# Patient Record
Sex: Male | Born: 1950
Health system: Southern US, Community
[De-identification: ages and names within clinical notes are randomized; demographics above are authoritative.]

## PROBLEM LIST (undated history)

## (undated) DIAGNOSIS — Z720 Tobacco use: Secondary | ICD-10-CM

## (undated) DIAGNOSIS — F419 Anxiety disorder, unspecified: Secondary | ICD-10-CM

## (undated) DIAGNOSIS — R569 Unspecified convulsions: Secondary | ICD-10-CM

## (undated) DIAGNOSIS — I10 Essential (primary) hypertension: Secondary | ICD-10-CM

## (undated) DIAGNOSIS — J449 Chronic obstructive pulmonary disease, unspecified: Secondary | ICD-10-CM

## (undated) DIAGNOSIS — I639 Cerebral infarction, unspecified: Secondary | ICD-10-CM

## (undated) DIAGNOSIS — F329 Major depressive disorder, single episode, unspecified: Secondary | ICD-10-CM

## (undated) DIAGNOSIS — F32A Depression, unspecified: Secondary | ICD-10-CM

## (undated) HISTORY — PX: LUNG REMOVAL, PARTIAL: SHX233

## (undated) HISTORY — PX: BRAIN SURGERY: SHX531

---

## 2013-06-14 DIAGNOSIS — F0390 Unspecified dementia without behavioral disturbance: Secondary | ICD-10-CM | POA: Diagnosis not present

## 2013-07-16 DIAGNOSIS — D509 Iron deficiency anemia, unspecified: Secondary | ICD-10-CM | POA: Diagnosis not present

## 2013-07-16 DIAGNOSIS — K219 Gastro-esophageal reflux disease without esophagitis: Secondary | ICD-10-CM | POA: Diagnosis not present

## 2013-07-16 DIAGNOSIS — R609 Edema, unspecified: Secondary | ICD-10-CM | POA: Diagnosis not present

## 2013-07-16 DIAGNOSIS — F329 Major depressive disorder, single episode, unspecified: Secondary | ICD-10-CM | POA: Diagnosis not present

## 2013-07-16 DIAGNOSIS — F411 Generalized anxiety disorder: Secondary | ICD-10-CM | POA: Diagnosis not present

## 2013-07-16 DIAGNOSIS — G4700 Insomnia, unspecified: Secondary | ICD-10-CM | POA: Diagnosis not present

## 2013-08-17 DIAGNOSIS — K219 Gastro-esophageal reflux disease without esophagitis: Secondary | ICD-10-CM | POA: Diagnosis not present

## 2013-08-17 DIAGNOSIS — G4700 Insomnia, unspecified: Secondary | ICD-10-CM | POA: Diagnosis not present

## 2013-08-17 DIAGNOSIS — D509 Iron deficiency anemia, unspecified: Secondary | ICD-10-CM | POA: Diagnosis not present

## 2013-08-17 DIAGNOSIS — F411 Generalized anxiety disorder: Secondary | ICD-10-CM | POA: Diagnosis not present

## 2013-08-17 DIAGNOSIS — T65291A Toxic effect of other tobacco and nicotine, accidental (unintentional), initial encounter: Secondary | ICD-10-CM | POA: Diagnosis not present

## 2013-08-17 DIAGNOSIS — J309 Allergic rhinitis, unspecified: Secondary | ICD-10-CM | POA: Diagnosis not present

## 2013-08-17 DIAGNOSIS — F329 Major depressive disorder, single episode, unspecified: Secondary | ICD-10-CM | POA: Diagnosis not present

## 2013-08-17 DIAGNOSIS — J449 Chronic obstructive pulmonary disease, unspecified: Secondary | ICD-10-CM | POA: Diagnosis not present

## 2013-10-12 DIAGNOSIS — R5381 Other malaise: Secondary | ICD-10-CM | POA: Diagnosis not present

## 2013-10-12 DIAGNOSIS — R5383 Other fatigue: Secondary | ICD-10-CM | POA: Diagnosis not present

## 2013-12-31 DIAGNOSIS — M2550 Pain in unspecified joint: Secondary | ICD-10-CM | POA: Diagnosis not present

## 2013-12-31 DIAGNOSIS — J449 Chronic obstructive pulmonary disease, unspecified: Secondary | ICD-10-CM | POA: Diagnosis not present

## 2014-02-11 DIAGNOSIS — J449 Chronic obstructive pulmonary disease, unspecified: Secondary | ICD-10-CM | POA: Diagnosis not present

## 2014-03-01 DIAGNOSIS — J449 Chronic obstructive pulmonary disease, unspecified: Secondary | ICD-10-CM | POA: Diagnosis not present

## 2014-03-01 DIAGNOSIS — Z8709 Personal history of other diseases of the respiratory system: Secondary | ICD-10-CM | POA: Diagnosis not present

## 2014-03-01 DIAGNOSIS — F419 Anxiety disorder, unspecified: Secondary | ICD-10-CM | POA: Diagnosis not present

## 2014-03-01 DIAGNOSIS — R0602 Shortness of breath: Secondary | ICD-10-CM | POA: Diagnosis not present

## 2014-03-01 DIAGNOSIS — R0981 Nasal congestion: Secondary | ICD-10-CM | POA: Diagnosis not present

## 2014-03-01 DIAGNOSIS — I10 Essential (primary) hypertension: Secondary | ICD-10-CM | POA: Diagnosis not present

## 2014-03-01 DIAGNOSIS — F17210 Nicotine dependence, cigarettes, uncomplicated: Secondary | ICD-10-CM | POA: Diagnosis not present

## 2014-03-30 DIAGNOSIS — R0600 Dyspnea, unspecified: Secondary | ICD-10-CM | POA: Diagnosis not present

## 2014-03-30 DIAGNOSIS — Z9981 Dependence on supplemental oxygen: Secondary | ICD-10-CM | POA: Diagnosis not present

## 2014-03-30 DIAGNOSIS — I10 Essential (primary) hypertension: Secondary | ICD-10-CM | POA: Diagnosis not present

## 2014-03-30 DIAGNOSIS — Z7951 Long term (current) use of inhaled steroids: Secondary | ICD-10-CM | POA: Diagnosis not present

## 2014-03-30 DIAGNOSIS — J439 Emphysema, unspecified: Secondary | ICD-10-CM | POA: Diagnosis not present

## 2014-03-30 DIAGNOSIS — F419 Anxiety disorder, unspecified: Secondary | ICD-10-CM | POA: Diagnosis not present

## 2014-03-30 DIAGNOSIS — K219 Gastro-esophageal reflux disease without esophagitis: Secondary | ICD-10-CM | POA: Diagnosis not present

## 2014-03-30 DIAGNOSIS — R0981 Nasal congestion: Secondary | ICD-10-CM | POA: Diagnosis not present

## 2014-03-30 DIAGNOSIS — R0602 Shortness of breath: Secondary | ICD-10-CM | POA: Diagnosis not present

## 2014-03-30 DIAGNOSIS — J441 Chronic obstructive pulmonary disease with (acute) exacerbation: Secondary | ICD-10-CM | POA: Diagnosis not present

## 2014-03-30 DIAGNOSIS — F17210 Nicotine dependence, cigarettes, uncomplicated: Secondary | ICD-10-CM | POA: Diagnosis not present

## 2014-03-31 DIAGNOSIS — I10 Essential (primary) hypertension: Secondary | ICD-10-CM | POA: Diagnosis not present

## 2014-03-31 DIAGNOSIS — J441 Chronic obstructive pulmonary disease with (acute) exacerbation: Secondary | ICD-10-CM | POA: Diagnosis not present

## 2014-03-31 DIAGNOSIS — K219 Gastro-esophageal reflux disease without esophagitis: Secondary | ICD-10-CM | POA: Diagnosis not present

## 2014-03-31 DIAGNOSIS — J439 Emphysema, unspecified: Secondary | ICD-10-CM | POA: Diagnosis not present

## 2014-03-31 DIAGNOSIS — J189 Pneumonia, unspecified organism: Secondary | ICD-10-CM | POA: Diagnosis not present

## 2014-04-01 DIAGNOSIS — K219 Gastro-esophageal reflux disease without esophagitis: Secondary | ICD-10-CM | POA: Diagnosis not present

## 2014-04-01 DIAGNOSIS — I10 Essential (primary) hypertension: Secondary | ICD-10-CM | POA: Diagnosis not present

## 2014-04-01 DIAGNOSIS — J441 Chronic obstructive pulmonary disease with (acute) exacerbation: Secondary | ICD-10-CM | POA: Diagnosis not present

## 2014-04-12 DIAGNOSIS — M2550 Pain in unspecified joint: Secondary | ICD-10-CM | POA: Diagnosis not present

## 2014-04-12 DIAGNOSIS — I10 Essential (primary) hypertension: Secondary | ICD-10-CM | POA: Diagnosis not present

## 2014-04-12 DIAGNOSIS — Z720 Tobacco use: Secondary | ICD-10-CM | POA: Diagnosis not present

## 2014-04-12 DIAGNOSIS — J449 Chronic obstructive pulmonary disease, unspecified: Secondary | ICD-10-CM | POA: Diagnosis not present

## 2014-04-16 DIAGNOSIS — Z550 Illiteracy and low-level literacy: Secondary | ICD-10-CM | POA: Diagnosis not present

## 2014-04-16 DIAGNOSIS — F419 Anxiety disorder, unspecified: Secondary | ICD-10-CM | POA: Diagnosis not present

## 2014-04-16 DIAGNOSIS — I10 Essential (primary) hypertension: Secondary | ICD-10-CM | POA: Diagnosis not present

## 2014-04-16 DIAGNOSIS — F17210 Nicotine dependence, cigarettes, uncomplicated: Secondary | ICD-10-CM | POA: Diagnosis not present

## 2014-04-16 DIAGNOSIS — Z9981 Dependence on supplemental oxygen: Secondary | ICD-10-CM | POA: Diagnosis not present

## 2014-04-16 DIAGNOSIS — Z8673 Personal history of transient ischemic attack (TIA), and cerebral infarction without residual deficits: Secondary | ICD-10-CM | POA: Diagnosis not present

## 2014-04-16 DIAGNOSIS — M545 Low back pain: Secondary | ICD-10-CM | POA: Diagnosis not present

## 2014-04-16 DIAGNOSIS — R079 Chest pain, unspecified: Secondary | ICD-10-CM | POA: Diagnosis not present

## 2014-04-16 DIAGNOSIS — J449 Chronic obstructive pulmonary disease, unspecified: Secondary | ICD-10-CM | POA: Diagnosis not present

## 2014-04-16 DIAGNOSIS — Z8249 Family history of ischemic heart disease and other diseases of the circulatory system: Secondary | ICD-10-CM | POA: Diagnosis not present

## 2014-04-30 DIAGNOSIS — F17210 Nicotine dependence, cigarettes, uncomplicated: Secondary | ICD-10-CM | POA: Diagnosis not present

## 2014-04-30 DIAGNOSIS — J449 Chronic obstructive pulmonary disease, unspecified: Secondary | ICD-10-CM | POA: Diagnosis not present

## 2014-04-30 DIAGNOSIS — Z9981 Dependence on supplemental oxygen: Secondary | ICD-10-CM | POA: Diagnosis not present

## 2014-04-30 DIAGNOSIS — I10 Essential (primary) hypertension: Secondary | ICD-10-CM | POA: Diagnosis not present

## 2014-04-30 DIAGNOSIS — M546 Pain in thoracic spine: Secondary | ICD-10-CM | POA: Diagnosis not present

## 2014-04-30 DIAGNOSIS — I4510 Unspecified right bundle-branch block: Secondary | ICD-10-CM | POA: Diagnosis not present

## 2014-04-30 DIAGNOSIS — J441 Chronic obstructive pulmonary disease with (acute) exacerbation: Secondary | ICD-10-CM | POA: Diagnosis not present

## 2014-04-30 DIAGNOSIS — R05 Cough: Secondary | ICD-10-CM | POA: Diagnosis not present

## 2014-05-29 DIAGNOSIS — J449 Chronic obstructive pulmonary disease, unspecified: Secondary | ICD-10-CM | POA: Diagnosis not present

## 2014-05-29 DIAGNOSIS — Z7951 Long term (current) use of inhaled steroids: Secondary | ICD-10-CM | POA: Diagnosis not present

## 2014-05-29 DIAGNOSIS — R0600 Dyspnea, unspecified: Secondary | ICD-10-CM | POA: Diagnosis not present

## 2014-05-29 DIAGNOSIS — I10 Essential (primary) hypertension: Secondary | ICD-10-CM | POA: Diagnosis not present

## 2014-05-29 DIAGNOSIS — Z9981 Dependence on supplemental oxygen: Secondary | ICD-10-CM | POA: Diagnosis not present

## 2014-05-29 DIAGNOSIS — F410 Panic disorder [episodic paroxysmal anxiety] without agoraphobia: Secondary | ICD-10-CM | POA: Diagnosis not present

## 2014-05-29 DIAGNOSIS — J441 Chronic obstructive pulmonary disease with (acute) exacerbation: Secondary | ICD-10-CM | POA: Diagnosis not present

## 2014-05-29 DIAGNOSIS — R531 Weakness: Secondary | ICD-10-CM | POA: Diagnosis not present

## 2014-05-29 DIAGNOSIS — F17210 Nicotine dependence, cigarettes, uncomplicated: Secondary | ICD-10-CM | POA: Diagnosis not present

## 2014-05-29 DIAGNOSIS — R0689 Other abnormalities of breathing: Secondary | ICD-10-CM | POA: Diagnosis not present

## 2014-05-29 DIAGNOSIS — R062 Wheezing: Secondary | ICD-10-CM | POA: Diagnosis not present

## 2014-06-12 DIAGNOSIS — E871 Hypo-osmolality and hyponatremia: Secondary | ICD-10-CM | POA: Diagnosis not present

## 2014-06-12 DIAGNOSIS — J9809 Other diseases of bronchus, not elsewhere classified: Secondary | ICD-10-CM | POA: Diagnosis not present

## 2014-06-12 DIAGNOSIS — J449 Chronic obstructive pulmonary disease, unspecified: Secondary | ICD-10-CM | POA: Diagnosis not present

## 2014-06-12 DIAGNOSIS — I2510 Atherosclerotic heart disease of native coronary artery without angina pectoris: Secondary | ICD-10-CM | POA: Diagnosis not present

## 2014-06-12 DIAGNOSIS — R627 Adult failure to thrive: Secondary | ICD-10-CM | POA: Diagnosis not present

## 2014-06-12 DIAGNOSIS — D509 Iron deficiency anemia, unspecified: Secondary | ICD-10-CM | POA: Diagnosis not present

## 2014-06-12 DIAGNOSIS — J984 Other disorders of lung: Secondary | ICD-10-CM | POA: Diagnosis not present

## 2014-06-12 DIAGNOSIS — R079 Chest pain, unspecified: Secondary | ICD-10-CM | POA: Diagnosis not present

## 2014-06-12 DIAGNOSIS — R262 Difficulty in walking, not elsewhere classified: Secondary | ICD-10-CM | POA: Diagnosis not present

## 2014-06-12 DIAGNOSIS — I10 Essential (primary) hypertension: Secondary | ICD-10-CM | POA: Diagnosis present

## 2014-06-12 DIAGNOSIS — J9690 Respiratory failure, unspecified, unspecified whether with hypoxia or hypercapnia: Secondary | ICD-10-CM | POA: Diagnosis not present

## 2014-06-12 DIAGNOSIS — J9620 Acute and chronic respiratory failure, unspecified whether with hypoxia or hypercapnia: Secondary | ICD-10-CM | POA: Diagnosis not present

## 2014-06-12 DIAGNOSIS — J180 Bronchopneumonia, unspecified organism: Secondary | ICD-10-CM | POA: Diagnosis not present

## 2014-06-12 DIAGNOSIS — R0602 Shortness of breath: Secondary | ICD-10-CM | POA: Diagnosis not present

## 2014-06-12 DIAGNOSIS — M6281 Muscle weakness (generalized): Secondary | ICD-10-CM | POA: Diagnosis not present

## 2014-06-12 DIAGNOSIS — Z79899 Other long term (current) drug therapy: Secondary | ICD-10-CM | POA: Diagnosis not present

## 2014-06-12 DIAGNOSIS — Z7982 Long term (current) use of aspirin: Secondary | ICD-10-CM | POA: Diagnosis not present

## 2014-06-12 DIAGNOSIS — R451 Restlessness and agitation: Secondary | ICD-10-CM | POA: Diagnosis present

## 2014-06-12 DIAGNOSIS — J9602 Acute respiratory failure with hypercapnia: Secondary | ICD-10-CM | POA: Diagnosis not present

## 2014-06-12 DIAGNOSIS — M1990 Unspecified osteoarthritis, unspecified site: Secondary | ICD-10-CM | POA: Diagnosis present

## 2014-06-12 DIAGNOSIS — J441 Chronic obstructive pulmonary disease with (acute) exacerbation: Secondary | ICD-10-CM | POA: Diagnosis not present

## 2014-06-12 DIAGNOSIS — R531 Weakness: Secondary | ICD-10-CM | POA: Diagnosis not present

## 2014-06-12 DIAGNOSIS — F17210 Nicotine dependence, cigarettes, uncomplicated: Secondary | ICD-10-CM | POA: Diagnosis not present

## 2014-06-12 DIAGNOSIS — K219 Gastro-esophageal reflux disease without esophagitis: Secondary | ICD-10-CM | POA: Diagnosis present

## 2014-06-12 DIAGNOSIS — R0600 Dyspnea, unspecified: Secondary | ICD-10-CM | POA: Diagnosis not present

## 2014-06-12 DIAGNOSIS — Z8673 Personal history of transient ischemic attack (TIA), and cerebral infarction without residual deficits: Secondary | ICD-10-CM | POA: Diagnosis not present

## 2014-06-17 DIAGNOSIS — I10 Essential (primary) hypertension: Secondary | ICD-10-CM | POA: Diagnosis not present

## 2014-06-17 DIAGNOSIS — R262 Difficulty in walking, not elsewhere classified: Secondary | ICD-10-CM | POA: Diagnosis not present

## 2014-06-17 DIAGNOSIS — K5900 Constipation, unspecified: Secondary | ICD-10-CM | POA: Diagnosis not present

## 2014-06-17 DIAGNOSIS — K219 Gastro-esophageal reflux disease without esophagitis: Secondary | ICD-10-CM | POA: Diagnosis not present

## 2014-06-17 DIAGNOSIS — J449 Chronic obstructive pulmonary disease, unspecified: Secondary | ICD-10-CM | POA: Diagnosis not present

## 2014-06-17 DIAGNOSIS — Z792 Long term (current) use of antibiotics: Secondary | ICD-10-CM | POA: Diagnosis not present

## 2014-06-17 DIAGNOSIS — R531 Weakness: Secondary | ICD-10-CM | POA: Diagnosis not present

## 2014-06-17 DIAGNOSIS — R0789 Other chest pain: Secondary | ICD-10-CM | POA: Diagnosis not present

## 2014-06-17 DIAGNOSIS — F17210 Nicotine dependence, cigarettes, uncomplicated: Secondary | ICD-10-CM | POA: Diagnosis not present

## 2014-06-17 DIAGNOSIS — G4739 Other sleep apnea: Secondary | ICD-10-CM | POA: Diagnosis not present

## 2014-06-17 DIAGNOSIS — R0600 Dyspnea, unspecified: Secondary | ICD-10-CM | POA: Diagnosis not present

## 2014-06-17 DIAGNOSIS — R451 Restlessness and agitation: Secondary | ICD-10-CM | POA: Diagnosis not present

## 2014-06-17 DIAGNOSIS — D649 Anemia, unspecified: Secondary | ICD-10-CM | POA: Diagnosis not present

## 2014-06-17 DIAGNOSIS — Z7952 Long term (current) use of systemic steroids: Secondary | ICD-10-CM | POA: Diagnosis not present

## 2014-06-17 DIAGNOSIS — R079 Chest pain, unspecified: Secondary | ICD-10-CM | POA: Diagnosis not present

## 2014-06-17 DIAGNOSIS — Z7982 Long term (current) use of aspirin: Secondary | ICD-10-CM | POA: Diagnosis not present

## 2014-06-17 DIAGNOSIS — M6281 Muscle weakness (generalized): Secondary | ICD-10-CM | POA: Diagnosis not present

## 2014-06-17 DIAGNOSIS — D72829 Elevated white blood cell count, unspecified: Secondary | ICD-10-CM | POA: Diagnosis not present

## 2014-06-17 DIAGNOSIS — J9602 Acute respiratory failure with hypercapnia: Secondary | ICD-10-CM | POA: Diagnosis not present

## 2014-06-17 DIAGNOSIS — J9620 Acute and chronic respiratory failure, unspecified whether with hypoxia or hypercapnia: Secondary | ICD-10-CM | POA: Diagnosis not present

## 2014-06-17 DIAGNOSIS — R627 Adult failure to thrive: Secondary | ICD-10-CM | POA: Diagnosis not present

## 2014-06-17 DIAGNOSIS — J9690 Respiratory failure, unspecified, unspecified whether with hypoxia or hypercapnia: Secondary | ICD-10-CM | POA: Diagnosis not present

## 2014-06-17 DIAGNOSIS — J441 Chronic obstructive pulmonary disease with (acute) exacerbation: Secondary | ICD-10-CM | POA: Diagnosis not present

## 2014-06-17 DIAGNOSIS — Z8673 Personal history of transient ischemic attack (TIA), and cerebral infarction without residual deficits: Secondary | ICD-10-CM | POA: Diagnosis not present

## 2014-06-17 DIAGNOSIS — J180 Bronchopneumonia, unspecified organism: Secondary | ICD-10-CM | POA: Diagnosis not present

## 2014-06-17 DIAGNOSIS — D509 Iron deficiency anemia, unspecified: Secondary | ICD-10-CM | POA: Diagnosis not present

## 2014-06-17 DIAGNOSIS — E871 Hypo-osmolality and hyponatremia: Secondary | ICD-10-CM | POA: Diagnosis not present

## 2014-06-17 DIAGNOSIS — Z79899 Other long term (current) drug therapy: Secondary | ICD-10-CM | POA: Diagnosis not present

## 2014-06-17 DIAGNOSIS — J45909 Unspecified asthma, uncomplicated: Secondary | ICD-10-CM | POA: Diagnosis not present

## 2014-06-21 DIAGNOSIS — I10 Essential (primary) hypertension: Secondary | ICD-10-CM | POA: Diagnosis not present

## 2014-06-21 DIAGNOSIS — R079 Chest pain, unspecified: Secondary | ICD-10-CM | POA: Diagnosis not present

## 2014-06-21 DIAGNOSIS — R0789 Other chest pain: Secondary | ICD-10-CM | POA: Diagnosis not present

## 2014-06-21 DIAGNOSIS — Z792 Long term (current) use of antibiotics: Secondary | ICD-10-CM | POA: Diagnosis not present

## 2014-06-21 DIAGNOSIS — J45909 Unspecified asthma, uncomplicated: Secondary | ICD-10-CM | POA: Diagnosis not present

## 2014-06-21 DIAGNOSIS — Z7982 Long term (current) use of aspirin: Secondary | ICD-10-CM | POA: Diagnosis not present

## 2014-06-21 DIAGNOSIS — F17210 Nicotine dependence, cigarettes, uncomplicated: Secondary | ICD-10-CM | POA: Diagnosis not present

## 2014-06-30 DIAGNOSIS — D72829 Elevated white blood cell count, unspecified: Secondary | ICD-10-CM | POA: Diagnosis not present

## 2014-06-30 DIAGNOSIS — D509 Iron deficiency anemia, unspecified: Secondary | ICD-10-CM | POA: Diagnosis not present

## 2014-07-01 DIAGNOSIS — K5900 Constipation, unspecified: Secondary | ICD-10-CM | POA: Diagnosis not present

## 2014-07-07 DIAGNOSIS — K219 Gastro-esophageal reflux disease without esophagitis: Secondary | ICD-10-CM | POA: Diagnosis not present

## 2014-07-07 DIAGNOSIS — J449 Chronic obstructive pulmonary disease, unspecified: Secondary | ICD-10-CM | POA: Diagnosis not present

## 2014-07-07 DIAGNOSIS — G4739 Other sleep apnea: Secondary | ICD-10-CM | POA: Diagnosis not present

## 2014-07-07 DIAGNOSIS — D509 Iron deficiency anemia, unspecified: Secondary | ICD-10-CM | POA: Diagnosis not present

## 2014-07-10 DIAGNOSIS — R627 Adult failure to thrive: Secondary | ICD-10-CM | POA: Diagnosis not present

## 2014-07-10 DIAGNOSIS — Z7952 Long term (current) use of systemic steroids: Secondary | ICD-10-CM | POA: Diagnosis not present

## 2014-07-10 DIAGNOSIS — J180 Bronchopneumonia, unspecified organism: Secondary | ICD-10-CM | POA: Diagnosis not present

## 2014-07-10 DIAGNOSIS — I2510 Atherosclerotic heart disease of native coronary artery without angina pectoris: Secondary | ICD-10-CM | POA: Diagnosis not present

## 2014-07-10 DIAGNOSIS — Z87891 Personal history of nicotine dependence: Secondary | ICD-10-CM | POA: Diagnosis not present

## 2014-07-10 DIAGNOSIS — Z9981 Dependence on supplemental oxygen: Secondary | ICD-10-CM | POA: Diagnosis not present

## 2014-07-10 DIAGNOSIS — J441 Chronic obstructive pulmonary disease with (acute) exacerbation: Secondary | ICD-10-CM | POA: Diagnosis not present

## 2014-07-12 DIAGNOSIS — Z9981 Dependence on supplemental oxygen: Secondary | ICD-10-CM | POA: Diagnosis not present

## 2014-07-12 DIAGNOSIS — I2510 Atherosclerotic heart disease of native coronary artery without angina pectoris: Secondary | ICD-10-CM | POA: Diagnosis not present

## 2014-07-12 DIAGNOSIS — R627 Adult failure to thrive: Secondary | ICD-10-CM | POA: Diagnosis not present

## 2014-07-12 DIAGNOSIS — J441 Chronic obstructive pulmonary disease with (acute) exacerbation: Secondary | ICD-10-CM | POA: Diagnosis not present

## 2014-07-12 DIAGNOSIS — Z7952 Long term (current) use of systemic steroids: Secondary | ICD-10-CM | POA: Diagnosis not present

## 2014-07-12 DIAGNOSIS — J180 Bronchopneumonia, unspecified organism: Secondary | ICD-10-CM | POA: Diagnosis not present

## 2014-07-13 DIAGNOSIS — I2510 Atherosclerotic heart disease of native coronary artery without angina pectoris: Secondary | ICD-10-CM | POA: Diagnosis not present

## 2014-07-13 DIAGNOSIS — Z9981 Dependence on supplemental oxygen: Secondary | ICD-10-CM | POA: Diagnosis not present

## 2014-07-13 DIAGNOSIS — J441 Chronic obstructive pulmonary disease with (acute) exacerbation: Secondary | ICD-10-CM | POA: Diagnosis not present

## 2014-07-13 DIAGNOSIS — J180 Bronchopneumonia, unspecified organism: Secondary | ICD-10-CM | POA: Diagnosis not present

## 2014-07-13 DIAGNOSIS — Z7952 Long term (current) use of systemic steroids: Secondary | ICD-10-CM | POA: Diagnosis not present

## 2014-07-13 DIAGNOSIS — R627 Adult failure to thrive: Secondary | ICD-10-CM | POA: Diagnosis not present

## 2014-07-14 DIAGNOSIS — J180 Bronchopneumonia, unspecified organism: Secondary | ICD-10-CM | POA: Diagnosis not present

## 2014-07-14 DIAGNOSIS — Z7952 Long term (current) use of systemic steroids: Secondary | ICD-10-CM | POA: Diagnosis not present

## 2014-07-14 DIAGNOSIS — J441 Chronic obstructive pulmonary disease with (acute) exacerbation: Secondary | ICD-10-CM | POA: Diagnosis not present

## 2014-07-14 DIAGNOSIS — R627 Adult failure to thrive: Secondary | ICD-10-CM | POA: Diagnosis not present

## 2014-07-14 DIAGNOSIS — I2510 Atherosclerotic heart disease of native coronary artery without angina pectoris: Secondary | ICD-10-CM | POA: Diagnosis not present

## 2014-07-14 DIAGNOSIS — Z9981 Dependence on supplemental oxygen: Secondary | ICD-10-CM | POA: Diagnosis not present

## 2014-07-16 DIAGNOSIS — J180 Bronchopneumonia, unspecified organism: Secondary | ICD-10-CM | POA: Diagnosis not present

## 2014-07-16 DIAGNOSIS — J441 Chronic obstructive pulmonary disease with (acute) exacerbation: Secondary | ICD-10-CM | POA: Diagnosis not present

## 2014-07-16 DIAGNOSIS — Z7952 Long term (current) use of systemic steroids: Secondary | ICD-10-CM | POA: Diagnosis not present

## 2014-07-16 DIAGNOSIS — R627 Adult failure to thrive: Secondary | ICD-10-CM | POA: Diagnosis not present

## 2014-07-16 DIAGNOSIS — Z9981 Dependence on supplemental oxygen: Secondary | ICD-10-CM | POA: Diagnosis not present

## 2014-07-16 DIAGNOSIS — I2510 Atherosclerotic heart disease of native coronary artery without angina pectoris: Secondary | ICD-10-CM | POA: Diagnosis not present

## 2014-07-19 DIAGNOSIS — Z7952 Long term (current) use of systemic steroids: Secondary | ICD-10-CM | POA: Diagnosis not present

## 2014-07-19 DIAGNOSIS — Z9981 Dependence on supplemental oxygen: Secondary | ICD-10-CM | POA: Diagnosis not present

## 2014-07-19 DIAGNOSIS — R627 Adult failure to thrive: Secondary | ICD-10-CM | POA: Diagnosis not present

## 2014-07-19 DIAGNOSIS — I2510 Atherosclerotic heart disease of native coronary artery without angina pectoris: Secondary | ICD-10-CM | POA: Diagnosis not present

## 2014-07-19 DIAGNOSIS — J180 Bronchopneumonia, unspecified organism: Secondary | ICD-10-CM | POA: Diagnosis not present

## 2014-07-19 DIAGNOSIS — J441 Chronic obstructive pulmonary disease with (acute) exacerbation: Secondary | ICD-10-CM | POA: Diagnosis not present

## 2014-07-20 DIAGNOSIS — J180 Bronchopneumonia, unspecified organism: Secondary | ICD-10-CM | POA: Diagnosis not present

## 2014-07-20 DIAGNOSIS — I2510 Atherosclerotic heart disease of native coronary artery without angina pectoris: Secondary | ICD-10-CM | POA: Diagnosis not present

## 2014-07-20 DIAGNOSIS — J441 Chronic obstructive pulmonary disease with (acute) exacerbation: Secondary | ICD-10-CM | POA: Diagnosis not present

## 2014-07-20 DIAGNOSIS — Z7952 Long term (current) use of systemic steroids: Secondary | ICD-10-CM | POA: Diagnosis not present

## 2014-07-20 DIAGNOSIS — Z9981 Dependence on supplemental oxygen: Secondary | ICD-10-CM | POA: Diagnosis not present

## 2014-07-20 DIAGNOSIS — R627 Adult failure to thrive: Secondary | ICD-10-CM | POA: Diagnosis not present

## 2014-07-21 DIAGNOSIS — I2510 Atherosclerotic heart disease of native coronary artery without angina pectoris: Secondary | ICD-10-CM | POA: Diagnosis not present

## 2014-07-21 DIAGNOSIS — Z7952 Long term (current) use of systemic steroids: Secondary | ICD-10-CM | POA: Diagnosis not present

## 2014-07-21 DIAGNOSIS — J180 Bronchopneumonia, unspecified organism: Secondary | ICD-10-CM | POA: Diagnosis not present

## 2014-07-21 DIAGNOSIS — J441 Chronic obstructive pulmonary disease with (acute) exacerbation: Secondary | ICD-10-CM | POA: Diagnosis not present

## 2014-07-21 DIAGNOSIS — R627 Adult failure to thrive: Secondary | ICD-10-CM | POA: Diagnosis not present

## 2014-07-21 DIAGNOSIS — Z9981 Dependence on supplemental oxygen: Secondary | ICD-10-CM | POA: Diagnosis not present

## 2014-07-22 DIAGNOSIS — I2510 Atherosclerotic heart disease of native coronary artery without angina pectoris: Secondary | ICD-10-CM | POA: Diagnosis not present

## 2014-07-22 DIAGNOSIS — J180 Bronchopneumonia, unspecified organism: Secondary | ICD-10-CM | POA: Diagnosis not present

## 2014-07-22 DIAGNOSIS — R627 Adult failure to thrive: Secondary | ICD-10-CM | POA: Diagnosis not present

## 2014-07-22 DIAGNOSIS — Z7952 Long term (current) use of systemic steroids: Secondary | ICD-10-CM | POA: Diagnosis not present

## 2014-07-22 DIAGNOSIS — J441 Chronic obstructive pulmonary disease with (acute) exacerbation: Secondary | ICD-10-CM | POA: Diagnosis not present

## 2014-07-22 DIAGNOSIS — Z9981 Dependence on supplemental oxygen: Secondary | ICD-10-CM | POA: Diagnosis not present

## 2014-07-26 DIAGNOSIS — J449 Chronic obstructive pulmonary disease, unspecified: Secondary | ICD-10-CM | POA: Diagnosis not present

## 2014-07-26 DIAGNOSIS — Z720 Tobacco use: Secondary | ICD-10-CM | POA: Diagnosis not present

## 2014-07-26 DIAGNOSIS — I10 Essential (primary) hypertension: Secondary | ICD-10-CM | POA: Diagnosis not present

## 2014-07-26 DIAGNOSIS — G894 Chronic pain syndrome: Secondary | ICD-10-CM | POA: Diagnosis not present

## 2014-07-27 DIAGNOSIS — Z9981 Dependence on supplemental oxygen: Secondary | ICD-10-CM | POA: Diagnosis not present

## 2014-07-27 DIAGNOSIS — J180 Bronchopneumonia, unspecified organism: Secondary | ICD-10-CM | POA: Diagnosis not present

## 2014-07-27 DIAGNOSIS — R627 Adult failure to thrive: Secondary | ICD-10-CM | POA: Diagnosis not present

## 2014-07-27 DIAGNOSIS — I2510 Atherosclerotic heart disease of native coronary artery without angina pectoris: Secondary | ICD-10-CM | POA: Diagnosis not present

## 2014-07-27 DIAGNOSIS — Z7952 Long term (current) use of systemic steroids: Secondary | ICD-10-CM | POA: Diagnosis not present

## 2014-07-27 DIAGNOSIS — J441 Chronic obstructive pulmonary disease with (acute) exacerbation: Secondary | ICD-10-CM | POA: Diagnosis not present

## 2014-07-28 DIAGNOSIS — R627 Adult failure to thrive: Secondary | ICD-10-CM | POA: Diagnosis not present

## 2014-07-28 DIAGNOSIS — J180 Bronchopneumonia, unspecified organism: Secondary | ICD-10-CM | POA: Diagnosis not present

## 2014-07-28 DIAGNOSIS — I2510 Atherosclerotic heart disease of native coronary artery without angina pectoris: Secondary | ICD-10-CM | POA: Diagnosis not present

## 2014-07-28 DIAGNOSIS — Z9981 Dependence on supplemental oxygen: Secondary | ICD-10-CM | POA: Diagnosis not present

## 2014-07-28 DIAGNOSIS — Z7952 Long term (current) use of systemic steroids: Secondary | ICD-10-CM | POA: Diagnosis not present

## 2014-07-28 DIAGNOSIS — J441 Chronic obstructive pulmonary disease with (acute) exacerbation: Secondary | ICD-10-CM | POA: Diagnosis not present

## 2014-07-29 DIAGNOSIS — J441 Chronic obstructive pulmonary disease with (acute) exacerbation: Secondary | ICD-10-CM | POA: Diagnosis not present

## 2014-07-29 DIAGNOSIS — J180 Bronchopneumonia, unspecified organism: Secondary | ICD-10-CM | POA: Diagnosis not present

## 2014-07-29 DIAGNOSIS — R627 Adult failure to thrive: Secondary | ICD-10-CM | POA: Diagnosis not present

## 2014-07-29 DIAGNOSIS — I2510 Atherosclerotic heart disease of native coronary artery without angina pectoris: Secondary | ICD-10-CM | POA: Diagnosis not present

## 2014-07-29 DIAGNOSIS — Z7952 Long term (current) use of systemic steroids: Secondary | ICD-10-CM | POA: Diagnosis not present

## 2014-07-29 DIAGNOSIS — Z9981 Dependence on supplemental oxygen: Secondary | ICD-10-CM | POA: Diagnosis not present

## 2014-08-03 DIAGNOSIS — Z9981 Dependence on supplemental oxygen: Secondary | ICD-10-CM | POA: Diagnosis not present

## 2014-08-03 DIAGNOSIS — Z7952 Long term (current) use of systemic steroids: Secondary | ICD-10-CM | POA: Diagnosis not present

## 2014-08-03 DIAGNOSIS — J441 Chronic obstructive pulmonary disease with (acute) exacerbation: Secondary | ICD-10-CM | POA: Diagnosis not present

## 2014-08-03 DIAGNOSIS — I2510 Atherosclerotic heart disease of native coronary artery without angina pectoris: Secondary | ICD-10-CM | POA: Diagnosis not present

## 2014-08-03 DIAGNOSIS — J180 Bronchopneumonia, unspecified organism: Secondary | ICD-10-CM | POA: Diagnosis not present

## 2014-08-03 DIAGNOSIS — R627 Adult failure to thrive: Secondary | ICD-10-CM | POA: Diagnosis not present

## 2014-08-05 DIAGNOSIS — J441 Chronic obstructive pulmonary disease with (acute) exacerbation: Secondary | ICD-10-CM | POA: Diagnosis not present

## 2014-08-05 DIAGNOSIS — R627 Adult failure to thrive: Secondary | ICD-10-CM | POA: Diagnosis not present

## 2014-08-05 DIAGNOSIS — J180 Bronchopneumonia, unspecified organism: Secondary | ICD-10-CM | POA: Diagnosis not present

## 2014-08-05 DIAGNOSIS — Z9981 Dependence on supplemental oxygen: Secondary | ICD-10-CM | POA: Diagnosis not present

## 2014-08-05 DIAGNOSIS — I2510 Atherosclerotic heart disease of native coronary artery without angina pectoris: Secondary | ICD-10-CM | POA: Diagnosis not present

## 2014-08-05 DIAGNOSIS — Z7952 Long term (current) use of systemic steroids: Secondary | ICD-10-CM | POA: Diagnosis not present

## 2014-08-07 DIAGNOSIS — D72829 Elevated white blood cell count, unspecified: Secondary | ICD-10-CM | POA: Diagnosis not present

## 2014-08-07 DIAGNOSIS — Z79899 Other long term (current) drug therapy: Secondary | ICD-10-CM | POA: Diagnosis not present

## 2014-08-07 DIAGNOSIS — J439 Emphysema, unspecified: Secondary | ICD-10-CM | POA: Diagnosis not present

## 2014-08-07 DIAGNOSIS — R0602 Shortness of breath: Secondary | ICD-10-CM | POA: Diagnosis not present

## 2014-08-07 DIAGNOSIS — R05 Cough: Secondary | ICD-10-CM | POA: Diagnosis not present

## 2014-08-07 DIAGNOSIS — J45909 Unspecified asthma, uncomplicated: Secondary | ICD-10-CM | POA: Diagnosis not present

## 2014-08-07 DIAGNOSIS — Z452 Encounter for adjustment and management of vascular access device: Secondary | ICD-10-CM | POA: Diagnosis not present

## 2014-08-07 DIAGNOSIS — R918 Other nonspecific abnormal finding of lung field: Secondary | ICD-10-CM | POA: Diagnosis not present

## 2014-08-07 DIAGNOSIS — J9602 Acute respiratory failure with hypercapnia: Secondary | ICD-10-CM | POA: Diagnosis not present

## 2014-08-07 DIAGNOSIS — J449 Chronic obstructive pulmonary disease, unspecified: Secondary | ICD-10-CM | POA: Diagnosis not present

## 2014-08-07 DIAGNOSIS — K219 Gastro-esophageal reflux disease without esophagitis: Secondary | ICD-10-CM | POA: Diagnosis not present

## 2014-08-07 DIAGNOSIS — F17210 Nicotine dependence, cigarettes, uncomplicated: Secondary | ICD-10-CM | POA: Diagnosis present

## 2014-08-07 DIAGNOSIS — I2510 Atherosclerotic heart disease of native coronary artery without angina pectoris: Secondary | ICD-10-CM | POA: Diagnosis not present

## 2014-08-07 DIAGNOSIS — J441 Chronic obstructive pulmonary disease with (acute) exacerbation: Secondary | ICD-10-CM | POA: Diagnosis present

## 2014-08-07 DIAGNOSIS — J9601 Acute respiratory failure with hypoxia: Secondary | ICD-10-CM | POA: Diagnosis not present

## 2014-08-07 DIAGNOSIS — Z716 Tobacco abuse counseling: Secondary | ICD-10-CM | POA: Diagnosis present

## 2014-08-07 DIAGNOSIS — Z8673 Personal history of transient ischemic attack (TIA), and cerebral infarction without residual deficits: Secondary | ICD-10-CM | POA: Diagnosis not present

## 2014-08-12 DIAGNOSIS — J441 Chronic obstructive pulmonary disease with (acute) exacerbation: Secondary | ICD-10-CM | POA: Diagnosis not present

## 2014-08-12 DIAGNOSIS — J180 Bronchopneumonia, unspecified organism: Secondary | ICD-10-CM | POA: Diagnosis not present

## 2014-08-12 DIAGNOSIS — Z9981 Dependence on supplemental oxygen: Secondary | ICD-10-CM | POA: Diagnosis not present

## 2014-08-12 DIAGNOSIS — R627 Adult failure to thrive: Secondary | ICD-10-CM | POA: Diagnosis not present

## 2014-08-12 DIAGNOSIS — I2510 Atherosclerotic heart disease of native coronary artery without angina pectoris: Secondary | ICD-10-CM | POA: Diagnosis not present

## 2014-08-12 DIAGNOSIS — Z7952 Long term (current) use of systemic steroids: Secondary | ICD-10-CM | POA: Diagnosis not present

## 2014-08-17 DIAGNOSIS — J441 Chronic obstructive pulmonary disease with (acute) exacerbation: Secondary | ICD-10-CM | POA: Diagnosis not present

## 2014-08-17 DIAGNOSIS — J180 Bronchopneumonia, unspecified organism: Secondary | ICD-10-CM | POA: Diagnosis not present

## 2014-08-17 DIAGNOSIS — Z9981 Dependence on supplemental oxygen: Secondary | ICD-10-CM | POA: Diagnosis not present

## 2014-08-17 DIAGNOSIS — R627 Adult failure to thrive: Secondary | ICD-10-CM | POA: Diagnosis not present

## 2014-08-17 DIAGNOSIS — I2510 Atherosclerotic heart disease of native coronary artery without angina pectoris: Secondary | ICD-10-CM | POA: Diagnosis not present

## 2014-08-17 DIAGNOSIS — Z7952 Long term (current) use of systemic steroids: Secondary | ICD-10-CM | POA: Diagnosis not present

## 2014-08-19 DIAGNOSIS — Z79899 Other long term (current) drug therapy: Secondary | ICD-10-CM | POA: Diagnosis not present

## 2014-08-19 DIAGNOSIS — J441 Chronic obstructive pulmonary disease with (acute) exacerbation: Secondary | ICD-10-CM | POA: Diagnosis not present

## 2014-08-19 DIAGNOSIS — J45909 Unspecified asthma, uncomplicated: Secondary | ICD-10-CM | POA: Diagnosis not present

## 2014-08-19 DIAGNOSIS — F17200 Nicotine dependence, unspecified, uncomplicated: Secondary | ICD-10-CM | POA: Diagnosis not present

## 2014-08-19 DIAGNOSIS — K219 Gastro-esophageal reflux disease without esophagitis: Secondary | ICD-10-CM | POA: Diagnosis not present

## 2014-08-19 DIAGNOSIS — R0602 Shortness of breath: Secondary | ICD-10-CM | POA: Diagnosis not present

## 2014-08-19 DIAGNOSIS — Z7952 Long term (current) use of systemic steroids: Secondary | ICD-10-CM | POA: Diagnosis not present

## 2014-08-19 DIAGNOSIS — J80 Acute respiratory distress syndrome: Secondary | ICD-10-CM | POA: Diagnosis not present

## 2014-08-19 DIAGNOSIS — J449 Chronic obstructive pulmonary disease, unspecified: Secondary | ICD-10-CM | POA: Diagnosis not present

## 2014-08-19 DIAGNOSIS — F17210 Nicotine dependence, cigarettes, uncomplicated: Secondary | ICD-10-CM | POA: Diagnosis not present

## 2014-08-19 DIAGNOSIS — Z9981 Dependence on supplemental oxygen: Secondary | ICD-10-CM | POA: Diagnosis not present

## 2014-08-19 DIAGNOSIS — K5900 Constipation, unspecified: Secondary | ICD-10-CM | POA: Diagnosis not present

## 2014-08-19 DIAGNOSIS — Z720 Tobacco use: Secondary | ICD-10-CM | POA: Diagnosis not present

## 2014-08-19 DIAGNOSIS — G894 Chronic pain syndrome: Secondary | ICD-10-CM | POA: Diagnosis not present

## 2014-08-19 DIAGNOSIS — F418 Other specified anxiety disorders: Secondary | ICD-10-CM | POA: Diagnosis not present

## 2014-08-19 DIAGNOSIS — I10 Essential (primary) hypertension: Secondary | ICD-10-CM | POA: Diagnosis not present

## 2014-08-20 DIAGNOSIS — Z7952 Long term (current) use of systemic steroids: Secondary | ICD-10-CM | POA: Diagnosis not present

## 2014-08-20 DIAGNOSIS — I2510 Atherosclerotic heart disease of native coronary artery without angina pectoris: Secondary | ICD-10-CM | POA: Diagnosis not present

## 2014-08-20 DIAGNOSIS — Z9981 Dependence on supplemental oxygen: Secondary | ICD-10-CM | POA: Diagnosis not present

## 2014-08-20 DIAGNOSIS — R627 Adult failure to thrive: Secondary | ICD-10-CM | POA: Diagnosis not present

## 2014-08-20 DIAGNOSIS — J180 Bronchopneumonia, unspecified organism: Secondary | ICD-10-CM | POA: Diagnosis not present

## 2014-08-20 DIAGNOSIS — J441 Chronic obstructive pulmonary disease with (acute) exacerbation: Secondary | ICD-10-CM | POA: Diagnosis not present

## 2014-08-23 DIAGNOSIS — R627 Adult failure to thrive: Secondary | ICD-10-CM | POA: Diagnosis not present

## 2014-08-23 DIAGNOSIS — Z7952 Long term (current) use of systemic steroids: Secondary | ICD-10-CM | POA: Diagnosis not present

## 2014-08-23 DIAGNOSIS — I2510 Atherosclerotic heart disease of native coronary artery without angina pectoris: Secondary | ICD-10-CM | POA: Diagnosis not present

## 2014-08-23 DIAGNOSIS — Z9981 Dependence on supplemental oxygen: Secondary | ICD-10-CM | POA: Diagnosis not present

## 2014-08-23 DIAGNOSIS — J180 Bronchopneumonia, unspecified organism: Secondary | ICD-10-CM | POA: Diagnosis not present

## 2014-08-23 DIAGNOSIS — J441 Chronic obstructive pulmonary disease with (acute) exacerbation: Secondary | ICD-10-CM | POA: Diagnosis not present

## 2014-08-25 DIAGNOSIS — Z9981 Dependence on supplemental oxygen: Secondary | ICD-10-CM | POA: Diagnosis not present

## 2014-08-25 DIAGNOSIS — J180 Bronchopneumonia, unspecified organism: Secondary | ICD-10-CM | POA: Diagnosis not present

## 2014-08-25 DIAGNOSIS — R627 Adult failure to thrive: Secondary | ICD-10-CM | POA: Diagnosis not present

## 2014-08-25 DIAGNOSIS — I2510 Atherosclerotic heart disease of native coronary artery without angina pectoris: Secondary | ICD-10-CM | POA: Diagnosis not present

## 2014-08-25 DIAGNOSIS — Z7952 Long term (current) use of systemic steroids: Secondary | ICD-10-CM | POA: Diagnosis not present

## 2014-08-25 DIAGNOSIS — J441 Chronic obstructive pulmonary disease with (acute) exacerbation: Secondary | ICD-10-CM | POA: Diagnosis not present

## 2014-08-27 DIAGNOSIS — Z7952 Long term (current) use of systemic steroids: Secondary | ICD-10-CM | POA: Diagnosis not present

## 2014-08-27 DIAGNOSIS — I2510 Atherosclerotic heart disease of native coronary artery without angina pectoris: Secondary | ICD-10-CM | POA: Diagnosis not present

## 2014-08-27 DIAGNOSIS — R300 Dysuria: Secondary | ICD-10-CM | POA: Diagnosis not present

## 2014-08-27 DIAGNOSIS — J180 Bronchopneumonia, unspecified organism: Secondary | ICD-10-CM | POA: Diagnosis not present

## 2014-08-27 DIAGNOSIS — Z9981 Dependence on supplemental oxygen: Secondary | ICD-10-CM | POA: Diagnosis not present

## 2014-08-27 DIAGNOSIS — J441 Chronic obstructive pulmonary disease with (acute) exacerbation: Secondary | ICD-10-CM | POA: Diagnosis not present

## 2014-08-27 DIAGNOSIS — R627 Adult failure to thrive: Secondary | ICD-10-CM | POA: Diagnosis not present

## 2014-09-01 DIAGNOSIS — Z9981 Dependence on supplemental oxygen: Secondary | ICD-10-CM | POA: Diagnosis not present

## 2014-09-01 DIAGNOSIS — I10 Essential (primary) hypertension: Secondary | ICD-10-CM | POA: Diagnosis not present

## 2014-09-01 DIAGNOSIS — F17200 Nicotine dependence, unspecified, uncomplicated: Secondary | ICD-10-CM | POA: Diagnosis not present

## 2014-09-01 DIAGNOSIS — J441 Chronic obstructive pulmonary disease with (acute) exacerbation: Secondary | ICD-10-CM | POA: Diagnosis not present

## 2014-09-01 DIAGNOSIS — R0600 Dyspnea, unspecified: Secondary | ICD-10-CM | POA: Diagnosis not present

## 2014-09-01 DIAGNOSIS — F17210 Nicotine dependence, cigarettes, uncomplicated: Secondary | ICD-10-CM | POA: Diagnosis not present

## 2014-09-02 DIAGNOSIS — I10 Essential (primary) hypertension: Secondary | ICD-10-CM | POA: Diagnosis not present

## 2014-09-02 DIAGNOSIS — J449 Chronic obstructive pulmonary disease, unspecified: Secondary | ICD-10-CM | POA: Diagnosis not present

## 2014-09-02 DIAGNOSIS — I2510 Atherosclerotic heart disease of native coronary artery without angina pectoris: Secondary | ICD-10-CM | POA: Diagnosis not present

## 2014-09-02 DIAGNOSIS — F17210 Nicotine dependence, cigarettes, uncomplicated: Secondary | ICD-10-CM | POA: Diagnosis not present

## 2014-09-02 DIAGNOSIS — J9611 Chronic respiratory failure with hypoxia: Secondary | ICD-10-CM | POA: Diagnosis not present

## 2014-09-02 DIAGNOSIS — Z7952 Long term (current) use of systemic steroids: Secondary | ICD-10-CM | POA: Diagnosis not present

## 2014-09-02 DIAGNOSIS — R627 Adult failure to thrive: Secondary | ICD-10-CM | POA: Diagnosis not present

## 2014-09-02 DIAGNOSIS — Z9981 Dependence on supplemental oxygen: Secondary | ICD-10-CM | POA: Diagnosis not present

## 2014-09-02 DIAGNOSIS — J180 Bronchopneumonia, unspecified organism: Secondary | ICD-10-CM | POA: Diagnosis not present

## 2014-09-02 DIAGNOSIS — J441 Chronic obstructive pulmonary disease with (acute) exacerbation: Secondary | ICD-10-CM | POA: Diagnosis not present

## 2014-09-03 DIAGNOSIS — J441 Chronic obstructive pulmonary disease with (acute) exacerbation: Secondary | ICD-10-CM | POA: Diagnosis not present

## 2014-09-03 DIAGNOSIS — R627 Adult failure to thrive: Secondary | ICD-10-CM | POA: Diagnosis not present

## 2014-09-03 DIAGNOSIS — Z9981 Dependence on supplemental oxygen: Secondary | ICD-10-CM | POA: Diagnosis not present

## 2014-09-03 DIAGNOSIS — Z7952 Long term (current) use of systemic steroids: Secondary | ICD-10-CM | POA: Diagnosis not present

## 2014-09-03 DIAGNOSIS — J180 Bronchopneumonia, unspecified organism: Secondary | ICD-10-CM | POA: Diagnosis not present

## 2014-09-03 DIAGNOSIS — I2510 Atherosclerotic heart disease of native coronary artery without angina pectoris: Secondary | ICD-10-CM | POA: Diagnosis not present

## 2014-09-05 DIAGNOSIS — J441 Chronic obstructive pulmonary disease with (acute) exacerbation: Secondary | ICD-10-CM | POA: Diagnosis not present

## 2014-09-05 DIAGNOSIS — R0600 Dyspnea, unspecified: Secondary | ICD-10-CM | POA: Diagnosis not present

## 2014-09-05 DIAGNOSIS — R079 Chest pain, unspecified: Secondary | ICD-10-CM | POA: Diagnosis not present

## 2014-09-05 DIAGNOSIS — R05 Cough: Secondary | ICD-10-CM | POA: Diagnosis not present

## 2014-09-05 DIAGNOSIS — J80 Acute respiratory distress syndrome: Secondary | ICD-10-CM | POA: Diagnosis not present

## 2014-09-05 DIAGNOSIS — R0602 Shortness of breath: Secondary | ICD-10-CM | POA: Diagnosis not present

## 2014-09-05 DIAGNOSIS — J449 Chronic obstructive pulmonary disease, unspecified: Secondary | ICD-10-CM | POA: Diagnosis not present

## 2014-09-08 DIAGNOSIS — Z87891 Personal history of nicotine dependence: Secondary | ICD-10-CM | POA: Diagnosis not present

## 2014-09-08 DIAGNOSIS — F419 Anxiety disorder, unspecified: Secondary | ICD-10-CM | POA: Diagnosis not present

## 2014-09-08 DIAGNOSIS — I2510 Atherosclerotic heart disease of native coronary artery without angina pectoris: Secondary | ICD-10-CM | POA: Diagnosis not present

## 2014-09-08 DIAGNOSIS — J180 Bronchopneumonia, unspecified organism: Secondary | ICD-10-CM | POA: Diagnosis not present

## 2014-09-08 DIAGNOSIS — Z9981 Dependence on supplemental oxygen: Secondary | ICD-10-CM | POA: Diagnosis not present

## 2014-09-08 DIAGNOSIS — J441 Chronic obstructive pulmonary disease with (acute) exacerbation: Secondary | ICD-10-CM | POA: Diagnosis not present

## 2014-09-08 DIAGNOSIS — R627 Adult failure to thrive: Secondary | ICD-10-CM | POA: Diagnosis not present

## 2014-09-10 DIAGNOSIS — F17210 Nicotine dependence, cigarettes, uncomplicated: Secondary | ICD-10-CM | POA: Diagnosis not present

## 2014-09-10 DIAGNOSIS — F419 Anxiety disorder, unspecified: Secondary | ICD-10-CM | POA: Diagnosis not present

## 2014-09-10 DIAGNOSIS — R0600 Dyspnea, unspecified: Secondary | ICD-10-CM | POA: Diagnosis not present

## 2014-09-10 DIAGNOSIS — R627 Adult failure to thrive: Secondary | ICD-10-CM | POA: Diagnosis not present

## 2014-09-10 DIAGNOSIS — Z9981 Dependence on supplemental oxygen: Secondary | ICD-10-CM | POA: Diagnosis not present

## 2014-09-10 DIAGNOSIS — I10 Essential (primary) hypertension: Secondary | ICD-10-CM | POA: Diagnosis not present

## 2014-09-10 DIAGNOSIS — J180 Bronchopneumonia, unspecified organism: Secondary | ICD-10-CM | POA: Diagnosis not present

## 2014-09-10 DIAGNOSIS — I2510 Atherosclerotic heart disease of native coronary artery without angina pectoris: Secondary | ICD-10-CM | POA: Diagnosis not present

## 2014-09-10 DIAGNOSIS — J441 Chronic obstructive pulmonary disease with (acute) exacerbation: Secondary | ICD-10-CM | POA: Diagnosis not present

## 2014-09-10 DIAGNOSIS — J449 Chronic obstructive pulmonary disease, unspecified: Secondary | ICD-10-CM | POA: Diagnosis not present

## 2014-09-14 DIAGNOSIS — F419 Anxiety disorder, unspecified: Secondary | ICD-10-CM | POA: Diagnosis not present

## 2014-09-14 DIAGNOSIS — J180 Bronchopneumonia, unspecified organism: Secondary | ICD-10-CM | POA: Diagnosis not present

## 2014-09-14 DIAGNOSIS — J441 Chronic obstructive pulmonary disease with (acute) exacerbation: Secondary | ICD-10-CM | POA: Diagnosis not present

## 2014-09-14 DIAGNOSIS — R627 Adult failure to thrive: Secondary | ICD-10-CM | POA: Diagnosis not present

## 2014-09-14 DIAGNOSIS — Z9981 Dependence on supplemental oxygen: Secondary | ICD-10-CM | POA: Diagnosis not present

## 2014-09-14 DIAGNOSIS — I2510 Atherosclerotic heart disease of native coronary artery without angina pectoris: Secondary | ICD-10-CM | POA: Diagnosis not present

## 2014-09-15 DIAGNOSIS — J984 Other disorders of lung: Secondary | ICD-10-CM | POA: Diagnosis not present

## 2014-09-15 DIAGNOSIS — F17210 Nicotine dependence, cigarettes, uncomplicated: Secondary | ICD-10-CM | POA: Diagnosis not present

## 2014-09-15 DIAGNOSIS — F419 Anxiety disorder, unspecified: Secondary | ICD-10-CM | POA: Diagnosis not present

## 2014-09-15 DIAGNOSIS — J449 Chronic obstructive pulmonary disease, unspecified: Secondary | ICD-10-CM | POA: Diagnosis not present

## 2014-09-15 DIAGNOSIS — I10 Essential (primary) hypertension: Secondary | ICD-10-CM | POA: Diagnosis not present

## 2014-09-19 DIAGNOSIS — Z720 Tobacco use: Secondary | ICD-10-CM | POA: Diagnosis not present

## 2014-09-19 DIAGNOSIS — J449 Chronic obstructive pulmonary disease, unspecified: Secondary | ICD-10-CM | POA: Diagnosis not present

## 2014-09-19 DIAGNOSIS — R0600 Dyspnea, unspecified: Secondary | ICD-10-CM | POA: Diagnosis not present

## 2014-09-19 DIAGNOSIS — I10 Essential (primary) hypertension: Secondary | ICD-10-CM | POA: Diagnosis not present

## 2014-09-19 DIAGNOSIS — F17210 Nicotine dependence, cigarettes, uncomplicated: Secondary | ICD-10-CM | POA: Diagnosis not present

## 2014-09-19 DIAGNOSIS — F419 Anxiety disorder, unspecified: Secondary | ICD-10-CM | POA: Diagnosis not present

## 2014-09-19 DIAGNOSIS — R0602 Shortness of breath: Secondary | ICD-10-CM | POA: Diagnosis not present

## 2014-09-20 DIAGNOSIS — J9612 Chronic respiratory failure with hypercapnia: Secondary | ICD-10-CM | POA: Diagnosis not present

## 2014-09-20 DIAGNOSIS — F411 Generalized anxiety disorder: Secondary | ICD-10-CM | POA: Diagnosis not present

## 2014-09-20 DIAGNOSIS — J189 Pneumonia, unspecified organism: Secondary | ICD-10-CM | POA: Diagnosis not present

## 2014-09-20 DIAGNOSIS — R0602 Shortness of breath: Secondary | ICD-10-CM | POA: Diagnosis not present

## 2014-09-20 DIAGNOSIS — Z7952 Long term (current) use of systemic steroids: Secondary | ICD-10-CM | POA: Diagnosis not present

## 2014-09-20 DIAGNOSIS — J449 Chronic obstructive pulmonary disease, unspecified: Secondary | ICD-10-CM | POA: Diagnosis not present

## 2014-09-20 DIAGNOSIS — F418 Other specified anxiety disorders: Secondary | ICD-10-CM | POA: Diagnosis not present

## 2014-09-20 DIAGNOSIS — J80 Acute respiratory distress syndrome: Secondary | ICD-10-CM | POA: Diagnosis not present

## 2014-09-20 DIAGNOSIS — F17210 Nicotine dependence, cigarettes, uncomplicated: Secondary | ICD-10-CM | POA: Diagnosis not present

## 2014-09-20 DIAGNOSIS — J45909 Unspecified asthma, uncomplicated: Secondary | ICD-10-CM | POA: Diagnosis not present

## 2014-09-20 DIAGNOSIS — I10 Essential (primary) hypertension: Secondary | ICD-10-CM | POA: Diagnosis not present

## 2014-09-20 DIAGNOSIS — R079 Chest pain, unspecified: Secondary | ICD-10-CM | POA: Diagnosis not present

## 2014-09-20 DIAGNOSIS — Z79899 Other long term (current) drug therapy: Secondary | ICD-10-CM | POA: Diagnosis not present

## 2014-09-20 DIAGNOSIS — Z9981 Dependence on supplemental oxygen: Secondary | ICD-10-CM | POA: Diagnosis not present

## 2014-09-20 DIAGNOSIS — F419 Anxiety disorder, unspecified: Secondary | ICD-10-CM | POA: Diagnosis not present

## 2014-09-20 DIAGNOSIS — R05 Cough: Secondary | ICD-10-CM | POA: Diagnosis not present

## 2014-09-20 DIAGNOSIS — R0600 Dyspnea, unspecified: Secondary | ICD-10-CM | POA: Diagnosis not present

## 2014-09-21 DIAGNOSIS — R0602 Shortness of breath: Secondary | ICD-10-CM | POA: Diagnosis not present

## 2014-09-21 DIAGNOSIS — F419 Anxiety disorder, unspecified: Secondary | ICD-10-CM | POA: Diagnosis not present

## 2014-09-21 DIAGNOSIS — J189 Pneumonia, unspecified organism: Secondary | ICD-10-CM | POA: Diagnosis not present

## 2014-09-23 DIAGNOSIS — J441 Chronic obstructive pulmonary disease with (acute) exacerbation: Secondary | ICD-10-CM | POA: Diagnosis not present

## 2014-09-23 DIAGNOSIS — I2510 Atherosclerotic heart disease of native coronary artery without angina pectoris: Secondary | ICD-10-CM | POA: Diagnosis not present

## 2014-09-23 DIAGNOSIS — Z9981 Dependence on supplemental oxygen: Secondary | ICD-10-CM | POA: Diagnosis not present

## 2014-09-23 DIAGNOSIS — R627 Adult failure to thrive: Secondary | ICD-10-CM | POA: Diagnosis not present

## 2014-09-23 DIAGNOSIS — J180 Bronchopneumonia, unspecified organism: Secondary | ICD-10-CM | POA: Diagnosis not present

## 2014-09-23 DIAGNOSIS — F419 Anxiety disorder, unspecified: Secondary | ICD-10-CM | POA: Diagnosis not present

## 2014-09-25 DIAGNOSIS — J440 Chronic obstructive pulmonary disease with acute lower respiratory infection: Secondary | ICD-10-CM | POA: Diagnosis not present

## 2014-09-25 DIAGNOSIS — K5900 Constipation, unspecified: Secondary | ICD-10-CM | POA: Diagnosis not present

## 2014-09-29 DIAGNOSIS — M545 Low back pain: Secondary | ICD-10-CM | POA: Diagnosis not present

## 2014-09-29 DIAGNOSIS — G894 Chronic pain syndrome: Secondary | ICD-10-CM | POA: Diagnosis not present

## 2014-09-30 DIAGNOSIS — Z9981 Dependence on supplemental oxygen: Secondary | ICD-10-CM | POA: Diagnosis not present

## 2014-09-30 DIAGNOSIS — I2510 Atherosclerotic heart disease of native coronary artery without angina pectoris: Secondary | ICD-10-CM | POA: Diagnosis not present

## 2014-09-30 DIAGNOSIS — F419 Anxiety disorder, unspecified: Secondary | ICD-10-CM | POA: Diagnosis not present

## 2014-09-30 DIAGNOSIS — J180 Bronchopneumonia, unspecified organism: Secondary | ICD-10-CM | POA: Diagnosis not present

## 2014-09-30 DIAGNOSIS — J441 Chronic obstructive pulmonary disease with (acute) exacerbation: Secondary | ICD-10-CM | POA: Diagnosis not present

## 2014-09-30 DIAGNOSIS — R627 Adult failure to thrive: Secondary | ICD-10-CM | POA: Diagnosis not present

## 2014-10-02 DIAGNOSIS — E278 Other specified disorders of adrenal gland: Secondary | ICD-10-CM | POA: Diagnosis not present

## 2014-10-02 DIAGNOSIS — K5909 Other constipation: Secondary | ICD-10-CM | POA: Diagnosis not present

## 2014-10-02 DIAGNOSIS — R0600 Dyspnea, unspecified: Secondary | ICD-10-CM | POA: Diagnosis not present

## 2014-10-02 DIAGNOSIS — N281 Cyst of kidney, acquired: Secondary | ICD-10-CM | POA: Diagnosis not present

## 2014-10-02 DIAGNOSIS — R0602 Shortness of breath: Secondary | ICD-10-CM | POA: Diagnosis not present

## 2014-10-04 DIAGNOSIS — J449 Chronic obstructive pulmonary disease, unspecified: Secondary | ICD-10-CM | POA: Diagnosis not present

## 2014-10-04 DIAGNOSIS — K5901 Slow transit constipation: Secondary | ICD-10-CM | POA: Diagnosis not present

## 2014-10-04 DIAGNOSIS — Z8673 Personal history of transient ischemic attack (TIA), and cerebral infarction without residual deficits: Secondary | ICD-10-CM | POA: Diagnosis not present

## 2014-10-04 DIAGNOSIS — Z87891 Personal history of nicotine dependence: Secondary | ICD-10-CM | POA: Diagnosis not present

## 2014-10-04 DIAGNOSIS — R0602 Shortness of breath: Secondary | ICD-10-CM | POA: Diagnosis not present

## 2014-10-04 DIAGNOSIS — I10 Essential (primary) hypertension: Secondary | ICD-10-CM | POA: Diagnosis not present

## 2014-10-04 DIAGNOSIS — K5900 Constipation, unspecified: Secondary | ICD-10-CM | POA: Diagnosis not present

## 2014-10-05 DIAGNOSIS — J45998 Other asthma: Secondary | ICD-10-CM | POA: Diagnosis not present

## 2014-10-05 DIAGNOSIS — Z9981 Dependence on supplemental oxygen: Secondary | ICD-10-CM | POA: Diagnosis not present

## 2014-10-05 DIAGNOSIS — J449 Chronic obstructive pulmonary disease, unspecified: Secondary | ICD-10-CM | POA: Diagnosis not present

## 2014-10-05 DIAGNOSIS — R0602 Shortness of breath: Secondary | ICD-10-CM | POA: Diagnosis not present

## 2014-10-05 DIAGNOSIS — F17210 Nicotine dependence, cigarettes, uncomplicated: Secondary | ICD-10-CM | POA: Diagnosis not present

## 2014-10-05 DIAGNOSIS — I10 Essential (primary) hypertension: Secondary | ICD-10-CM | POA: Diagnosis not present

## 2014-10-05 DIAGNOSIS — Z79899 Other long term (current) drug therapy: Secondary | ICD-10-CM | POA: Diagnosis not present

## 2014-10-05 DIAGNOSIS — K5900 Constipation, unspecified: Secondary | ICD-10-CM | POA: Diagnosis not present

## 2014-10-07 DIAGNOSIS — Z87891 Personal history of nicotine dependence: Secondary | ICD-10-CM | POA: Diagnosis not present

## 2014-10-07 DIAGNOSIS — I10 Essential (primary) hypertension: Secondary | ICD-10-CM | POA: Diagnosis not present

## 2014-10-07 DIAGNOSIS — R079 Chest pain, unspecified: Secondary | ICD-10-CM | POA: Diagnosis not present

## 2014-10-07 DIAGNOSIS — R0600 Dyspnea, unspecified: Secondary | ICD-10-CM | POA: Diagnosis not present

## 2014-10-07 DIAGNOSIS — R071 Chest pain on breathing: Secondary | ICD-10-CM | POA: Diagnosis not present

## 2014-10-07 DIAGNOSIS — J449 Chronic obstructive pulmonary disease, unspecified: Secondary | ICD-10-CM | POA: Diagnosis not present

## 2014-10-07 DIAGNOSIS — Z8249 Family history of ischemic heart disease and other diseases of the circulatory system: Secondary | ICD-10-CM | POA: Diagnosis not present

## 2014-10-07 DIAGNOSIS — Z8673 Personal history of transient ischemic attack (TIA), and cerebral infarction without residual deficits: Secondary | ICD-10-CM | POA: Diagnosis not present

## 2014-10-07 DIAGNOSIS — R0602 Shortness of breath: Secondary | ICD-10-CM | POA: Diagnosis not present

## 2014-10-11 DIAGNOSIS — J441 Chronic obstructive pulmonary disease with (acute) exacerbation: Secondary | ICD-10-CM | POA: Diagnosis not present

## 2014-10-11 DIAGNOSIS — Z8673 Personal history of transient ischemic attack (TIA), and cerebral infarction without residual deficits: Secondary | ICD-10-CM | POA: Diagnosis not present

## 2014-10-11 DIAGNOSIS — R51 Headache: Secondary | ICD-10-CM | POA: Diagnosis not present

## 2014-10-11 DIAGNOSIS — Z87891 Personal history of nicotine dependence: Secondary | ICD-10-CM | POA: Diagnosis not present

## 2014-10-11 DIAGNOSIS — R0602 Shortness of breath: Secondary | ICD-10-CM | POA: Diagnosis not present

## 2014-10-11 DIAGNOSIS — I10 Essential (primary) hypertension: Secondary | ICD-10-CM | POA: Diagnosis not present

## 2014-10-11 DIAGNOSIS — J449 Chronic obstructive pulmonary disease, unspecified: Secondary | ICD-10-CM | POA: Diagnosis not present

## 2014-10-13 DIAGNOSIS — D649 Anemia, unspecified: Secondary | ICD-10-CM | POA: Diagnosis not present

## 2014-10-13 DIAGNOSIS — R918 Other nonspecific abnormal finding of lung field: Secondary | ICD-10-CM | POA: Diagnosis not present

## 2014-10-13 DIAGNOSIS — S2241XS Multiple fractures of ribs, right side, sequela: Secondary | ICD-10-CM | POA: Diagnosis not present

## 2014-10-13 DIAGNOSIS — D509 Iron deficiency anemia, unspecified: Secondary | ICD-10-CM | POA: Diagnosis present

## 2014-10-13 DIAGNOSIS — J441 Chronic obstructive pulmonary disease with (acute) exacerbation: Secondary | ICD-10-CM | POA: Diagnosis not present

## 2014-10-13 DIAGNOSIS — J9612 Chronic respiratory failure with hypercapnia: Secondary | ICD-10-CM | POA: Diagnosis not present

## 2014-10-13 DIAGNOSIS — I455 Other specified heart block: Secondary | ICD-10-CM | POA: Diagnosis not present

## 2014-10-13 DIAGNOSIS — R0600 Dyspnea, unspecified: Secondary | ICD-10-CM | POA: Diagnosis not present

## 2014-10-13 DIAGNOSIS — F419 Anxiety disorder, unspecified: Secondary | ICD-10-CM | POA: Diagnosis not present

## 2014-10-13 DIAGNOSIS — M5430 Sciatica, unspecified side: Secondary | ICD-10-CM | POA: Diagnosis present

## 2014-10-13 DIAGNOSIS — G8929 Other chronic pain: Secondary | ICD-10-CM | POA: Diagnosis present

## 2014-10-13 DIAGNOSIS — Z87891 Personal history of nicotine dependence: Secondary | ICD-10-CM | POA: Diagnosis not present

## 2014-10-13 DIAGNOSIS — Z8719 Personal history of other diseases of the digestive system: Secondary | ICD-10-CM | POA: Diagnosis not present

## 2014-10-13 DIAGNOSIS — Z8673 Personal history of transient ischemic attack (TIA), and cerebral infarction without residual deficits: Secondary | ICD-10-CM | POA: Diagnosis not present

## 2014-10-13 DIAGNOSIS — I10 Essential (primary) hypertension: Secondary | ICD-10-CM | POA: Diagnosis present

## 2014-10-13 DIAGNOSIS — R0602 Shortness of breath: Secondary | ICD-10-CM | POA: Diagnosis not present

## 2014-10-13 DIAGNOSIS — J449 Chronic obstructive pulmonary disease, unspecified: Secondary | ICD-10-CM | POA: Diagnosis not present

## 2014-10-17 DIAGNOSIS — I10 Essential (primary) hypertension: Secondary | ICD-10-CM | POA: Diagnosis not present

## 2014-10-17 DIAGNOSIS — F419 Anxiety disorder, unspecified: Secondary | ICD-10-CM | POA: Diagnosis not present

## 2014-10-17 DIAGNOSIS — J449 Chronic obstructive pulmonary disease, unspecified: Secondary | ICD-10-CM | POA: Diagnosis not present

## 2014-10-17 DIAGNOSIS — Z8673 Personal history of transient ischemic attack (TIA), and cerebral infarction without residual deficits: Secondary | ICD-10-CM | POA: Diagnosis not present

## 2014-10-17 DIAGNOSIS — Z87891 Personal history of nicotine dependence: Secondary | ICD-10-CM | POA: Diagnosis not present

## 2014-10-18 DIAGNOSIS — F419 Anxiety disorder, unspecified: Secondary | ICD-10-CM | POA: Diagnosis not present

## 2014-10-18 DIAGNOSIS — J449 Chronic obstructive pulmonary disease, unspecified: Secondary | ICD-10-CM | POA: Diagnosis not present

## 2014-10-18 DIAGNOSIS — Z8673 Personal history of transient ischemic attack (TIA), and cerebral infarction without residual deficits: Secondary | ICD-10-CM | POA: Diagnosis not present

## 2014-10-18 DIAGNOSIS — I10 Essential (primary) hypertension: Secondary | ICD-10-CM | POA: Diagnosis not present

## 2014-10-18 DIAGNOSIS — Z87891 Personal history of nicotine dependence: Secondary | ICD-10-CM | POA: Diagnosis not present

## 2014-10-19 DIAGNOSIS — J441 Chronic obstructive pulmonary disease with (acute) exacerbation: Secondary | ICD-10-CM | POA: Diagnosis not present

## 2014-10-19 DIAGNOSIS — I2510 Atherosclerotic heart disease of native coronary artery without angina pectoris: Secondary | ICD-10-CM | POA: Diagnosis not present

## 2014-10-19 DIAGNOSIS — Z9981 Dependence on supplemental oxygen: Secondary | ICD-10-CM | POA: Diagnosis not present

## 2014-10-19 DIAGNOSIS — R627 Adult failure to thrive: Secondary | ICD-10-CM | POA: Diagnosis not present

## 2014-10-19 DIAGNOSIS — F419 Anxiety disorder, unspecified: Secondary | ICD-10-CM | POA: Diagnosis not present

## 2014-10-19 DIAGNOSIS — J180 Bronchopneumonia, unspecified organism: Secondary | ICD-10-CM | POA: Diagnosis not present

## 2014-10-20 DIAGNOSIS — J441 Chronic obstructive pulmonary disease with (acute) exacerbation: Secondary | ICD-10-CM | POA: Diagnosis not present

## 2014-10-20 DIAGNOSIS — K5900 Constipation, unspecified: Secondary | ICD-10-CM | POA: Diagnosis not present

## 2014-10-20 DIAGNOSIS — I10 Essential (primary) hypertension: Secondary | ICD-10-CM | POA: Diagnosis not present

## 2014-10-20 DIAGNOSIS — R627 Adult failure to thrive: Secondary | ICD-10-CM | POA: Diagnosis not present

## 2014-10-20 DIAGNOSIS — R0789 Other chest pain: Secondary | ICD-10-CM | POA: Diagnosis not present

## 2014-10-20 DIAGNOSIS — J449 Chronic obstructive pulmonary disease, unspecified: Secondary | ICD-10-CM | POA: Diagnosis not present

## 2014-10-20 DIAGNOSIS — Z9981 Dependence on supplemental oxygen: Secondary | ICD-10-CM | POA: Diagnosis not present

## 2014-10-20 DIAGNOSIS — J180 Bronchopneumonia, unspecified organism: Secondary | ICD-10-CM | POA: Diagnosis not present

## 2014-10-20 DIAGNOSIS — F419 Anxiety disorder, unspecified: Secondary | ICD-10-CM | POA: Diagnosis not present

## 2014-10-20 DIAGNOSIS — I2510 Atherosclerotic heart disease of native coronary artery without angina pectoris: Secondary | ICD-10-CM | POA: Diagnosis not present

## 2014-10-21 DIAGNOSIS — Z9981 Dependence on supplemental oxygen: Secondary | ICD-10-CM | POA: Diagnosis not present

## 2014-10-21 DIAGNOSIS — R627 Adult failure to thrive: Secondary | ICD-10-CM | POA: Diagnosis not present

## 2014-10-21 DIAGNOSIS — I2510 Atherosclerotic heart disease of native coronary artery without angina pectoris: Secondary | ICD-10-CM | POA: Diagnosis not present

## 2014-10-21 DIAGNOSIS — J180 Bronchopneumonia, unspecified organism: Secondary | ICD-10-CM | POA: Diagnosis not present

## 2014-10-21 DIAGNOSIS — F419 Anxiety disorder, unspecified: Secondary | ICD-10-CM | POA: Diagnosis not present

## 2014-10-21 DIAGNOSIS — J441 Chronic obstructive pulmonary disease with (acute) exacerbation: Secondary | ICD-10-CM | POA: Diagnosis not present

## 2014-10-26 DIAGNOSIS — J441 Chronic obstructive pulmonary disease with (acute) exacerbation: Secondary | ICD-10-CM | POA: Diagnosis not present

## 2014-10-26 DIAGNOSIS — I2510 Atherosclerotic heart disease of native coronary artery without angina pectoris: Secondary | ICD-10-CM | POA: Diagnosis not present

## 2014-10-26 DIAGNOSIS — F419 Anxiety disorder, unspecified: Secondary | ICD-10-CM | POA: Diagnosis not present

## 2014-10-26 DIAGNOSIS — J180 Bronchopneumonia, unspecified organism: Secondary | ICD-10-CM | POA: Diagnosis not present

## 2014-10-26 DIAGNOSIS — R627 Adult failure to thrive: Secondary | ICD-10-CM | POA: Diagnosis not present

## 2014-10-26 DIAGNOSIS — Z9981 Dependence on supplemental oxygen: Secondary | ICD-10-CM | POA: Diagnosis not present

## 2014-10-27 DIAGNOSIS — J180 Bronchopneumonia, unspecified organism: Secondary | ICD-10-CM | POA: Diagnosis not present

## 2014-10-27 DIAGNOSIS — I2510 Atherosclerotic heart disease of native coronary artery without angina pectoris: Secondary | ICD-10-CM | POA: Diagnosis not present

## 2014-10-27 DIAGNOSIS — Z9981 Dependence on supplemental oxygen: Secondary | ICD-10-CM | POA: Diagnosis not present

## 2014-10-27 DIAGNOSIS — R627 Adult failure to thrive: Secondary | ICD-10-CM | POA: Diagnosis not present

## 2014-10-27 DIAGNOSIS — J441 Chronic obstructive pulmonary disease with (acute) exacerbation: Secondary | ICD-10-CM | POA: Diagnosis not present

## 2014-10-27 DIAGNOSIS — F419 Anxiety disorder, unspecified: Secondary | ICD-10-CM | POA: Diagnosis not present

## 2014-10-28 DIAGNOSIS — J441 Chronic obstructive pulmonary disease with (acute) exacerbation: Secondary | ICD-10-CM | POA: Diagnosis not present

## 2014-10-28 DIAGNOSIS — R627 Adult failure to thrive: Secondary | ICD-10-CM | POA: Diagnosis not present

## 2014-10-28 DIAGNOSIS — I2510 Atherosclerotic heart disease of native coronary artery without angina pectoris: Secondary | ICD-10-CM | POA: Diagnosis not present

## 2014-10-28 DIAGNOSIS — Z9981 Dependence on supplemental oxygen: Secondary | ICD-10-CM | POA: Diagnosis not present

## 2014-10-28 DIAGNOSIS — J180 Bronchopneumonia, unspecified organism: Secondary | ICD-10-CM | POA: Diagnosis not present

## 2014-10-28 DIAGNOSIS — F419 Anxiety disorder, unspecified: Secondary | ICD-10-CM | POA: Diagnosis not present

## 2014-10-29 DIAGNOSIS — R220 Localized swelling, mass and lump, head: Secondary | ICD-10-CM | POA: Diagnosis not present

## 2014-10-29 DIAGNOSIS — J180 Bronchopneumonia, unspecified organism: Secondary | ICD-10-CM | POA: Diagnosis not present

## 2014-10-29 DIAGNOSIS — J441 Chronic obstructive pulmonary disease with (acute) exacerbation: Secondary | ICD-10-CM | POA: Diagnosis not present

## 2014-10-29 DIAGNOSIS — I2510 Atherosclerotic heart disease of native coronary artery without angina pectoris: Secondary | ICD-10-CM | POA: Diagnosis not present

## 2014-10-29 DIAGNOSIS — F419 Anxiety disorder, unspecified: Secondary | ICD-10-CM | POA: Diagnosis not present

## 2014-10-29 DIAGNOSIS — R627 Adult failure to thrive: Secondary | ICD-10-CM | POA: Diagnosis not present

## 2014-10-29 DIAGNOSIS — Z9981 Dependence on supplemental oxygen: Secondary | ICD-10-CM | POA: Diagnosis not present

## 2014-11-01 DIAGNOSIS — J441 Chronic obstructive pulmonary disease with (acute) exacerbation: Secondary | ICD-10-CM | POA: Diagnosis not present

## 2014-11-01 DIAGNOSIS — R627 Adult failure to thrive: Secondary | ICD-10-CM | POA: Diagnosis not present

## 2014-11-01 DIAGNOSIS — F419 Anxiety disorder, unspecified: Secondary | ICD-10-CM | POA: Diagnosis not present

## 2014-11-01 DIAGNOSIS — Z9981 Dependence on supplemental oxygen: Secondary | ICD-10-CM | POA: Diagnosis not present

## 2014-11-01 DIAGNOSIS — I2510 Atherosclerotic heart disease of native coronary artery without angina pectoris: Secondary | ICD-10-CM | POA: Diagnosis not present

## 2014-11-01 DIAGNOSIS — J180 Bronchopneumonia, unspecified organism: Secondary | ICD-10-CM | POA: Diagnosis not present

## 2014-11-02 DIAGNOSIS — J441 Chronic obstructive pulmonary disease with (acute) exacerbation: Secondary | ICD-10-CM | POA: Diagnosis not present

## 2014-11-02 DIAGNOSIS — I2510 Atherosclerotic heart disease of native coronary artery without angina pectoris: Secondary | ICD-10-CM | POA: Diagnosis not present

## 2014-11-02 DIAGNOSIS — J180 Bronchopneumonia, unspecified organism: Secondary | ICD-10-CM | POA: Diagnosis not present

## 2014-11-02 DIAGNOSIS — Z9981 Dependence on supplemental oxygen: Secondary | ICD-10-CM | POA: Diagnosis not present

## 2014-11-02 DIAGNOSIS — R627 Adult failure to thrive: Secondary | ICD-10-CM | POA: Diagnosis not present

## 2014-11-02 DIAGNOSIS — F419 Anxiety disorder, unspecified: Secondary | ICD-10-CM | POA: Diagnosis not present

## 2014-11-03 DIAGNOSIS — K5900 Constipation, unspecified: Secondary | ICD-10-CM | POA: Diagnosis not present

## 2014-11-03 DIAGNOSIS — R627 Adult failure to thrive: Secondary | ICD-10-CM | POA: Diagnosis not present

## 2014-11-03 DIAGNOSIS — Z9981 Dependence on supplemental oxygen: Secondary | ICD-10-CM | POA: Diagnosis not present

## 2014-11-03 DIAGNOSIS — F419 Anxiety disorder, unspecified: Secondary | ICD-10-CM | POA: Diagnosis not present

## 2014-11-03 DIAGNOSIS — I2510 Atherosclerotic heart disease of native coronary artery without angina pectoris: Secondary | ICD-10-CM | POA: Diagnosis not present

## 2014-11-03 DIAGNOSIS — J441 Chronic obstructive pulmonary disease with (acute) exacerbation: Secondary | ICD-10-CM | POA: Diagnosis not present

## 2014-11-03 DIAGNOSIS — J180 Bronchopneumonia, unspecified organism: Secondary | ICD-10-CM | POA: Diagnosis not present

## 2014-11-03 DIAGNOSIS — M79673 Pain in unspecified foot: Secondary | ICD-10-CM | POA: Diagnosis not present

## 2014-11-04 DIAGNOSIS — R627 Adult failure to thrive: Secondary | ICD-10-CM | POA: Diagnosis not present

## 2014-11-04 DIAGNOSIS — Z9981 Dependence on supplemental oxygen: Secondary | ICD-10-CM | POA: Diagnosis not present

## 2014-11-04 DIAGNOSIS — J180 Bronchopneumonia, unspecified organism: Secondary | ICD-10-CM | POA: Diagnosis not present

## 2014-11-04 DIAGNOSIS — J441 Chronic obstructive pulmonary disease with (acute) exacerbation: Secondary | ICD-10-CM | POA: Diagnosis not present

## 2014-11-04 DIAGNOSIS — I2510 Atherosclerotic heart disease of native coronary artery without angina pectoris: Secondary | ICD-10-CM | POA: Diagnosis not present

## 2014-11-04 DIAGNOSIS — F419 Anxiety disorder, unspecified: Secondary | ICD-10-CM | POA: Diagnosis not present

## 2014-11-07 DIAGNOSIS — M6281 Muscle weakness (generalized): Secondary | ICD-10-CM | POA: Diagnosis not present

## 2014-11-07 DIAGNOSIS — R627 Adult failure to thrive: Secondary | ICD-10-CM | POA: Diagnosis not present

## 2014-11-07 DIAGNOSIS — Z87891 Personal history of nicotine dependence: Secondary | ICD-10-CM | POA: Diagnosis not present

## 2014-11-07 DIAGNOSIS — F419 Anxiety disorder, unspecified: Secondary | ICD-10-CM | POA: Diagnosis not present

## 2014-11-07 DIAGNOSIS — J441 Chronic obstructive pulmonary disease with (acute) exacerbation: Secondary | ICD-10-CM | POA: Diagnosis not present

## 2014-11-07 DIAGNOSIS — I2510 Atherosclerotic heart disease of native coronary artery without angina pectoris: Secondary | ICD-10-CM | POA: Diagnosis not present

## 2014-11-07 DIAGNOSIS — Z9981 Dependence on supplemental oxygen: Secondary | ICD-10-CM | POA: Diagnosis not present

## 2014-11-07 DIAGNOSIS — R54 Age-related physical debility: Secondary | ICD-10-CM | POA: Diagnosis not present

## 2014-11-09 DIAGNOSIS — L720 Epidermal cyst: Secondary | ICD-10-CM | POA: Diagnosis not present

## 2014-11-11 DIAGNOSIS — F419 Anxiety disorder, unspecified: Secondary | ICD-10-CM | POA: Diagnosis not present

## 2014-11-11 DIAGNOSIS — J441 Chronic obstructive pulmonary disease with (acute) exacerbation: Secondary | ICD-10-CM | POA: Diagnosis not present

## 2014-11-11 DIAGNOSIS — Z9981 Dependence on supplemental oxygen: Secondary | ICD-10-CM | POA: Diagnosis not present

## 2014-11-11 DIAGNOSIS — R627 Adult failure to thrive: Secondary | ICD-10-CM | POA: Diagnosis not present

## 2014-11-11 DIAGNOSIS — M6281 Muscle weakness (generalized): Secondary | ICD-10-CM | POA: Diagnosis not present

## 2014-11-11 DIAGNOSIS — I2510 Atherosclerotic heart disease of native coronary artery without angina pectoris: Secondary | ICD-10-CM | POA: Diagnosis not present

## 2014-11-17 DIAGNOSIS — M6281 Muscle weakness (generalized): Secondary | ICD-10-CM | POA: Diagnosis not present

## 2014-11-17 DIAGNOSIS — F419 Anxiety disorder, unspecified: Secondary | ICD-10-CM | POA: Diagnosis not present

## 2014-11-17 DIAGNOSIS — R05 Cough: Secondary | ICD-10-CM | POA: Diagnosis not present

## 2014-11-17 DIAGNOSIS — R627 Adult failure to thrive: Secondary | ICD-10-CM | POA: Diagnosis not present

## 2014-11-17 DIAGNOSIS — J449 Chronic obstructive pulmonary disease, unspecified: Secondary | ICD-10-CM | POA: Diagnosis not present

## 2014-11-17 DIAGNOSIS — J441 Chronic obstructive pulmonary disease with (acute) exacerbation: Secondary | ICD-10-CM | POA: Diagnosis not present

## 2014-11-17 DIAGNOSIS — Z9981 Dependence on supplemental oxygen: Secondary | ICD-10-CM | POA: Diagnosis not present

## 2014-11-17 DIAGNOSIS — I2510 Atherosclerotic heart disease of native coronary artery without angina pectoris: Secondary | ICD-10-CM | POA: Diagnosis not present

## 2014-11-18 DIAGNOSIS — M6281 Muscle weakness (generalized): Secondary | ICD-10-CM | POA: Diagnosis not present

## 2014-11-18 DIAGNOSIS — F419 Anxiety disorder, unspecified: Secondary | ICD-10-CM | POA: Diagnosis not present

## 2014-11-18 DIAGNOSIS — Z9981 Dependence on supplemental oxygen: Secondary | ICD-10-CM | POA: Diagnosis not present

## 2014-11-18 DIAGNOSIS — R627 Adult failure to thrive: Secondary | ICD-10-CM | POA: Diagnosis not present

## 2014-11-18 DIAGNOSIS — J441 Chronic obstructive pulmonary disease with (acute) exacerbation: Secondary | ICD-10-CM | POA: Diagnosis not present

## 2014-11-18 DIAGNOSIS — I2510 Atherosclerotic heart disease of native coronary artery without angina pectoris: Secondary | ICD-10-CM | POA: Diagnosis not present

## 2014-11-22 DIAGNOSIS — J441 Chronic obstructive pulmonary disease with (acute) exacerbation: Secondary | ICD-10-CM | POA: Diagnosis not present

## 2014-11-22 DIAGNOSIS — I2510 Atherosclerotic heart disease of native coronary artery without angina pectoris: Secondary | ICD-10-CM | POA: Diagnosis not present

## 2014-11-22 DIAGNOSIS — M6281 Muscle weakness (generalized): Secondary | ICD-10-CM | POA: Diagnosis not present

## 2014-11-22 DIAGNOSIS — F419 Anxiety disorder, unspecified: Secondary | ICD-10-CM | POA: Diagnosis not present

## 2014-11-22 DIAGNOSIS — Z9981 Dependence on supplemental oxygen: Secondary | ICD-10-CM | POA: Diagnosis not present

## 2014-11-22 DIAGNOSIS — R627 Adult failure to thrive: Secondary | ICD-10-CM | POA: Diagnosis not present

## 2014-11-23 DIAGNOSIS — Z9981 Dependence on supplemental oxygen: Secondary | ICD-10-CM | POA: Diagnosis not present

## 2014-11-23 DIAGNOSIS — M6281 Muscle weakness (generalized): Secondary | ICD-10-CM | POA: Diagnosis not present

## 2014-11-23 DIAGNOSIS — F419 Anxiety disorder, unspecified: Secondary | ICD-10-CM | POA: Diagnosis not present

## 2014-11-23 DIAGNOSIS — I2510 Atherosclerotic heart disease of native coronary artery without angina pectoris: Secondary | ICD-10-CM | POA: Diagnosis not present

## 2014-11-23 DIAGNOSIS — R627 Adult failure to thrive: Secondary | ICD-10-CM | POA: Diagnosis not present

## 2014-11-23 DIAGNOSIS — J441 Chronic obstructive pulmonary disease with (acute) exacerbation: Secondary | ICD-10-CM | POA: Diagnosis not present

## 2014-11-24 DIAGNOSIS — M6281 Muscle weakness (generalized): Secondary | ICD-10-CM | POA: Diagnosis not present

## 2014-11-24 DIAGNOSIS — R627 Adult failure to thrive: Secondary | ICD-10-CM | POA: Diagnosis not present

## 2014-11-24 DIAGNOSIS — Z9981 Dependence on supplemental oxygen: Secondary | ICD-10-CM | POA: Diagnosis not present

## 2014-11-24 DIAGNOSIS — F419 Anxiety disorder, unspecified: Secondary | ICD-10-CM | POA: Diagnosis not present

## 2014-11-24 DIAGNOSIS — I2510 Atherosclerotic heart disease of native coronary artery without angina pectoris: Secondary | ICD-10-CM | POA: Diagnosis not present

## 2014-11-24 DIAGNOSIS — J441 Chronic obstructive pulmonary disease with (acute) exacerbation: Secondary | ICD-10-CM | POA: Diagnosis not present

## 2014-11-29 DIAGNOSIS — R627 Adult failure to thrive: Secondary | ICD-10-CM | POA: Diagnosis not present

## 2014-11-29 DIAGNOSIS — J441 Chronic obstructive pulmonary disease with (acute) exacerbation: Secondary | ICD-10-CM | POA: Diagnosis not present

## 2014-11-29 DIAGNOSIS — I2510 Atherosclerotic heart disease of native coronary artery without angina pectoris: Secondary | ICD-10-CM | POA: Diagnosis not present

## 2014-11-29 DIAGNOSIS — F419 Anxiety disorder, unspecified: Secondary | ICD-10-CM | POA: Diagnosis not present

## 2014-11-29 DIAGNOSIS — Z9981 Dependence on supplemental oxygen: Secondary | ICD-10-CM | POA: Diagnosis not present

## 2014-11-29 DIAGNOSIS — M6281 Muscle weakness (generalized): Secondary | ICD-10-CM | POA: Diagnosis not present

## 2014-12-01 DIAGNOSIS — F419 Anxiety disorder, unspecified: Secondary | ICD-10-CM | POA: Diagnosis not present

## 2014-12-01 DIAGNOSIS — Z9981 Dependence on supplemental oxygen: Secondary | ICD-10-CM | POA: Diagnosis not present

## 2014-12-01 DIAGNOSIS — M6281 Muscle weakness (generalized): Secondary | ICD-10-CM | POA: Diagnosis not present

## 2014-12-01 DIAGNOSIS — R627 Adult failure to thrive: Secondary | ICD-10-CM | POA: Diagnosis not present

## 2014-12-01 DIAGNOSIS — J441 Chronic obstructive pulmonary disease with (acute) exacerbation: Secondary | ICD-10-CM | POA: Diagnosis not present

## 2014-12-01 DIAGNOSIS — I2510 Atherosclerotic heart disease of native coronary artery without angina pectoris: Secondary | ICD-10-CM | POA: Diagnosis not present

## 2014-12-07 DIAGNOSIS — J441 Chronic obstructive pulmonary disease with (acute) exacerbation: Secondary | ICD-10-CM | POA: Diagnosis not present

## 2014-12-07 DIAGNOSIS — R627 Adult failure to thrive: Secondary | ICD-10-CM | POA: Diagnosis not present

## 2014-12-07 DIAGNOSIS — I2510 Atherosclerotic heart disease of native coronary artery without angina pectoris: Secondary | ICD-10-CM | POA: Diagnosis not present

## 2014-12-07 DIAGNOSIS — F419 Anxiety disorder, unspecified: Secondary | ICD-10-CM | POA: Diagnosis not present

## 2014-12-07 DIAGNOSIS — M6281 Muscle weakness (generalized): Secondary | ICD-10-CM | POA: Diagnosis not present

## 2014-12-07 DIAGNOSIS — Z9981 Dependence on supplemental oxygen: Secondary | ICD-10-CM | POA: Diagnosis not present

## 2014-12-08 DIAGNOSIS — F419 Anxiety disorder, unspecified: Secondary | ICD-10-CM | POA: Diagnosis not present

## 2014-12-08 DIAGNOSIS — M6281 Muscle weakness (generalized): Secondary | ICD-10-CM | POA: Diagnosis not present

## 2014-12-08 DIAGNOSIS — R627 Adult failure to thrive: Secondary | ICD-10-CM | POA: Diagnosis not present

## 2014-12-08 DIAGNOSIS — Z9981 Dependence on supplemental oxygen: Secondary | ICD-10-CM | POA: Diagnosis not present

## 2014-12-08 DIAGNOSIS — I2510 Atherosclerotic heart disease of native coronary artery without angina pectoris: Secondary | ICD-10-CM | POA: Diagnosis not present

## 2014-12-08 DIAGNOSIS — J441 Chronic obstructive pulmonary disease with (acute) exacerbation: Secondary | ICD-10-CM | POA: Diagnosis not present

## 2015-01-07 DIAGNOSIS — R1084 Generalized abdominal pain: Secondary | ICD-10-CM | POA: Diagnosis not present

## 2015-01-07 DIAGNOSIS — K5900 Constipation, unspecified: Secondary | ICD-10-CM | POA: Diagnosis not present

## 2015-01-07 DIAGNOSIS — R140 Abdominal distension (gaseous): Secondary | ICD-10-CM | POA: Diagnosis not present

## 2015-01-07 DIAGNOSIS — R194 Change in bowel habit: Secondary | ICD-10-CM | POA: Diagnosis not present

## 2015-01-10 DIAGNOSIS — M6281 Muscle weakness (generalized): Secondary | ICD-10-CM | POA: Diagnosis not present

## 2015-01-10 DIAGNOSIS — B351 Tinea unguium: Secondary | ICD-10-CM | POA: Diagnosis not present

## 2015-01-10 DIAGNOSIS — R278 Other lack of coordination: Secondary | ICD-10-CM | POA: Diagnosis not present

## 2015-01-10 DIAGNOSIS — M79671 Pain in right foot: Secondary | ICD-10-CM | POA: Diagnosis not present

## 2015-01-10 DIAGNOSIS — M79672 Pain in left foot: Secondary | ICD-10-CM | POA: Diagnosis not present

## 2015-01-10 DIAGNOSIS — R2689 Other abnormalities of gait and mobility: Secondary | ICD-10-CM | POA: Diagnosis not present

## 2015-01-10 DIAGNOSIS — Z736 Limitation of activities due to disability: Secondary | ICD-10-CM | POA: Diagnosis not present

## 2015-01-11 DIAGNOSIS — M6281 Muscle weakness (generalized): Secondary | ICD-10-CM | POA: Diagnosis not present

## 2015-01-11 DIAGNOSIS — Z736 Limitation of activities due to disability: Secondary | ICD-10-CM | POA: Diagnosis not present

## 2015-01-11 DIAGNOSIS — R2689 Other abnormalities of gait and mobility: Secondary | ICD-10-CM | POA: Diagnosis not present

## 2015-01-11 DIAGNOSIS — R278 Other lack of coordination: Secondary | ICD-10-CM | POA: Diagnosis not present

## 2015-01-12 DIAGNOSIS — R1084 Generalized abdominal pain: Secondary | ICD-10-CM | POA: Diagnosis not present

## 2015-01-12 DIAGNOSIS — R140 Abdominal distension (gaseous): Secondary | ICD-10-CM | POA: Diagnosis not present

## 2015-01-12 DIAGNOSIS — R278 Other lack of coordination: Secondary | ICD-10-CM | POA: Diagnosis not present

## 2015-01-12 DIAGNOSIS — R39198 Other difficulties with micturition: Secondary | ICD-10-CM | POA: Diagnosis not present

## 2015-01-12 DIAGNOSIS — Z736 Limitation of activities due to disability: Secondary | ICD-10-CM | POA: Diagnosis not present

## 2015-01-12 DIAGNOSIS — R338 Other retention of urine: Secondary | ICD-10-CM | POA: Diagnosis not present

## 2015-01-12 DIAGNOSIS — K5900 Constipation, unspecified: Secondary | ICD-10-CM | POA: Diagnosis not present

## 2015-01-12 DIAGNOSIS — M6281 Muscle weakness (generalized): Secondary | ICD-10-CM | POA: Diagnosis not present

## 2015-01-12 DIAGNOSIS — R2689 Other abnormalities of gait and mobility: Secondary | ICD-10-CM | POA: Diagnosis not present

## 2015-01-13 DIAGNOSIS — R2689 Other abnormalities of gait and mobility: Secondary | ICD-10-CM | POA: Diagnosis not present

## 2015-01-13 DIAGNOSIS — J449 Chronic obstructive pulmonary disease, unspecified: Secondary | ICD-10-CM | POA: Diagnosis not present

## 2015-01-13 DIAGNOSIS — Z87891 Personal history of nicotine dependence: Secondary | ICD-10-CM | POA: Diagnosis not present

## 2015-01-13 DIAGNOSIS — R278 Other lack of coordination: Secondary | ICD-10-CM | POA: Diagnosis not present

## 2015-01-13 DIAGNOSIS — Z736 Limitation of activities due to disability: Secondary | ICD-10-CM | POA: Diagnosis not present

## 2015-01-13 DIAGNOSIS — M6281 Muscle weakness (generalized): Secondary | ICD-10-CM | POA: Diagnosis not present

## 2015-01-13 DIAGNOSIS — R109 Unspecified abdominal pain: Secondary | ICD-10-CM | POA: Diagnosis not present

## 2015-01-13 DIAGNOSIS — I10 Essential (primary) hypertension: Secondary | ICD-10-CM | POA: Diagnosis not present

## 2015-01-14 DIAGNOSIS — R278 Other lack of coordination: Secondary | ICD-10-CM | POA: Diagnosis not present

## 2015-01-14 DIAGNOSIS — R339 Retention of urine, unspecified: Secondary | ICD-10-CM | POA: Diagnosis not present

## 2015-01-14 DIAGNOSIS — M6281 Muscle weakness (generalized): Secondary | ICD-10-CM | POA: Diagnosis not present

## 2015-01-14 DIAGNOSIS — I10 Essential (primary) hypertension: Secondary | ICD-10-CM | POA: Diagnosis not present

## 2015-01-14 DIAGNOSIS — F410 Panic disorder [episodic paroxysmal anxiety] without agoraphobia: Secondary | ICD-10-CM | POA: Diagnosis not present

## 2015-01-14 DIAGNOSIS — R2689 Other abnormalities of gait and mobility: Secondary | ICD-10-CM | POA: Diagnosis not present

## 2015-01-14 DIAGNOSIS — J449 Chronic obstructive pulmonary disease, unspecified: Secondary | ICD-10-CM | POA: Diagnosis not present

## 2015-01-14 DIAGNOSIS — F419 Anxiety disorder, unspecified: Secondary | ICD-10-CM | POA: Diagnosis not present

## 2015-01-14 DIAGNOSIS — Z736 Limitation of activities due to disability: Secondary | ICD-10-CM | POA: Diagnosis not present

## 2015-01-14 DIAGNOSIS — Z9981 Dependence on supplemental oxygen: Secondary | ICD-10-CM | POA: Diagnosis not present

## 2015-01-14 DIAGNOSIS — N281 Cyst of kidney, acquired: Secondary | ICD-10-CM | POA: Diagnosis not present

## 2015-01-17 DIAGNOSIS — R2689 Other abnormalities of gait and mobility: Secondary | ICD-10-CM | POA: Diagnosis not present

## 2015-01-17 DIAGNOSIS — R278 Other lack of coordination: Secondary | ICD-10-CM | POA: Diagnosis not present

## 2015-01-17 DIAGNOSIS — Z736 Limitation of activities due to disability: Secondary | ICD-10-CM | POA: Diagnosis not present

## 2015-01-17 DIAGNOSIS — M6281 Muscle weakness (generalized): Secondary | ICD-10-CM | POA: Diagnosis not present

## 2015-01-18 DIAGNOSIS — Z736 Limitation of activities due to disability: Secondary | ICD-10-CM | POA: Diagnosis not present

## 2015-01-18 DIAGNOSIS — M6281 Muscle weakness (generalized): Secondary | ICD-10-CM | POA: Diagnosis not present

## 2015-01-18 DIAGNOSIS — R278 Other lack of coordination: Secondary | ICD-10-CM | POA: Diagnosis not present

## 2015-01-18 DIAGNOSIS — R2689 Other abnormalities of gait and mobility: Secondary | ICD-10-CM | POA: Diagnosis not present

## 2015-01-19 DIAGNOSIS — I10 Essential (primary) hypertension: Secondary | ICD-10-CM | POA: Diagnosis not present

## 2015-01-19 DIAGNOSIS — R109 Unspecified abdominal pain: Secondary | ICD-10-CM | POA: Diagnosis not present

## 2015-01-19 DIAGNOSIS — M6281 Muscle weakness (generalized): Secondary | ICD-10-CM | POA: Diagnosis not present

## 2015-01-19 DIAGNOSIS — Z87891 Personal history of nicotine dependence: Secondary | ICD-10-CM | POA: Diagnosis not present

## 2015-01-19 DIAGNOSIS — J449 Chronic obstructive pulmonary disease, unspecified: Secondary | ICD-10-CM | POA: Diagnosis not present

## 2015-01-19 DIAGNOSIS — R278 Other lack of coordination: Secondary | ICD-10-CM | POA: Diagnosis not present

## 2015-01-19 DIAGNOSIS — R2689 Other abnormalities of gait and mobility: Secondary | ICD-10-CM | POA: Diagnosis not present

## 2015-01-19 DIAGNOSIS — Z736 Limitation of activities due to disability: Secondary | ICD-10-CM | POA: Diagnosis not present

## 2015-01-20 DIAGNOSIS — Z736 Limitation of activities due to disability: Secondary | ICD-10-CM | POA: Diagnosis not present

## 2015-01-20 DIAGNOSIS — R2689 Other abnormalities of gait and mobility: Secondary | ICD-10-CM | POA: Diagnosis not present

## 2015-01-20 DIAGNOSIS — R278 Other lack of coordination: Secondary | ICD-10-CM | POA: Diagnosis not present

## 2015-01-20 DIAGNOSIS — M6281 Muscle weakness (generalized): Secondary | ICD-10-CM | POA: Diagnosis not present

## 2015-01-21 DIAGNOSIS — M6281 Muscle weakness (generalized): Secondary | ICD-10-CM | POA: Diagnosis not present

## 2015-01-21 DIAGNOSIS — R2689 Other abnormalities of gait and mobility: Secondary | ICD-10-CM | POA: Diagnosis not present

## 2015-01-21 DIAGNOSIS — R278 Other lack of coordination: Secondary | ICD-10-CM | POA: Diagnosis not present

## 2015-01-21 DIAGNOSIS — Z23 Encounter for immunization: Secondary | ICD-10-CM | POA: Diagnosis not present

## 2015-01-21 DIAGNOSIS — Z736 Limitation of activities due to disability: Secondary | ICD-10-CM | POA: Diagnosis not present

## 2015-01-24 DIAGNOSIS — R278 Other lack of coordination: Secondary | ICD-10-CM | POA: Diagnosis not present

## 2015-01-24 DIAGNOSIS — M6281 Muscle weakness (generalized): Secondary | ICD-10-CM | POA: Diagnosis not present

## 2015-01-24 DIAGNOSIS — R2689 Other abnormalities of gait and mobility: Secondary | ICD-10-CM | POA: Diagnosis not present

## 2015-01-24 DIAGNOSIS — Z736 Limitation of activities due to disability: Secondary | ICD-10-CM | POA: Diagnosis not present

## 2015-01-25 DIAGNOSIS — K760 Fatty (change of) liver, not elsewhere classified: Secondary | ICD-10-CM | POA: Diagnosis not present

## 2015-01-25 DIAGNOSIS — Z736 Limitation of activities due to disability: Secondary | ICD-10-CM | POA: Diagnosis not present

## 2015-01-25 DIAGNOSIS — R109 Unspecified abdominal pain: Secondary | ICD-10-CM | POA: Diagnosis not present

## 2015-01-25 DIAGNOSIS — R278 Other lack of coordination: Secondary | ICD-10-CM | POA: Diagnosis not present

## 2015-01-25 DIAGNOSIS — R2689 Other abnormalities of gait and mobility: Secondary | ICD-10-CM | POA: Diagnosis not present

## 2015-01-25 DIAGNOSIS — N281 Cyst of kidney, acquired: Secondary | ICD-10-CM | POA: Diagnosis not present

## 2015-01-25 DIAGNOSIS — R140 Abdominal distension (gaseous): Secondary | ICD-10-CM | POA: Diagnosis not present

## 2015-01-25 DIAGNOSIS — M6281 Muscle weakness (generalized): Secondary | ICD-10-CM | POA: Diagnosis not present

## 2015-01-25 DIAGNOSIS — K5900 Constipation, unspecified: Secondary | ICD-10-CM | POA: Diagnosis not present

## 2015-01-26 DIAGNOSIS — R2689 Other abnormalities of gait and mobility: Secondary | ICD-10-CM | POA: Diagnosis not present

## 2015-01-26 DIAGNOSIS — R278 Other lack of coordination: Secondary | ICD-10-CM | POA: Diagnosis not present

## 2015-01-26 DIAGNOSIS — M6281 Muscle weakness (generalized): Secondary | ICD-10-CM | POA: Diagnosis not present

## 2015-01-26 DIAGNOSIS — Z736 Limitation of activities due to disability: Secondary | ICD-10-CM | POA: Diagnosis not present

## 2015-01-27 DIAGNOSIS — M6281 Muscle weakness (generalized): Secondary | ICD-10-CM | POA: Diagnosis not present

## 2015-01-27 DIAGNOSIS — Z736 Limitation of activities due to disability: Secondary | ICD-10-CM | POA: Diagnosis not present

## 2015-01-27 DIAGNOSIS — R278 Other lack of coordination: Secondary | ICD-10-CM | POA: Diagnosis not present

## 2015-01-27 DIAGNOSIS — R2689 Other abnormalities of gait and mobility: Secondary | ICD-10-CM | POA: Diagnosis not present

## 2015-01-28 DIAGNOSIS — M79672 Pain in left foot: Secondary | ICD-10-CM | POA: Diagnosis not present

## 2015-01-28 DIAGNOSIS — K5900 Constipation, unspecified: Secondary | ICD-10-CM | POA: Diagnosis not present

## 2015-01-28 DIAGNOSIS — M6281 Muscle weakness (generalized): Secondary | ICD-10-CM | POA: Diagnosis not present

## 2015-01-28 DIAGNOSIS — M25572 Pain in left ankle and joints of left foot: Secondary | ICD-10-CM | POA: Diagnosis not present

## 2015-01-28 DIAGNOSIS — Z736 Limitation of activities due to disability: Secondary | ICD-10-CM | POA: Diagnosis not present

## 2015-01-28 DIAGNOSIS — R278 Other lack of coordination: Secondary | ICD-10-CM | POA: Diagnosis not present

## 2015-01-28 DIAGNOSIS — R2689 Other abnormalities of gait and mobility: Secondary | ICD-10-CM | POA: Diagnosis not present

## 2015-01-28 DIAGNOSIS — M79671 Pain in right foot: Secondary | ICD-10-CM | POA: Diagnosis not present

## 2015-01-28 DIAGNOSIS — R140 Abdominal distension (gaseous): Secondary | ICD-10-CM | POA: Diagnosis not present

## 2015-01-28 DIAGNOSIS — M25571 Pain in right ankle and joints of right foot: Secondary | ICD-10-CM | POA: Diagnosis not present

## 2015-01-31 DIAGNOSIS — Z736 Limitation of activities due to disability: Secondary | ICD-10-CM | POA: Diagnosis not present

## 2015-01-31 DIAGNOSIS — M6281 Muscle weakness (generalized): Secondary | ICD-10-CM | POA: Diagnosis not present

## 2015-01-31 DIAGNOSIS — R278 Other lack of coordination: Secondary | ICD-10-CM | POA: Diagnosis not present

## 2015-01-31 DIAGNOSIS — R2689 Other abnormalities of gait and mobility: Secondary | ICD-10-CM | POA: Diagnosis not present

## 2015-02-01 DIAGNOSIS — R2689 Other abnormalities of gait and mobility: Secondary | ICD-10-CM | POA: Diagnosis not present

## 2015-02-01 DIAGNOSIS — M6281 Muscle weakness (generalized): Secondary | ICD-10-CM | POA: Diagnosis not present

## 2015-02-01 DIAGNOSIS — Z736 Limitation of activities due to disability: Secondary | ICD-10-CM | POA: Diagnosis not present

## 2015-02-01 DIAGNOSIS — R278 Other lack of coordination: Secondary | ICD-10-CM | POA: Diagnosis not present

## 2015-02-02 DIAGNOSIS — R278 Other lack of coordination: Secondary | ICD-10-CM | POA: Diagnosis not present

## 2015-02-02 DIAGNOSIS — M6281 Muscle weakness (generalized): Secondary | ICD-10-CM | POA: Diagnosis not present

## 2015-02-02 DIAGNOSIS — Z736 Limitation of activities due to disability: Secondary | ICD-10-CM | POA: Diagnosis not present

## 2015-02-02 DIAGNOSIS — R2689 Other abnormalities of gait and mobility: Secondary | ICD-10-CM | POA: Diagnosis not present

## 2015-02-04 DIAGNOSIS — M6281 Muscle weakness (generalized): Secondary | ICD-10-CM | POA: Diagnosis not present

## 2015-02-04 DIAGNOSIS — R278 Other lack of coordination: Secondary | ICD-10-CM | POA: Diagnosis not present

## 2015-02-04 DIAGNOSIS — Z736 Limitation of activities due to disability: Secondary | ICD-10-CM | POA: Diagnosis not present

## 2015-02-04 DIAGNOSIS — R2689 Other abnormalities of gait and mobility: Secondary | ICD-10-CM | POA: Diagnosis not present

## 2015-02-08 DIAGNOSIS — R2689 Other abnormalities of gait and mobility: Secondary | ICD-10-CM | POA: Diagnosis not present

## 2015-02-08 DIAGNOSIS — M6281 Muscle weakness (generalized): Secondary | ICD-10-CM | POA: Diagnosis not present

## 2015-02-09 DIAGNOSIS — R2689 Other abnormalities of gait and mobility: Secondary | ICD-10-CM | POA: Diagnosis not present

## 2015-02-09 DIAGNOSIS — M6281 Muscle weakness (generalized): Secondary | ICD-10-CM | POA: Diagnosis not present

## 2015-02-10 DIAGNOSIS — R2689 Other abnormalities of gait and mobility: Secondary | ICD-10-CM | POA: Diagnosis not present

## 2015-02-10 DIAGNOSIS — M6281 Muscle weakness (generalized): Secondary | ICD-10-CM | POA: Diagnosis not present

## 2015-02-14 DIAGNOSIS — M6281 Muscle weakness (generalized): Secondary | ICD-10-CM | POA: Diagnosis not present

## 2015-02-14 DIAGNOSIS — R2689 Other abnormalities of gait and mobility: Secondary | ICD-10-CM | POA: Diagnosis not present

## 2015-02-16 DIAGNOSIS — R2689 Other abnormalities of gait and mobility: Secondary | ICD-10-CM | POA: Diagnosis not present

## 2015-02-16 DIAGNOSIS — M6281 Muscle weakness (generalized): Secondary | ICD-10-CM | POA: Diagnosis not present

## 2015-02-18 DIAGNOSIS — M6281 Muscle weakness (generalized): Secondary | ICD-10-CM | POA: Diagnosis not present

## 2015-02-18 DIAGNOSIS — R2689 Other abnormalities of gait and mobility: Secondary | ICD-10-CM | POA: Diagnosis not present

## 2015-02-21 DIAGNOSIS — M6281 Muscle weakness (generalized): Secondary | ICD-10-CM | POA: Diagnosis not present

## 2015-02-21 DIAGNOSIS — R2689 Other abnormalities of gait and mobility: Secondary | ICD-10-CM | POA: Diagnosis not present

## 2015-02-21 DIAGNOSIS — K5900 Constipation, unspecified: Secondary | ICD-10-CM | POA: Diagnosis not present

## 2015-02-21 DIAGNOSIS — S01302S Unspecified open wound of left ear, sequela: Secondary | ICD-10-CM | POA: Diagnosis not present

## 2015-02-24 DIAGNOSIS — R2689 Other abnormalities of gait and mobility: Secondary | ICD-10-CM | POA: Diagnosis not present

## 2015-02-24 DIAGNOSIS — M6281 Muscle weakness (generalized): Secondary | ICD-10-CM | POA: Diagnosis not present

## 2015-02-25 DIAGNOSIS — R2689 Other abnormalities of gait and mobility: Secondary | ICD-10-CM | POA: Diagnosis not present

## 2015-02-25 DIAGNOSIS — M6281 Muscle weakness (generalized): Secondary | ICD-10-CM | POA: Diagnosis not present

## 2015-02-27 DIAGNOSIS — M6281 Muscle weakness (generalized): Secondary | ICD-10-CM | POA: Diagnosis not present

## 2015-02-27 DIAGNOSIS — R2689 Other abnormalities of gait and mobility: Secondary | ICD-10-CM | POA: Diagnosis not present

## 2015-02-28 DIAGNOSIS — M6281 Muscle weakness (generalized): Secondary | ICD-10-CM | POA: Diagnosis not present

## 2015-02-28 DIAGNOSIS — R2689 Other abnormalities of gait and mobility: Secondary | ICD-10-CM | POA: Diagnosis not present

## 2015-03-01 DIAGNOSIS — M6281 Muscle weakness (generalized): Secondary | ICD-10-CM | POA: Diagnosis not present

## 2015-03-01 DIAGNOSIS — R2689 Other abnormalities of gait and mobility: Secondary | ICD-10-CM | POA: Diagnosis not present

## 2015-03-07 DIAGNOSIS — M6281 Muscle weakness (generalized): Secondary | ICD-10-CM | POA: Diagnosis not present

## 2015-03-07 DIAGNOSIS — R2689 Other abnormalities of gait and mobility: Secondary | ICD-10-CM | POA: Diagnosis not present

## 2015-03-09 DIAGNOSIS — M6281 Muscle weakness (generalized): Secondary | ICD-10-CM | POA: Diagnosis not present

## 2015-03-09 DIAGNOSIS — R2689 Other abnormalities of gait and mobility: Secondary | ICD-10-CM | POA: Diagnosis not present

## 2015-03-11 DIAGNOSIS — R2689 Other abnormalities of gait and mobility: Secondary | ICD-10-CM | POA: Diagnosis not present

## 2015-03-11 DIAGNOSIS — M6281 Muscle weakness (generalized): Secondary | ICD-10-CM | POA: Diagnosis not present

## 2015-03-14 DIAGNOSIS — M6281 Muscle weakness (generalized): Secondary | ICD-10-CM | POA: Diagnosis not present

## 2015-03-14 DIAGNOSIS — R2689 Other abnormalities of gait and mobility: Secondary | ICD-10-CM | POA: Diagnosis not present

## 2015-03-16 DIAGNOSIS — R05 Cough: Secondary | ICD-10-CM | POA: Diagnosis not present

## 2015-03-16 DIAGNOSIS — J449 Chronic obstructive pulmonary disease, unspecified: Secondary | ICD-10-CM | POA: Diagnosis not present

## 2015-03-16 DIAGNOSIS — F419 Anxiety disorder, unspecified: Secondary | ICD-10-CM | POA: Diagnosis not present

## 2015-03-16 DIAGNOSIS — I10 Essential (primary) hypertension: Secondary | ICD-10-CM | POA: Diagnosis not present

## 2015-03-16 DIAGNOSIS — Z87891 Personal history of nicotine dependence: Secondary | ICD-10-CM | POA: Diagnosis not present

## 2015-03-16 DIAGNOSIS — J189 Pneumonia, unspecified organism: Secondary | ICD-10-CM | POA: Diagnosis not present

## 2015-03-16 DIAGNOSIS — R0789 Other chest pain: Secondary | ICD-10-CM | POA: Diagnosis not present

## 2015-03-16 DIAGNOSIS — M6281 Muscle weakness (generalized): Secondary | ICD-10-CM | POA: Diagnosis not present

## 2015-03-16 DIAGNOSIS — R0781 Pleurodynia: Secondary | ICD-10-CM | POA: Diagnosis not present

## 2015-03-16 DIAGNOSIS — H9209 Otalgia, unspecified ear: Secondary | ICD-10-CM | POA: Diagnosis not present

## 2015-03-16 DIAGNOSIS — R2689 Other abnormalities of gait and mobility: Secondary | ICD-10-CM | POA: Diagnosis not present

## 2015-03-21 DIAGNOSIS — J159 Unspecified bacterial pneumonia: Secondary | ICD-10-CM | POA: Diagnosis not present

## 2015-03-21 DIAGNOSIS — R05 Cough: Secondary | ICD-10-CM | POA: Diagnosis not present

## 2015-03-21 DIAGNOSIS — S01302D Unspecified open wound of left ear, subsequent encounter: Secondary | ICD-10-CM | POA: Diagnosis not present

## 2015-03-21 DIAGNOSIS — K5900 Constipation, unspecified: Secondary | ICD-10-CM | POA: Diagnosis not present

## 2015-03-22 DIAGNOSIS — M79672 Pain in left foot: Secondary | ICD-10-CM | POA: Diagnosis not present

## 2015-03-22 DIAGNOSIS — B351 Tinea unguium: Secondary | ICD-10-CM | POA: Diagnosis not present

## 2015-03-22 DIAGNOSIS — M79671 Pain in right foot: Secondary | ICD-10-CM | POA: Diagnosis not present

## 2015-03-23 DIAGNOSIS — M6281 Muscle weakness (generalized): Secondary | ICD-10-CM | POA: Diagnosis not present

## 2015-03-23 DIAGNOSIS — R2689 Other abnormalities of gait and mobility: Secondary | ICD-10-CM | POA: Diagnosis not present

## 2015-03-25 DIAGNOSIS — R918 Other nonspecific abnormal finding of lung field: Secondary | ICD-10-CM | POA: Diagnosis not present

## 2015-04-22 DIAGNOSIS — G894 Chronic pain syndrome: Secondary | ICD-10-CM | POA: Diagnosis not present

## 2015-04-22 DIAGNOSIS — Z1389 Encounter for screening for other disorder: Secondary | ICD-10-CM | POA: Diagnosis not present

## 2015-04-22 DIAGNOSIS — Z7409 Other reduced mobility: Secondary | ICD-10-CM | POA: Diagnosis not present

## 2015-04-22 DIAGNOSIS — J9612 Chronic respiratory failure with hypercapnia: Secondary | ICD-10-CM | POA: Diagnosis not present

## 2015-04-22 DIAGNOSIS — Z720 Tobacco use: Secondary | ICD-10-CM | POA: Diagnosis not present

## 2015-04-22 DIAGNOSIS — K5900 Constipation, unspecified: Secondary | ICD-10-CM | POA: Diagnosis not present

## 2015-04-22 DIAGNOSIS — F418 Other specified anxiety disorders: Secondary | ICD-10-CM | POA: Diagnosis not present

## 2015-05-11 DIAGNOSIS — I10 Essential (primary) hypertension: Secondary | ICD-10-CM | POA: Diagnosis not present

## 2015-05-11 DIAGNOSIS — Z87891 Personal history of nicotine dependence: Secondary | ICD-10-CM | POA: Diagnosis not present

## 2015-05-11 DIAGNOSIS — F419 Anxiety disorder, unspecified: Secondary | ICD-10-CM | POA: Diagnosis not present

## 2015-05-11 DIAGNOSIS — J449 Chronic obstructive pulmonary disease, unspecified: Secondary | ICD-10-CM | POA: Diagnosis not present

## 2015-05-11 DIAGNOSIS — Z9981 Dependence on supplemental oxygen: Secondary | ICD-10-CM | POA: Diagnosis not present

## 2015-05-11 DIAGNOSIS — K5900 Constipation, unspecified: Secondary | ICD-10-CM | POA: Diagnosis not present

## 2015-05-11 DIAGNOSIS — Z8701 Personal history of pneumonia (recurrent): Secondary | ICD-10-CM | POA: Diagnosis not present

## 2015-05-18 DIAGNOSIS — I10 Essential (primary) hypertension: Secondary | ICD-10-CM | POA: Diagnosis not present

## 2015-05-18 DIAGNOSIS — J449 Chronic obstructive pulmonary disease, unspecified: Secondary | ICD-10-CM | POA: Diagnosis not present

## 2015-05-18 DIAGNOSIS — G894 Chronic pain syndrome: Secondary | ICD-10-CM | POA: Diagnosis not present

## 2015-05-18 DIAGNOSIS — F418 Other specified anxiety disorders: Secondary | ICD-10-CM | POA: Diagnosis not present

## 2015-05-18 DIAGNOSIS — J9612 Chronic respiratory failure with hypercapnia: Secondary | ICD-10-CM | POA: Diagnosis not present

## 2015-05-18 DIAGNOSIS — Z125 Encounter for screening for malignant neoplasm of prostate: Secondary | ICD-10-CM | POA: Diagnosis not present

## 2015-05-18 DIAGNOSIS — M2550 Pain in unspecified joint: Secondary | ICD-10-CM | POA: Diagnosis not present

## 2015-05-18 DIAGNOSIS — D649 Anemia, unspecified: Secondary | ICD-10-CM | POA: Diagnosis not present

## 2015-05-19 DIAGNOSIS — I10 Essential (primary) hypertension: Secondary | ICD-10-CM | POA: Diagnosis not present

## 2015-05-19 DIAGNOSIS — F419 Anxiety disorder, unspecified: Secondary | ICD-10-CM | POA: Diagnosis not present

## 2015-05-19 DIAGNOSIS — Z8701 Personal history of pneumonia (recurrent): Secondary | ICD-10-CM | POA: Diagnosis not present

## 2015-05-19 DIAGNOSIS — K5900 Constipation, unspecified: Secondary | ICD-10-CM | POA: Diagnosis not present

## 2015-05-19 DIAGNOSIS — J449 Chronic obstructive pulmonary disease, unspecified: Secondary | ICD-10-CM | POA: Diagnosis not present

## 2015-05-19 DIAGNOSIS — Z87891 Personal history of nicotine dependence: Secondary | ICD-10-CM | POA: Diagnosis not present

## 2015-05-30 DIAGNOSIS — Z720 Tobacco use: Secondary | ICD-10-CM | POA: Diagnosis not present

## 2015-05-30 DIAGNOSIS — I4510 Unspecified right bundle-branch block: Secondary | ICD-10-CM | POA: Diagnosis not present

## 2015-05-30 DIAGNOSIS — I10 Essential (primary) hypertension: Secondary | ICD-10-CM | POA: Diagnosis not present

## 2015-05-30 DIAGNOSIS — F17210 Nicotine dependence, cigarettes, uncomplicated: Secondary | ICD-10-CM | POA: Diagnosis not present

## 2015-05-30 DIAGNOSIS — R0602 Shortness of breath: Secondary | ICD-10-CM | POA: Diagnosis not present

## 2015-05-30 DIAGNOSIS — F419 Anxiety disorder, unspecified: Secondary | ICD-10-CM | POA: Diagnosis not present

## 2015-05-30 DIAGNOSIS — J441 Chronic obstructive pulmonary disease with (acute) exacerbation: Secondary | ICD-10-CM | POA: Diagnosis not present

## 2015-05-30 DIAGNOSIS — Z8673 Personal history of transient ischemic attack (TIA), and cerebral infarction without residual deficits: Secondary | ICD-10-CM | POA: Diagnosis not present

## 2015-05-30 DIAGNOSIS — Z7951 Long term (current) use of inhaled steroids: Secondary | ICD-10-CM | POA: Diagnosis not present

## 2015-05-30 DIAGNOSIS — J439 Emphysema, unspecified: Secondary | ICD-10-CM | POA: Diagnosis not present

## 2015-06-03 DIAGNOSIS — I10 Essential (primary) hypertension: Secondary | ICD-10-CM | POA: Diagnosis not present

## 2015-06-03 DIAGNOSIS — K5900 Constipation, unspecified: Secondary | ICD-10-CM | POA: Diagnosis not present

## 2015-06-03 DIAGNOSIS — F419 Anxiety disorder, unspecified: Secondary | ICD-10-CM | POA: Diagnosis not present

## 2015-06-03 DIAGNOSIS — Z87891 Personal history of nicotine dependence: Secondary | ICD-10-CM | POA: Diagnosis not present

## 2015-06-03 DIAGNOSIS — Z8701 Personal history of pneumonia (recurrent): Secondary | ICD-10-CM | POA: Diagnosis not present

## 2015-06-03 DIAGNOSIS — J449 Chronic obstructive pulmonary disease, unspecified: Secondary | ICD-10-CM | POA: Diagnosis not present

## 2015-06-05 DIAGNOSIS — F419 Anxiety disorder, unspecified: Secondary | ICD-10-CM | POA: Diagnosis not present

## 2015-06-05 DIAGNOSIS — R0602 Shortness of breath: Secondary | ICD-10-CM | POA: Diagnosis not present

## 2015-06-05 DIAGNOSIS — Z9981 Dependence on supplemental oxygen: Secondary | ICD-10-CM | POA: Diagnosis not present

## 2015-06-05 DIAGNOSIS — Z7951 Long term (current) use of inhaled steroids: Secondary | ICD-10-CM | POA: Diagnosis not present

## 2015-06-05 DIAGNOSIS — Z87891 Personal history of nicotine dependence: Secondary | ICD-10-CM | POA: Diagnosis not present

## 2015-06-05 DIAGNOSIS — J449 Chronic obstructive pulmonary disease, unspecified: Secondary | ICD-10-CM | POA: Diagnosis not present

## 2015-06-05 DIAGNOSIS — I10 Essential (primary) hypertension: Secondary | ICD-10-CM | POA: Diagnosis not present

## 2015-06-05 DIAGNOSIS — Z8673 Personal history of transient ischemic attack (TIA), and cerebral infarction without residual deficits: Secondary | ICD-10-CM | POA: Diagnosis not present

## 2015-06-07 DIAGNOSIS — F17200 Nicotine dependence, unspecified, uncomplicated: Secondary | ICD-10-CM | POA: Diagnosis not present

## 2015-06-07 DIAGNOSIS — R000 Tachycardia, unspecified: Secondary | ICD-10-CM | POA: Diagnosis not present

## 2015-06-07 DIAGNOSIS — Z8673 Personal history of transient ischemic attack (TIA), and cerebral infarction without residual deficits: Secondary | ICD-10-CM | POA: Diagnosis not present

## 2015-06-07 DIAGNOSIS — R0600 Dyspnea, unspecified: Secondary | ICD-10-CM | POA: Diagnosis not present

## 2015-06-07 DIAGNOSIS — J449 Chronic obstructive pulmonary disease, unspecified: Secondary | ICD-10-CM | POA: Diagnosis not present

## 2015-06-07 DIAGNOSIS — F419 Anxiety disorder, unspecified: Secondary | ICD-10-CM | POA: Diagnosis not present

## 2015-06-07 DIAGNOSIS — I10 Essential (primary) hypertension: Secondary | ICD-10-CM | POA: Diagnosis not present

## 2015-06-07 DIAGNOSIS — Z720 Tobacco use: Secondary | ICD-10-CM | POA: Diagnosis not present

## 2015-06-07 DIAGNOSIS — I4510 Unspecified right bundle-branch block: Secondary | ICD-10-CM | POA: Diagnosis not present

## 2015-06-08 DIAGNOSIS — F419 Anxiety disorder, unspecified: Secondary | ICD-10-CM | POA: Diagnosis not present

## 2015-06-08 DIAGNOSIS — Z87891 Personal history of nicotine dependence: Secondary | ICD-10-CM | POA: Diagnosis not present

## 2015-06-08 DIAGNOSIS — K5900 Constipation, unspecified: Secondary | ICD-10-CM | POA: Diagnosis not present

## 2015-06-08 DIAGNOSIS — J449 Chronic obstructive pulmonary disease, unspecified: Secondary | ICD-10-CM | POA: Diagnosis not present

## 2015-06-08 DIAGNOSIS — I10 Essential (primary) hypertension: Secondary | ICD-10-CM | POA: Diagnosis not present

## 2015-06-08 DIAGNOSIS — Z8701 Personal history of pneumonia (recurrent): Secondary | ICD-10-CM | POA: Diagnosis not present

## 2015-06-10 DIAGNOSIS — Z8701 Personal history of pneumonia (recurrent): Secondary | ICD-10-CM | POA: Diagnosis not present

## 2015-06-10 DIAGNOSIS — J449 Chronic obstructive pulmonary disease, unspecified: Secondary | ICD-10-CM | POA: Diagnosis not present

## 2015-06-10 DIAGNOSIS — Z87891 Personal history of nicotine dependence: Secondary | ICD-10-CM | POA: Diagnosis not present

## 2015-06-10 DIAGNOSIS — F419 Anxiety disorder, unspecified: Secondary | ICD-10-CM | POA: Diagnosis not present

## 2015-06-10 DIAGNOSIS — I10 Essential (primary) hypertension: Secondary | ICD-10-CM | POA: Diagnosis not present

## 2015-06-10 DIAGNOSIS — K5900 Constipation, unspecified: Secondary | ICD-10-CM | POA: Diagnosis not present

## 2015-06-11 DIAGNOSIS — Z7951 Long term (current) use of inhaled steroids: Secondary | ICD-10-CM | POA: Diagnosis not present

## 2015-06-11 DIAGNOSIS — I10 Essential (primary) hypertension: Secondary | ICD-10-CM | POA: Diagnosis not present

## 2015-06-11 DIAGNOSIS — Z8673 Personal history of transient ischemic attack (TIA), and cerebral infarction without residual deficits: Secondary | ICD-10-CM | POA: Diagnosis not present

## 2015-06-11 DIAGNOSIS — F17210 Nicotine dependence, cigarettes, uncomplicated: Secondary | ICD-10-CM | POA: Diagnosis not present

## 2015-06-11 DIAGNOSIS — J449 Chronic obstructive pulmonary disease, unspecified: Secondary | ICD-10-CM | POA: Diagnosis not present

## 2015-06-11 DIAGNOSIS — R000 Tachycardia, unspecified: Secondary | ICD-10-CM | POA: Diagnosis not present

## 2015-06-11 DIAGNOSIS — J9611 Chronic respiratory failure with hypoxia: Secondary | ICD-10-CM | POA: Diagnosis not present

## 2015-06-11 DIAGNOSIS — Z79899 Other long term (current) drug therapy: Secondary | ICD-10-CM | POA: Diagnosis not present

## 2015-06-11 DIAGNOSIS — F419 Anxiety disorder, unspecified: Secondary | ICD-10-CM | POA: Diagnosis not present

## 2015-06-11 DIAGNOSIS — R0602 Shortness of breath: Secondary | ICD-10-CM | POA: Diagnosis not present

## 2015-06-11 DIAGNOSIS — Z720 Tobacco use: Secondary | ICD-10-CM | POA: Diagnosis not present

## 2015-06-11 DIAGNOSIS — I4510 Unspecified right bundle-branch block: Secondary | ICD-10-CM | POA: Diagnosis not present

## 2015-06-11 DIAGNOSIS — Z7952 Long term (current) use of systemic steroids: Secondary | ICD-10-CM | POA: Diagnosis not present

## 2015-06-19 DIAGNOSIS — Z87891 Personal history of nicotine dependence: Secondary | ICD-10-CM | POA: Diagnosis not present

## 2015-06-19 DIAGNOSIS — I10 Essential (primary) hypertension: Secondary | ICD-10-CM | POA: Diagnosis not present

## 2015-06-19 DIAGNOSIS — R069 Unspecified abnormalities of breathing: Secondary | ICD-10-CM | POA: Diagnosis not present

## 2015-06-19 DIAGNOSIS — R0602 Shortness of breath: Secondary | ICD-10-CM | POA: Diagnosis not present

## 2015-06-19 DIAGNOSIS — J449 Chronic obstructive pulmonary disease, unspecified: Secondary | ICD-10-CM | POA: Diagnosis not present

## 2015-06-19 DIAGNOSIS — Z8673 Personal history of transient ischemic attack (TIA), and cerebral infarction without residual deficits: Secondary | ICD-10-CM | POA: Diagnosis not present

## 2015-06-19 DIAGNOSIS — F419 Anxiety disorder, unspecified: Secondary | ICD-10-CM | POA: Diagnosis not present

## 2015-06-19 DIAGNOSIS — R51 Headache: Secondary | ICD-10-CM | POA: Diagnosis not present

## 2015-06-20 DIAGNOSIS — J449 Chronic obstructive pulmonary disease, unspecified: Secondary | ICD-10-CM | POA: Diagnosis not present

## 2015-06-24 DIAGNOSIS — K5900 Constipation, unspecified: Secondary | ICD-10-CM | POA: Diagnosis not present

## 2015-06-24 DIAGNOSIS — J449 Chronic obstructive pulmonary disease, unspecified: Secondary | ICD-10-CM | POA: Diagnosis not present

## 2015-06-24 DIAGNOSIS — Z87891 Personal history of nicotine dependence: Secondary | ICD-10-CM | POA: Diagnosis not present

## 2015-06-24 DIAGNOSIS — Z8701 Personal history of pneumonia (recurrent): Secondary | ICD-10-CM | POA: Diagnosis not present

## 2015-06-24 DIAGNOSIS — I10 Essential (primary) hypertension: Secondary | ICD-10-CM | POA: Diagnosis not present

## 2015-06-24 DIAGNOSIS — F419 Anxiety disorder, unspecified: Secondary | ICD-10-CM | POA: Diagnosis not present

## 2015-06-29 DIAGNOSIS — Z87891 Personal history of nicotine dependence: Secondary | ICD-10-CM | POA: Diagnosis not present

## 2015-06-29 DIAGNOSIS — I10 Essential (primary) hypertension: Secondary | ICD-10-CM | POA: Diagnosis not present

## 2015-06-29 DIAGNOSIS — Z8701 Personal history of pneumonia (recurrent): Secondary | ICD-10-CM | POA: Diagnosis not present

## 2015-06-29 DIAGNOSIS — J449 Chronic obstructive pulmonary disease, unspecified: Secondary | ICD-10-CM | POA: Diagnosis not present

## 2015-06-29 DIAGNOSIS — F419 Anxiety disorder, unspecified: Secondary | ICD-10-CM | POA: Diagnosis not present

## 2015-06-29 DIAGNOSIS — K5900 Constipation, unspecified: Secondary | ICD-10-CM | POA: Diagnosis not present

## 2015-06-30 DIAGNOSIS — F419 Anxiety disorder, unspecified: Secondary | ICD-10-CM | POA: Diagnosis not present

## 2015-06-30 DIAGNOSIS — Z8701 Personal history of pneumonia (recurrent): Secondary | ICD-10-CM | POA: Diagnosis not present

## 2015-06-30 DIAGNOSIS — J449 Chronic obstructive pulmonary disease, unspecified: Secondary | ICD-10-CM | POA: Diagnosis not present

## 2015-06-30 DIAGNOSIS — Z87891 Personal history of nicotine dependence: Secondary | ICD-10-CM | POA: Diagnosis not present

## 2015-06-30 DIAGNOSIS — K5900 Constipation, unspecified: Secondary | ICD-10-CM | POA: Diagnosis not present

## 2015-06-30 DIAGNOSIS — Z79899 Other long term (current) drug therapy: Secondary | ICD-10-CM | POA: Diagnosis not present

## 2015-06-30 DIAGNOSIS — R6883 Chills (without fever): Secondary | ICD-10-CM | POA: Diagnosis not present

## 2015-06-30 DIAGNOSIS — I10 Essential (primary) hypertension: Secondary | ICD-10-CM | POA: Diagnosis not present

## 2015-06-30 DIAGNOSIS — R0602 Shortness of breath: Secondary | ICD-10-CM | POA: Diagnosis not present

## 2015-06-30 DIAGNOSIS — F17210 Nicotine dependence, cigarettes, uncomplicated: Secondary | ICD-10-CM | POA: Diagnosis not present

## 2015-06-30 DIAGNOSIS — R51 Headache: Secondary | ICD-10-CM | POA: Diagnosis not present

## 2015-06-30 DIAGNOSIS — J441 Chronic obstructive pulmonary disease with (acute) exacerbation: Secondary | ICD-10-CM | POA: Diagnosis not present

## 2015-06-30 DIAGNOSIS — Z8673 Personal history of transient ischemic attack (TIA), and cerebral infarction without residual deficits: Secondary | ICD-10-CM | POA: Diagnosis not present

## 2015-07-01 DIAGNOSIS — J9612 Chronic respiratory failure with hypercapnia: Secondary | ICD-10-CM | POA: Diagnosis not present

## 2015-07-01 DIAGNOSIS — Z7951 Long term (current) use of inhaled steroids: Secondary | ICD-10-CM | POA: Diagnosis not present

## 2015-07-01 DIAGNOSIS — R0602 Shortness of breath: Secondary | ICD-10-CM | POA: Diagnosis not present

## 2015-07-01 DIAGNOSIS — R509 Fever, unspecified: Secondary | ICD-10-CM | POA: Diagnosis not present

## 2015-07-01 DIAGNOSIS — F418 Other specified anxiety disorders: Secondary | ICD-10-CM | POA: Diagnosis not present

## 2015-07-01 DIAGNOSIS — J441 Chronic obstructive pulmonary disease with (acute) exacerbation: Secondary | ICD-10-CM | POA: Diagnosis not present

## 2015-07-01 DIAGNOSIS — I10 Essential (primary) hypertension: Secondary | ICD-10-CM | POA: Diagnosis not present

## 2015-07-01 DIAGNOSIS — R05 Cough: Secondary | ICD-10-CM | POA: Diagnosis not present

## 2015-07-01 DIAGNOSIS — Z8673 Personal history of transient ischemic attack (TIA), and cerebral infarction without residual deficits: Secondary | ICD-10-CM | POA: Diagnosis not present

## 2015-07-01 DIAGNOSIS — Z720 Tobacco use: Secondary | ICD-10-CM | POA: Diagnosis not present

## 2015-07-01 DIAGNOSIS — Z79899 Other long term (current) drug therapy: Secondary | ICD-10-CM | POA: Diagnosis not present

## 2015-07-01 DIAGNOSIS — F17210 Nicotine dependence, cigarettes, uncomplicated: Secondary | ICD-10-CM | POA: Diagnosis not present

## 2015-07-01 DIAGNOSIS — J449 Chronic obstructive pulmonary disease, unspecified: Secondary | ICD-10-CM | POA: Diagnosis not present

## 2015-07-02 DIAGNOSIS — R0602 Shortness of breath: Secondary | ICD-10-CM | POA: Diagnosis not present

## 2015-07-02 DIAGNOSIS — Z8673 Personal history of transient ischemic attack (TIA), and cerebral infarction without residual deficits: Secondary | ICD-10-CM | POA: Diagnosis not present

## 2015-07-02 DIAGNOSIS — J449 Chronic obstructive pulmonary disease, unspecified: Secondary | ICD-10-CM | POA: Diagnosis not present

## 2015-07-02 DIAGNOSIS — I10 Essential (primary) hypertension: Secondary | ICD-10-CM | POA: Diagnosis not present

## 2015-07-02 DIAGNOSIS — F419 Anxiety disorder, unspecified: Secondary | ICD-10-CM | POA: Diagnosis not present

## 2015-07-02 DIAGNOSIS — Z7951 Long term (current) use of inhaled steroids: Secondary | ICD-10-CM | POA: Diagnosis not present

## 2015-07-02 DIAGNOSIS — F17210 Nicotine dependence, cigarettes, uncomplicated: Secondary | ICD-10-CM | POA: Diagnosis not present

## 2015-07-04 DIAGNOSIS — R079 Chest pain, unspecified: Secondary | ICD-10-CM | POA: Diagnosis not present

## 2015-07-04 DIAGNOSIS — R0602 Shortness of breath: Secondary | ICD-10-CM | POA: Diagnosis not present

## 2015-07-04 DIAGNOSIS — I10 Essential (primary) hypertension: Secondary | ICD-10-CM | POA: Diagnosis not present

## 2015-07-04 DIAGNOSIS — F419 Anxiety disorder, unspecified: Secondary | ICD-10-CM | POA: Diagnosis not present

## 2015-07-04 DIAGNOSIS — J441 Chronic obstructive pulmonary disease with (acute) exacerbation: Secondary | ICD-10-CM | POA: Diagnosis not present

## 2015-07-05 DIAGNOSIS — J441 Chronic obstructive pulmonary disease with (acute) exacerbation: Secondary | ICD-10-CM | POA: Diagnosis not present

## 2015-07-06 DIAGNOSIS — R05 Cough: Secondary | ICD-10-CM | POA: Diagnosis not present

## 2015-07-06 DIAGNOSIS — R0602 Shortness of breath: Secondary | ICD-10-CM | POA: Diagnosis not present

## 2015-07-06 DIAGNOSIS — J189 Pneumonia, unspecified organism: Secondary | ICD-10-CM | POA: Diagnosis not present

## 2015-07-07 DIAGNOSIS — Z79899 Other long term (current) drug therapy: Secondary | ICD-10-CM | POA: Diagnosis not present

## 2015-07-07 DIAGNOSIS — Z7951 Long term (current) use of inhaled steroids: Secondary | ICD-10-CM | POA: Diagnosis not present

## 2015-07-07 DIAGNOSIS — R0602 Shortness of breath: Secondary | ICD-10-CM | POA: Diagnosis not present

## 2015-07-07 DIAGNOSIS — R918 Other nonspecific abnormal finding of lung field: Secondary | ICD-10-CM | POA: Diagnosis not present

## 2015-07-07 DIAGNOSIS — J439 Emphysema, unspecified: Secondary | ICD-10-CM | POA: Diagnosis not present

## 2015-07-07 DIAGNOSIS — Z8673 Personal history of transient ischemic attack (TIA), and cerebral infarction without residual deficits: Secondary | ICD-10-CM | POA: Diagnosis not present

## 2015-07-07 DIAGNOSIS — I4510 Unspecified right bundle-branch block: Secondary | ICD-10-CM | POA: Diagnosis not present

## 2015-07-07 DIAGNOSIS — Z9981 Dependence on supplemental oxygen: Secondary | ICD-10-CM | POA: Diagnosis not present

## 2015-07-07 DIAGNOSIS — I10 Essential (primary) hypertension: Secondary | ICD-10-CM | POA: Diagnosis not present

## 2015-07-07 DIAGNOSIS — Z888 Allergy status to other drugs, medicaments and biological substances status: Secondary | ICD-10-CM | POA: Diagnosis not present

## 2015-07-07 DIAGNOSIS — R079 Chest pain, unspecified: Secondary | ICD-10-CM | POA: Diagnosis not present

## 2015-07-09 DIAGNOSIS — Z8673 Personal history of transient ischemic attack (TIA), and cerebral infarction without residual deficits: Secondary | ICD-10-CM | POA: Diagnosis not present

## 2015-07-09 DIAGNOSIS — J168 Pneumonia due to other specified infectious organisms: Secondary | ICD-10-CM | POA: Diagnosis not present

## 2015-07-09 DIAGNOSIS — J189 Pneumonia, unspecified organism: Secondary | ICD-10-CM | POA: Diagnosis not present

## 2015-07-09 DIAGNOSIS — Z87891 Personal history of nicotine dependence: Secondary | ICD-10-CM | POA: Diagnosis not present

## 2015-07-09 DIAGNOSIS — I10 Essential (primary) hypertension: Secondary | ICD-10-CM | POA: Diagnosis not present

## 2015-07-09 DIAGNOSIS — J449 Chronic obstructive pulmonary disease, unspecified: Secondary | ICD-10-CM | POA: Diagnosis not present

## 2015-07-09 DIAGNOSIS — R0602 Shortness of breath: Secondary | ICD-10-CM | POA: Diagnosis not present

## 2015-07-17 DIAGNOSIS — R0602 Shortness of breath: Secondary | ICD-10-CM | POA: Diagnosis not present

## 2015-07-17 DIAGNOSIS — R000 Tachycardia, unspecified: Secondary | ICD-10-CM | POA: Diagnosis not present

## 2015-07-17 DIAGNOSIS — R05 Cough: Secondary | ICD-10-CM | POA: Diagnosis not present

## 2015-07-19 DIAGNOSIS — R918 Other nonspecific abnormal finding of lung field: Secondary | ICD-10-CM | POA: Diagnosis not present

## 2015-07-19 DIAGNOSIS — J441 Chronic obstructive pulmonary disease with (acute) exacerbation: Secondary | ICD-10-CM | POA: Diagnosis not present

## 2015-07-19 DIAGNOSIS — Z87891 Personal history of nicotine dependence: Secondary | ICD-10-CM | POA: Diagnosis not present

## 2015-07-19 DIAGNOSIS — R079 Chest pain, unspecified: Secondary | ICD-10-CM | POA: Diagnosis not present

## 2015-07-19 DIAGNOSIS — R0602 Shortness of breath: Secondary | ICD-10-CM | POA: Diagnosis not present

## 2015-07-19 DIAGNOSIS — J90 Pleural effusion, not elsewhere classified: Secondary | ICD-10-CM | POA: Diagnosis not present

## 2015-07-19 DIAGNOSIS — R000 Tachycardia, unspecified: Secondary | ICD-10-CM | POA: Diagnosis not present

## 2015-07-19 DIAGNOSIS — I213 ST elevation (STEMI) myocardial infarction of unspecified site: Secondary | ICD-10-CM | POA: Diagnosis not present

## 2015-07-19 DIAGNOSIS — R51 Headache: Secondary | ICD-10-CM | POA: Diagnosis not present

## 2015-07-20 DIAGNOSIS — Z7951 Long term (current) use of inhaled steroids: Secondary | ICD-10-CM | POA: Diagnosis not present

## 2015-07-20 DIAGNOSIS — R0602 Shortness of breath: Secondary | ICD-10-CM | POA: Diagnosis not present

## 2015-07-20 DIAGNOSIS — R51 Headache: Secondary | ICD-10-CM | POA: Diagnosis not present

## 2015-07-20 DIAGNOSIS — R079 Chest pain, unspecified: Secondary | ICD-10-CM | POA: Diagnosis not present

## 2015-07-20 DIAGNOSIS — F419 Anxiety disorder, unspecified: Secondary | ICD-10-CM | POA: Diagnosis not present

## 2015-07-20 DIAGNOSIS — J441 Chronic obstructive pulmonary disease with (acute) exacerbation: Secondary | ICD-10-CM | POA: Diagnosis not present

## 2015-07-20 DIAGNOSIS — I4510 Unspecified right bundle-branch block: Secondary | ICD-10-CM | POA: Diagnosis not present

## 2015-07-20 DIAGNOSIS — J449 Chronic obstructive pulmonary disease, unspecified: Secondary | ICD-10-CM | POA: Diagnosis not present

## 2015-07-20 DIAGNOSIS — R9431 Abnormal electrocardiogram [ECG] [EKG]: Secondary | ICD-10-CM | POA: Diagnosis not present

## 2015-07-20 DIAGNOSIS — Z87891 Personal history of nicotine dependence: Secondary | ICD-10-CM | POA: Diagnosis not present

## 2015-07-20 DIAGNOSIS — Z8673 Personal history of transient ischemic attack (TIA), and cerebral infarction without residual deficits: Secondary | ICD-10-CM | POA: Diagnosis not present

## 2015-07-20 DIAGNOSIS — R0789 Other chest pain: Secondary | ICD-10-CM | POA: Diagnosis not present

## 2015-07-20 DIAGNOSIS — R000 Tachycardia, unspecified: Secondary | ICD-10-CM | POA: Diagnosis not present

## 2015-07-20 DIAGNOSIS — J90 Pleural effusion, not elsewhere classified: Secondary | ICD-10-CM | POA: Diagnosis not present

## 2015-07-20 DIAGNOSIS — R918 Other nonspecific abnormal finding of lung field: Secondary | ICD-10-CM | POA: Diagnosis not present

## 2015-07-20 DIAGNOSIS — R0902 Hypoxemia: Secondary | ICD-10-CM | POA: Diagnosis not present

## 2015-07-20 DIAGNOSIS — Z8249 Family history of ischemic heart disease and other diseases of the circulatory system: Secondary | ICD-10-CM | POA: Diagnosis not present

## 2015-07-20 DIAGNOSIS — I10 Essential (primary) hypertension: Secondary | ICD-10-CM | POA: Diagnosis not present

## 2015-07-21 ENCOUNTER — Encounter (HOSPITAL_COMMUNITY): Payer: Self-pay | Admitting: Emergency Medicine

## 2015-07-21 ENCOUNTER — Emergency Department (HOSPITAL_COMMUNITY): Payer: Medicare Other

## 2015-07-21 ENCOUNTER — Inpatient Hospital Stay (HOSPITAL_COMMUNITY)
Admission: EM | Admit: 2015-07-21 | Discharge: 2015-07-25 | DRG: 192 | Disposition: A | Discharge status: Home / Self Care | Payer: Medicare Other | Attending: Internal Medicine | Admitting: Internal Medicine

## 2015-07-21 DIAGNOSIS — R0902 Hypoxemia: Secondary | ICD-10-CM | POA: Diagnosis not present

## 2015-07-21 DIAGNOSIS — J449 Chronic obstructive pulmonary disease, unspecified: Secondary | ICD-10-CM

## 2015-07-21 DIAGNOSIS — Z8673 Personal history of transient ischemic attack (TIA), and cerebral infarction without residual deficits: Secondary | ICD-10-CM | POA: Diagnosis not present

## 2015-07-21 DIAGNOSIS — R569 Unspecified convulsions: Secondary | ICD-10-CM | POA: Diagnosis present

## 2015-07-21 DIAGNOSIS — R079 Chest pain, unspecified: Secondary | ICD-10-CM

## 2015-07-21 DIAGNOSIS — R0789 Other chest pain: Secondary | ICD-10-CM | POA: Diagnosis not present

## 2015-07-21 DIAGNOSIS — D649 Anemia, unspecified: Secondary | ICD-10-CM | POA: Diagnosis not present

## 2015-07-21 DIAGNOSIS — F17200 Nicotine dependence, unspecified, uncomplicated: Secondary | ICD-10-CM | POA: Diagnosis present

## 2015-07-21 DIAGNOSIS — J80 Acute respiratory distress syndrome: Secondary | ICD-10-CM | POA: Diagnosis not present

## 2015-07-21 DIAGNOSIS — R0781 Pleurodynia: Secondary | ICD-10-CM | POA: Diagnosis not present

## 2015-07-21 DIAGNOSIS — J40 Bronchitis, not specified as acute or chronic: Secondary | ICD-10-CM | POA: Diagnosis present

## 2015-07-21 DIAGNOSIS — I10 Essential (primary) hypertension: Secondary | ICD-10-CM | POA: Diagnosis present

## 2015-07-21 DIAGNOSIS — F419 Anxiety disorder, unspecified: Secondary | ICD-10-CM | POA: Diagnosis present

## 2015-07-21 DIAGNOSIS — I4510 Unspecified right bundle-branch block: Secondary | ICD-10-CM | POA: Diagnosis present

## 2015-07-21 DIAGNOSIS — R9431 Abnormal electrocardiogram [ECG] [EKG]: Secondary | ICD-10-CM | POA: Diagnosis not present

## 2015-07-21 DIAGNOSIS — J441 Chronic obstructive pulmonary disease with (acute) exacerbation: Principal | ICD-10-CM | POA: Diagnosis present

## 2015-07-21 DIAGNOSIS — I499 Cardiac arrhythmia, unspecified: Secondary | ICD-10-CM | POA: Diagnosis not present

## 2015-07-21 DIAGNOSIS — R0602 Shortness of breath: Secondary | ICD-10-CM | POA: Diagnosis not present

## 2015-07-21 DIAGNOSIS — J440 Chronic obstructive pulmonary disease with acute lower respiratory infection: Secondary | ICD-10-CM | POA: Diagnosis present

## 2015-07-21 DIAGNOSIS — Z7951 Long term (current) use of inhaled steroids: Secondary | ICD-10-CM

## 2015-07-21 HISTORY — DX: Chronic obstructive pulmonary disease, unspecified: J44.9

## 2015-07-21 HISTORY — DX: Essential (primary) hypertension: I10

## 2015-07-21 HISTORY — DX: Unspecified convulsions: R56.9

## 2015-07-21 HISTORY — DX: Cerebral infarction, unspecified: I63.9

## 2015-07-21 HISTORY — DX: Anxiety disorder, unspecified: F41.9

## 2015-07-21 LAB — HEPATIC FUNCTION PANEL
ALBUMIN: 3.3 g/dL — AB (ref 3.5–5.0)
ALT: 31 U/L (ref 17–63)
AST: 20 U/L (ref 15–41)
Alkaline Phosphatase: 64 U/L (ref 38–126)
Bilirubin, Direct: <0.1 mg/dL — ABNORMAL LOW (ref 0.1–0.5)
TOTAL PROTEIN: 5.8 g/dL — AB (ref 6.5–8.1)
Total Bilirubin: 0.5 mg/dL (ref 0.3–1.2)

## 2015-07-21 LAB — CBC
HEMATOCRIT: 34.1 % — AB (ref 39.0–52.0)
HEMOGLOBIN: 10.4 g/dL — AB (ref 13.0–17.0)
MCH: 27.1 pg (ref 26.0–34.0)
MCHC: 30.5 g/dL (ref 30.0–36.0)
MCV: 88.8 fL (ref 78.0–100.0)
Platelets: 216 10*3/uL (ref 150–400)
RBC: 3.84 MIL/uL — AB (ref 4.22–5.81)
RDW: 14.7 % (ref 11.5–15.5)
WBC: 10.7 10*3/uL — ABNORMAL HIGH (ref 4.0–10.5)

## 2015-07-21 LAB — BASIC METABOLIC PANEL
Anion gap: 12 (ref 5–15)
BUN: 15 mg/dL (ref 6–20)
CO2: 26 mmol/L (ref 22–32)
CREATININE: 0.82 mg/dL (ref 0.61–1.24)
Calcium: 8.6 mg/dL — ABNORMAL LOW (ref 8.9–10.3)
Chloride: 98 mmol/L — ABNORMAL LOW (ref 101–111)
GFR calc non Af Amer: >60 mL/min (ref >60)
GLUCOSE: 107 mg/dL — AB (ref 65–99)
Potassium: 3.5 mmol/L (ref 3.5–5.1)
Sodium: 136 mmol/L (ref 135–145)

## 2015-07-21 LAB — I-STAT TROPONIN, ED: Troponin i, poc: 0.01 ng/mL (ref 0.00–0.08)

## 2015-07-21 LAB — TROPONIN I: TROPONIN I: 0.04 ng/mL — AB (ref <0.031)

## 2015-07-21 LAB — BRAIN NATRIURETIC PEPTIDE: B Natriuretic Peptide: 54.8 pg/mL (ref 0.0–100.0)

## 2015-07-21 LAB — I-STAT CG4 LACTIC ACID, ED: LACTIC ACID, VENOUS: 1.97 mmol/L (ref 0.5–2.0)

## 2015-07-21 LAB — LIPASE, BLOOD: LIPASE: 27 U/L (ref 11–51)

## 2015-07-21 MED ORDER — DEXTROSE 5 % IV SOLN
500.0000 mg | INTRAVENOUS | Status: DC
  Administered 2015-07-22 – 2015-07-24 (×3): 500 mg via INTRAVENOUS
  Filled 2015-07-21 (×3): qty 500

## 2015-07-21 MED ORDER — ASPIRIN 81 MG PO CHEW: 324.0000 mg | CHEWABLE_TABLET | Freq: Once | ORAL | Status: DC

## 2015-07-21 MED ORDER — DEXTROSE 5 % IV SOLN
1.0000 g | INTRAVENOUS | Status: DC
  Administered 2015-07-22 – 2015-07-25 (×4): 1 g via INTRAVENOUS
  Filled 2015-07-21 (×4): qty 10

## 2015-07-21 MED ORDER — METHYLPREDNISOLONE SODIUM SUCC 125 MG IJ SOLR
125.0000 mg | Freq: Once | INTRAMUSCULAR | Status: AC
  Administered 2015-07-21: 125 mg via INTRAVENOUS
  Filled 2015-07-21: qty 2

## 2015-07-21 MED ORDER — SODIUM CHLORIDE 0.9 % IV BOLUS (SEPSIS)
1000.0000 mL | Freq: Once | INTRAVENOUS | Status: AC
  Administered 2015-07-22: 1000 mL via INTRAVENOUS

## 2015-07-21 MED ORDER — IPRATROPIUM-ALBUTEROL 0.5-2.5 (3) MG/3ML IN SOLN
3.0000 mL | Freq: Once | RESPIRATORY_TRACT | Status: AC
  Administered 2015-07-21: 3 mL via RESPIRATORY_TRACT
  Filled 2015-07-21: qty 3

## 2015-07-21 NOTE — H&P (Signed)
History and Physical  Patient Name: Dustin Price     [id number]    DOB: 1950-06-30    DOA: 07/21/2015 Referring physician: Charlesetta Shanks, MD PCP: No PCP Per Patient  [facility] "chiropractor"     Chief Complaint: Chest pain  HPI: Dustin Price is a 65 y.o. male with a past medical history significant for COPD, hx of CVA, and anxiety who presents with chest pain.  The patient presents with severe, constant, substernal and left-sided, stabbing chest pain associated with shortness of breath, and sweats. History is limited by the patient's poor history, at times stating that the pain has been present for the past two days constantly, at other times stating that he was admitted to [facility] for this same pain for four days in the past week (and that they told him "to be very careful about it" but did not tell him what it was).    The pain was severe and unabating tonight, so he called EMS from [facility] where he lives and asked them to bring him to [facility].  In the ED, the patient had a low-grade temperature 99.3 F, tachycardia, tachypnea and elevated blood pressure, and mild hypoxia.  Na 136, K 3.5, Cr 0.8, WBC 10.7K, Hgb 10.4, troponin serum 0.04, BNP normal, lipase and lactate normal.  ECG showed incomplete RBBB, with subtle/equivocal ST elevations wtihout reciprocal changes.  CXR showed hyperinflation without Radiologist identified opacity or mediastinal abnormality.  He was given solumedrol and albuterol for presumed COPD flare and TRH were asked to evaluate for admission.  Of note, he also reports "trembling" which appears to be rigors, as well as rattling cough and "like my phlegm tastes green".  He is able to walk without a cane or walker at baseline, but is so debilitated at baseline that he can't exert more than to walk from one room to the next, and can't do housework or light physical activity, so it's hard to assess if his pain is  exertional.     Review of Systems:  Pt complains of chest pain, diaphoresis, shortness of breath, tremor/rigors, cough, sputum. All other systems negative except as just noted or noted in the history of present illness.  No Known Allergies  Prior to Admission medications   Medication Sig Start Date End Date Taking? Authorizing Provider  albuterol (PROVENTIL HFA;VENTOLIN HFA) 108 (90 Base) MCG/ACT inhaler Inhale 2 puffs into the lungs every 6 (six) hours as needed for wheezing or shortness of breath.   Yes Historical Provider, MD  albuterol (PROVENTIL) (2.5 MG/3ML) 0.083% nebulizer solution Take 2.5 mg by nebulization every 6 (six) hours as needed for wheezing or shortness of breath.   Yes Historical Provider, MD  busPIRone (BUSPAR) 15 MG tablet Take 15 mg by mouth 2 (two) times daily. Reported on 07/21/2015   Yes Historical Provider, MD  docusate sodium (COLACE) 100 MG capsule Take 100 mg by mouth 2 (two) times daily.   Yes Historical Provider, MD  ferrous sulfate 325 (65 FE) MG tablet Take 325 mg by mouth daily with breakfast.   Yes Historical Provider, MD  fluticasone (FLONASE) 50 MCG/ACT nasal spray Place 1 spray into both nostrils daily.   Yes Historical Provider, MD  gabapentin (NEURONTIN) 300 MG capsule Take 300 mg by mouth 3 (three) times daily.   Yes Historical Provider, MD  hydrOXYzine (ATARAX/VISTARIL) 50 MG tablet Take 50 mg by mouth 3 (three) times daily.   Yes Historical Provider, MD  omeprazole (  PRILOSEC) 40 MG capsule Take 40 mg by mouth daily.   Yes Historical Provider, MD  PARoxetine (PAXIL) 20 MG tablet Take 20 mg by mouth daily.   Yes Historical Provider, MD  tiotropium (SPIRIVA) 18 MCG inhalation capsule Place 18 mcg into inhaler and inhale daily.   Yes Historical Provider, MD  varenicline (CHANTIX) 1 MG tablet Take 1 mg by mouth daily.   Yes Historical Provider, MD    Past Medical History  Diagnosis Date  . Stroke ([place])   . COPD (chronic obstructive pulmonary  disease) ([place])   . Hypertension   . Anxiety   . Seizures [facility])     Past Surgical History  Procedure Laterality Date  . Lung removal, partial      For "cysts on my lung"  . Brain surgery      For bleeding after stroke    Family history: family history includes Cancer in his father and mother; Heart disease in his brother.  Social History: Patient lives with his nephew in [place].  His PCP is at [facility]. He used to smoke, is around his nephew who smokes.  He walks without a cane or walker and is independent with basic ADLs, but is physically sedentary because of debilitation/deconditioning.       Physical Exam: BP 139/85 mmHg  Pulse 102  Temp(Src) 99.3 F (37.4 C) (Oral)  Resp 15  Ht 5\' 4"  (1.626 m)  Wt 68.04 kg (150 lb)  BMI 25.73 kg/m2  SpO2 99% General appearance: Thin adult male, alert and in distress from pain.  Shaking, rigors when he moves.     Eyes: Anicteric, conjunctiva pink, lids and lashes normal.     ENT: No nasal deformity, discharge, or epistaxis.  OP moist without lesions.   Lymph: No cervical or supraclavicular lymphadenopathy. Skin: Warm and dry.  Nodules on face, chronic.   Cardiac: Tachycardic, regular, nl S1-S2, no murmurs appreciated on my exam.  Capillary refill is brisk.  No JVD.  No LE edema.  Radial and DP pulses 2+ and symmetric. Respiratory: Tachypneic, poor inspiratory effort, poor air movement.  No rales or wheezes appreciated.  Lungs tight. Abdomen: Abdomen soft without rigidity.  Old non-tender verntral hernia.  No TTP. No ascites, distension.   MSK: No deformities or effusions. Neuro: Poor memory of recent medical history but oriented to person, place and time.  Sensorium intact and responding to questions, attention normal.  Speech is fluent.  Moves all extremities equally and with normal coordination. Cranial nerves normal.  Coarse tremor vs rigors. Psych: Behavior appropriate.  Affect anxious and apprehensive.  No evidence of aural  or visual hallucinations or delusions.       Labs on Admission:  The metabolic panel shows lites and renal function. Transaminases and bilirubin are normal. The troponin is 0.04 ng per mL. The BNP is normal. The lipase is normal. The lactate is normal. The complete blood count shows mild leukocytosis, anemia, normocytic.   Radiological Exams on Admission: Personally reviewed: Dg Chest 2 View  07/21/2015  CLINICAL DATA:  Chest pain and shortness of breath EXAM: CHEST  2 VIEW COMPARISON:  July 06, 2015 FINDINGS: There is underlying emphysematous change bilaterally, stable. There are stable scattered areas of scarring bilaterally. There is no edema or consolidation. The heart size is within normal limits. The pulmonary vascularity is stable and reflects underlying emphysema. Patient is status post median sternotomy. No adenopathy. No bone lesions. IMPRESSION: Underlying emphysematous change with scattered areas of  scarring. No edema or consolidation. Stable cardiac silhouette. Electronically Signed   By: Lowella Grip III M.D.   On: 07/21/2015 22:03    EKG: Independently reviewed. Rate 109, QTc 454.  Incomplete RBBB with repolarization abnlities typical of RB.  ST elevation in inferior leads noted by Dr. Vallery Ridge is not associated with reciprocal changes and is small.    Assessment/Plan 1. Chest pain:  This is new.  Etiology unclear.  Sharp and constant and severe for 2 days.  Differential for pain out of proportion to exam with small troponin leak and hypoxia includes dissection and PE.  Pneumonia can also cause severe pain.  MI doubted, but also included in diferential. -CTA now -Trend troponin -Repeat ECG now to rule out evoluation of equivocal/subtle ST elevation noted on ER EKG, if repeat unchanged, STEMI ruled out -Echocardiogram ordered for tomorrow -Obtain outside records from both [facility] and [place] to see if patient has previous heart history, echo, or catheterization  data -Empiric ceftriaxone and azithromycin -If CTA shows pneumonia rather than dissection/PE, will complete sepsis bundle, given hypoxic respiratory failure -Follow blood cultures, sputum culture, S pneumo and legionella antigens, HIV testing   2. Anemia:  Normocytic. Unclear etiology.   -Continue iron  3. Anxiety:  -Continue home buspirone, paroxetine, hydroxyzine  4. COPD:  -Continue home tiotropium, Flonase, PPI -Levalbuterol PRN overnight given tremor  5. Smoking:  Smoking cessation was encouraged.   -Smoking nursing counseling per COPD bundle      DVT PPx: SCDs until results of CTA Diet: Heart healthy Consultants: None Code Status: FULL Family Communication: None present  Medical decision making: Outside records were [facility] from [facility] and [facility], the case was discussed with Dr. Johnney Killian and Dr. Tommi Rumps from Cardiology. Patient seen 11:40 PM on 07/21/2015.  Disposition Plan:  I recommend admission to stepdown, inpatient status for chest pain.  Clinical condition: stable, but ongoing chest pain, etiology unclear.  Anticipate CTA and ancillary studies.  Further disposition pending studies. If CTA negative, and no further fever, will consult with Cardiology and possibly discharge tomorrow.      Edwin Dada [facility] Pager [phone]

## 2015-07-21 NOTE — ED Notes (Signed)
Took 324 ASA, 2 NTG. Was seen at [facility] 2 days ago for same thing.

## 2015-07-21 NOTE — ED Provider Notes (Signed)
CSN: [id number]     Arrival date & time 07/21/15  2052 History   First MD Initiated Contact with Patient 07/21/15 2106     Chief Complaint  Patient presents with  . Chest Pain     (Consider location/radiation/quality/duration/timing/severity/associated sxs/prior Treatment) HPI Patient states he's had chest pain for 3 days. He reports this is central and throughout all of his chest. He reports associated shortness of breath. He reports cough without sputum production. Denies fever. Patient reports being hospitalized for 4 days and discharged this morning from [facility]. He reports they told him that he might have had a heart attack. He states that his chest pain has never really gone away. He reports he also has COPD and is taking Chantix to quit smoking. He states he needs a nebulizer treatment. He has some chronic lower extremity edema he reports not worse than usual. He also reports some generalized headache without focal weakness numbness or tingling. Past Medical History  Diagnosis Date  . Stroke ([place])   . COPD (chronic obstructive pulmonary disease) ([place])   . Hypertension   . Anxiety   . Seizures [facility])    Past Surgical History  Procedure Laterality Date  . Lung removal, partial      For "cysts on my lung"  . Brain surgery      For bleeding after stroke   Family History  Problem Relation Age of Onset  . Cancer Mother   . Cancer Father   . Heart disease Brother    Social History  Substance Use Topics  . Smoking status: Former Research scientist (life sciences)  . Smokeless tobacco: None  . Alcohol Use: None    Review of Systems 10 Systems reviewed and are negative for acute change except as noted in the HPI.    Allergies  Review of patient's allergies indicates no known allergies.  Home Medications   Prior to Admission medications   Medication Sig Start Date End Date Taking? Authorizing Provider  albuterol (PROVENTIL HFA;VENTOLIN HFA) 108 (90 Base) MCG/ACT inhaler Inhale 2 puffs into the lungs  every 6 (six) hours as needed for wheezing or shortness of breath.   Yes Historical Provider, MD  albuterol (PROVENTIL) (2.5 MG/3ML) 0.083% nebulizer solution Take 2.5 mg by nebulization every 6 (six) hours as needed for wheezing or shortness of breath.   Yes Historical Provider, MD  busPIRone (BUSPAR) 15 MG tablet Take 15 mg by mouth 2 (two) times daily. Reported on 07/21/2015   Yes Historical Provider, MD  docusate sodium (COLACE) 100 MG capsule Take 100 mg by mouth 2 (two) times daily.   Yes Historical Provider, MD  ferrous sulfate 325 (65 FE) MG tablet Take 325 mg by mouth daily with breakfast.   Yes Historical Provider, MD  fluticasone (FLONASE) 50 MCG/ACT nasal spray Place 1 spray into both nostrils daily.   Yes Historical Provider, MD  gabapentin (NEURONTIN) 300 MG capsule Take 300 mg by mouth 3 (three) times daily.   Yes Historical Provider, MD  hydrOXYzine (ATARAX/VISTARIL) 50 MG tablet Take 50 mg by mouth 3 (three) times daily.   Yes Historical Provider, MD  omeprazole (PRILOSEC) 40 MG capsule Take 40 mg by mouth daily.   Yes Historical Provider, MD  PARoxetine (PAXIL) 20 MG tablet Take 20 mg by mouth daily.   Yes Historical Provider, MD  tiotropium (SPIRIVA) 18 MCG inhalation capsule Place 18 mcg into inhaler and inhale daily.   Yes Historical Provider, MD  varenicline (CHANTIX) 1 MG tablet Take 1 mg by  mouth daily.   Yes Historical Provider, MD   BP 139/85 mmHg  Pulse 102  Temp(Src) 99.3 F (37.4 C) (Oral)  Resp 15  Ht 5\' 4"  (1.626 m)  Wt 150 lb (68.04 kg)  BMI 25.73 kg/m2  SpO2 99% Physical Exam  Constitutional: He is oriented to person, place, and time.  Patient is complaining of pain. He is alert and nontoxic. He has tachypnea and intermittent cough. Patient is deconditioned in appearance.  HENT:  Head: Normocephalic and atraumatic.  Mouth/Throat: Oropharynx is clear and moist.  Eyes: EOM are normal. Pupils are equal, round, and reactive to light.  Cardiovascular: Normal  rate, regular rhythm, normal heart sounds and intact distal pulses.   Pulmonary/Chest:  Soft breath sounds suggestive of COPD. Usual wheeze at the base. Intermittent cough with deep inspiration.  Abdominal: Soft. Bowel sounds are normal. He exhibits no distension. There is no tenderness.  Musculoskeletal:  1-2+ pitting edema bilateral lower extremities. No Tenderness.  Neurological: He is alert and oriented to person, place, and time. Coordination normal.  Patient's baseline speech is somewhat difficult to understand. This appears to be more speech pattern related. He does not have evident cognitive deficit. He is moving all extremities with symmetric use.  Skin: Skin is warm and dry.  Skin is very dry and scaling on the lower extremities.    ED Course  Procedures (including critical care time) Labs Review Labs Reviewed  BASIC METABOLIC PANEL - Abnormal; Notable for the following:    Chloride 98 (*)    Glucose, Bld 107 (*)    Calcium 8.6 (*)    All other components within normal limits  CBC - Abnormal; Notable for the following:    WBC 10.7 (*)    RBC 3.84 (*)    Hemoglobin 10.4 (*)    HCT 34.1 (*)    All other components within normal limits  HEPATIC FUNCTION PANEL - Abnormal; Notable for the following:    Total Protein 5.8 (*)    Albumin 3.3 (*)    Bilirubin, Direct <0.1 (*)    All other components within normal limits  TROPONIN I - Abnormal; Notable for the following:    Troponin I 0.04 (*)    All other components within normal limits  CULTURE, BLOOD (ROUTINE X 2)  CULTURE, BLOOD (ROUTINE X 2)  CULTURE, EXPECTORATED SPUTUM-ASSESSMENT  GRAM STAIN  LIPASE, BLOOD  BRAIN NATRIURETIC PEPTIDE  HIV ANTIBODY (ROUTINE TESTING)  STREP PNEUMONIAE URINARY ANTIGEN  LEGIONELLA PNEUMOPHILA SEROGP 1 UR AG  I-STAT TROPOININ, ED  I-STAT CG4 LACTIC ACID, ED    Imaging Review Dg Chest 2 View  07/21/2015  CLINICAL DATA:  Chest pain and shortness of breath EXAM: CHEST  2 VIEW  COMPARISON:  July 06, 2015 FINDINGS: There is underlying emphysematous change bilaterally, stable. There are stable scattered areas of scarring bilaterally. There is no edema or consolidation. The heart size is within normal limits. The pulmonary vascularity is stable and reflects underlying emphysema. Patient is status post median sternotomy. No adenopathy. No bone lesions. IMPRESSION: Underlying emphysematous change with scattered areas of scarring. No edema or consolidation. Stable cardiac silhouette. Electronically Signed   By: Lowella Grip III M.D.   On: 07/21/2015 22:03   I have personally reviewed and evaluated these images and lab results as part of my medical decision-making.   EKG Interpretation   Date/Time:  Thursday July 21 2015 20:56:51 EDT Ventricular Rate:  109 PR Interval:  117 QRS Duration: 114 QT Interval:  337 QTC Calculation: 454 R Axis:   -75 Text Interpretation:  Sinus tachycardia Probable left atrial enlargement  Incomplete RBBB and LAFB ST elevation, consider inferior injury agree  Confirmed by Johnney Killian, MD, Jeannie Done [phone]) on 07/21/2015 9:08:06 PM     Consult: [facility] for admission MDM   Final diagnoses:  Chest pain, unspecified chest pain type  Chronic obstructive pulmonary disease, unspecified COPD type ([place])   Patient reports history of similar chest pain for several days. At this time he does not have elevated troponin. He does have right bundle branch block but does not appear to be a STEMI. At this time, old records are not available in [place] although patient reports recent hospitalization at [facility]. Patient also has history of COPD with cough and dyspnea. He is stable with stable vital signs. Patient will be admitted for rule out MI and COPD exacerbation.    Charlesetta Shanks, MD 07/22/15 [phone]

## 2015-07-21 NOTE — ED Notes (Signed)
Pt in EMS reporting central CP, non radiating, diminished breath sounds lower lobes. Chronic CP issues. Hx COPD.

## 2015-07-22 ENCOUNTER — Other Ambulatory Visit (HOSPITAL_COMMUNITY): Payer: Medicare Other

## 2015-07-22 ENCOUNTER — Inpatient Hospital Stay (HOSPITAL_COMMUNITY): Payer: Medicare Other

## 2015-07-22 DIAGNOSIS — J441 Chronic obstructive pulmonary disease with (acute) exacerbation: Secondary | ICD-10-CM | POA: Diagnosis present

## 2015-07-22 DIAGNOSIS — R0781 Pleurodynia: Secondary | ICD-10-CM

## 2015-07-22 DIAGNOSIS — I10 Essential (primary) hypertension: Secondary | ICD-10-CM

## 2015-07-22 DIAGNOSIS — F419 Anxiety disorder, unspecified: Secondary | ICD-10-CM

## 2015-07-22 LAB — BASIC METABOLIC PANEL
Anion gap: 11 (ref 5–15)
BUN: 11 mg/dL (ref 6–20)
CHLORIDE: 100 mmol/L — AB (ref 101–111)
CO2: 27 mmol/L (ref 22–32)
Calcium: 8.2 mg/dL — ABNORMAL LOW (ref 8.9–10.3)
Creatinine, Ser: 0.71 mg/dL (ref 0.61–1.24)
Glucose, Bld: 135 mg/dL — ABNORMAL HIGH (ref 65–99)
POTASSIUM: 4.4 mmol/L (ref 3.5–5.1)
SODIUM: 138 mmol/L (ref 135–145)

## 2015-07-22 LAB — RAPID URINE DRUG SCREEN, HOSP PERFORMED
AMPHETAMINES: NOT DETECTED
BARBITURATES: NOT DETECTED
Benzodiazepines: NOT DETECTED
COCAINE: NOT DETECTED
OPIATES: NOT DETECTED
TETRAHYDROCANNABINOL: NOT DETECTED

## 2015-07-22 LAB — EXPECTORATED SPUTUM ASSESSMENT W GRAM STAIN, RFLX TO RESP C

## 2015-07-22 LAB — CBC
HEMATOCRIT: 33.4 % — AB (ref 39.0–52.0)
HEMOGLOBIN: 10.3 g/dL — AB (ref 13.0–17.0)
MCH: 27.2 pg (ref 26.0–34.0)
MCHC: 30.8 g/dL (ref 30.0–36.0)
MCV: 88.4 fL (ref 78.0–100.0)
Platelets: 214 10*3/uL (ref 150–400)
RBC: 3.78 MIL/uL — ABNORMAL LOW (ref 4.22–5.81)
RDW: 14.7 % (ref 11.5–15.5)
WBC: 8.9 10*3/uL (ref 4.0–10.5)

## 2015-07-22 LAB — STREP PNEUMONIAE URINARY ANTIGEN: STREP PNEUMO URINARY ANTIGEN: NEGATIVE

## 2015-07-22 LAB — TROPONIN I
Troponin I: 0.03 ng/mL (ref <0.031)
Troponin I: 0.03 ng/mL (ref <0.031)
Troponin I: 0.03 ng/mL (ref <0.031)

## 2015-07-22 LAB — EXPECTORATED SPUTUM ASSESSMENT W REFEX TO RESP CULTURE

## 2015-07-22 LAB — INFLUENZA PANEL BY PCR (TYPE A & B)
H1N1 flu by pcr: NOT DETECTED
INFLAPCR: NEGATIVE
Influenza B By PCR: NEGATIVE

## 2015-07-22 LAB — PROCALCITONIN: Procalcitonin: <0.1 ng/mL

## 2015-07-22 LAB — HIV ANTIBODY (ROUTINE TESTING W REFLEX): HIV Screen 4th Generation wRfx: NONREACTIVE

## 2015-07-22 LAB — MRSA PCR SCREENING: MRSA BY PCR: NEGATIVE

## 2015-07-22 MED ORDER — MORPHINE SULFATE (PF) 2 MG/ML IV SOLN: 2.0000 mg | INTRAVENOUS | Status: DC | PRN

## 2015-07-22 MED ORDER — ACETAMINOPHEN 650 MG RE SUPP: 650.0000 mg | Freq: Four times a day (QID) | RECTAL | Status: DC | PRN

## 2015-07-22 MED ORDER — DOCUSATE SODIUM 100 MG PO CAPS
100.0000 mg | ORAL_CAPSULE | Freq: Two times a day (BID) | ORAL | Status: DC
  Administered 2015-07-22 – 2015-07-25 (×7): 100 mg via ORAL
  Filled 2015-07-22 (×6): qty 1

## 2015-07-22 MED ORDER — LEVALBUTEROL HCL 0.63 MG/3ML IN NEBU
0.6300 mg | INHALATION_SOLUTION | Freq: Four times a day (QID) | RESPIRATORY_TRACT | Status: DC | PRN
  Administered 2015-07-22 – 2015-07-25 (×12): 0.63 mg via RESPIRATORY_TRACT
  Filled 2015-07-22 (×14): qty 3

## 2015-07-22 MED ORDER — GABAPENTIN 300 MG PO CAPS
300.0000 mg | ORAL_CAPSULE | Freq: Three times a day (TID) | ORAL | Status: DC
  Administered 2015-07-22 – 2015-07-25 (×10): 300 mg via ORAL
  Filled 2015-07-22 (×11): qty 1

## 2015-07-22 MED ORDER — IOPAMIDOL (ISOVUE-370) INJECTION 76%
INTRAVENOUS | Status: AC
  Administered 2015-07-22: 100 mL
  Filled 2015-07-22: qty 100

## 2015-07-22 MED ORDER — SODIUM CHLORIDE 0.9% FLUSH
3.0000 mL | Freq: Two times a day (BID) | INTRAVENOUS | Status: DC
  Administered 2015-07-22 – 2015-07-25 (×6): 3 mL via INTRAVENOUS

## 2015-07-22 MED ORDER — ONDANSETRON HCL 4 MG PO TABS: 4.0000 mg | ORAL_TABLET | Freq: Four times a day (QID) | ORAL | Status: DC | PRN

## 2015-07-22 MED ORDER — GUAIFENESIN ER 600 MG PO TB12
600.0000 mg | ORAL_TABLET | Freq: Two times a day (BID) | ORAL | Status: DC
  Administered 2015-07-22 – 2015-07-25 (×7): 600 mg via ORAL
  Filled 2015-07-22 (×7): qty 1

## 2015-07-22 MED ORDER — TRAMADOL HCL 50 MG PO TABS: 50.0000 mg | ORAL_TABLET | Freq: Four times a day (QID) | ORAL | Status: DC | PRN

## 2015-07-22 MED ORDER — HYDROXYZINE HCL 25 MG PO TABS
50.0000 mg | ORAL_TABLET | Freq: Three times a day (TID) | ORAL | Status: DC
  Administered 2015-07-22 – 2015-07-25 (×10): 50 mg via ORAL
  Filled 2015-07-22 (×10): qty 2

## 2015-07-22 MED ORDER — FLUTICASONE PROPIONATE 50 MCG/ACT NA SUSP
1.0000 | Freq: Every day | NASAL | Status: DC
  Administered 2015-07-22 – 2015-07-25 (×4): 1 via NASAL
  Filled 2015-07-22: qty 16

## 2015-07-22 MED ORDER — METHOCARBAMOL 500 MG PO TABS
500.0000 mg | ORAL_TABLET | Freq: Four times a day (QID) | ORAL | Status: DC | PRN
  Administered 2015-07-22 – 2015-07-23 (×2): 500 mg via ORAL
  Filled 2015-07-22 (×2): qty 1

## 2015-07-22 MED ORDER — SENNOSIDES-DOCUSATE SODIUM 8.6-50 MG PO TABS
1.0000 | ORAL_TABLET | Freq: Every evening | ORAL | Status: DC | PRN
  Filled 2015-07-22: qty 1

## 2015-07-22 MED ORDER — BUSPIRONE HCL 5 MG PO TABS
15.0000 mg | ORAL_TABLET | Freq: Two times a day (BID) | ORAL | Status: DC
  Administered 2015-07-22: 15 mg via ORAL
  Administered 2015-07-22: 5 mg via ORAL
  Administered 2015-07-23 – 2015-07-25 (×5): 15 mg via ORAL
  Filled 2015-07-22 (×8): qty 1

## 2015-07-22 MED ORDER — PANTOPRAZOLE SODIUM 40 MG PO TBEC
40.0000 mg | DELAYED_RELEASE_TABLET | Freq: Every day | ORAL | Status: DC
  Administered 2015-07-22 – 2015-07-25 (×4): 40 mg via ORAL
  Filled 2015-07-22 (×4): qty 1

## 2015-07-22 MED ORDER — TIOTROPIUM BROMIDE MONOHYDRATE 18 MCG IN CAPS
18.0000 ug | ORAL_CAPSULE | Freq: Every day | RESPIRATORY_TRACT | Status: DC
  Administered 2015-07-22 – 2015-07-25 (×3): 18 ug via RESPIRATORY_TRACT
  Filled 2015-07-22 (×2): qty 5

## 2015-07-22 MED ORDER — FERROUS SULFATE 325 (65 FE) MG PO TABS
325.0000 mg | ORAL_TABLET | Freq: Every day | ORAL | Status: DC
  Administered 2015-07-22 – 2015-07-25 (×4): 325 mg via ORAL
  Filled 2015-07-22 (×4): qty 1

## 2015-07-22 MED ORDER — ACETAMINOPHEN 325 MG PO TABS
650.0000 mg | ORAL_TABLET | Freq: Four times a day (QID) | ORAL | Status: DC | PRN
  Administered 2015-07-23 – 2015-07-24 (×3): 650 mg via ORAL
  Filled 2015-07-22 (×3): qty 2

## 2015-07-22 MED ORDER — ONDANSETRON HCL 4 MG/2ML IJ SOLN: 4.0000 mg | Freq: Four times a day (QID) | INTRAMUSCULAR | Status: DC | PRN

## 2015-07-22 MED ORDER — METHYLPREDNISOLONE SODIUM SUCC 125 MG IJ SOLR
60.0000 mg | Freq: Two times a day (BID) | INTRAMUSCULAR | Status: DC
  Administered 2015-07-22 – 2015-07-23 (×3): 60 mg via INTRAVENOUS
  Filled 2015-07-22 (×3): qty 2

## 2015-07-22 MED ORDER — PAROXETINE HCL 20 MG PO TABS
20.0000 mg | ORAL_TABLET | Freq: Every day | ORAL | Status: DC
  Administered 2015-07-22 – 2015-07-25 (×4): 20 mg via ORAL
  Filled 2015-07-22 (×4): qty 1

## 2015-07-22 NOTE — Plan of Care (Signed)
Problem: Safety: Goal: Ability to remain free from injury will improve Outcome: Progressing Instructed patient to call for assistance and explained abot the bed alarm and why we use it, all personal items at patient's bedside and patient verbalized understanding and will use call light if he needs assistance.

## 2015-07-22 NOTE — Progress Notes (Signed)
[facility] Progress Note  Patient: Dustin Price [id number]   PCP: No PCP Per Patient DOB: December 07, 1950   DOA: 07/21/2015   DOS: 07/22/2015   Date of Service: the patient was seen and examined on 07/22/2015  Subjective: Patient continues to have complaints of generalized body ache. Chest pain is improving but for this pleuritic in nature. No nausea no vomiting or shortness of breath. Continues to have some cough with expectoration Nutrition: Tolerating oral diet  Brief hospital course: Patient was admitted on 07/21/2015, with complaint of chest pain, was found to have COPD exacerbation. Currently further plan is continue treating for bronchitis with antibiotics and nebulizers.  Assessment and Plan: 1. Chest pain Pleuritic in nature likely noncardiac. Negative troponin. CT chest negative for pulmonary embolism. CT chest also negative for pneumonia. Awaiting echocardiogram. Continue to monitor on telemetry. Avoid IV Narcotic medications.  2. COPD exacerbation with bronchitis. Bilateral expiratory wheezing present.  Influenza PCR negative. Continue following blood cultures, sputum culture. Continue antibiotics, continue Solu-Medrol 60 every 12 hours. Continue duo nebs.  3. Active smoker. Nicotine patch. Smoking cessation counseling was done.  Activity: Currently independent with ambulating with assistance Bowel regimen: last BM 07/19/2015 DVT Prophylaxis: subcutaneous Heparin Nutrition: Cardiac diet Advance goals of care discussion: Full code  Procedures: None Consultants: None Antibiotics: Anti-infectives    Start     Dose/Rate Route Frequency Ordered Stop   07/22/15 0000  cefTRIAXone (ROCEPHIN) 1 g in dextrose 5 % 50 mL IVPB     1 g 100 mL/hr over 30 Minutes Intravenous Every 24 hours 07/21/15 2346 07/28/15 2359   07/22/15 0000  azithromycin (ZITHROMAX) 500 mg in dextrose 5 % 250 mL IVPB     500 mg 250 mL/hr over 60 Minutes Intravenous Every 24 hours 07/21/15 2346  07/28/15 2359       Family Communication: no family was present at bedside, at the time of interview.   Disposition:  Expected discharge date: 07/23/2015 Barriers to safe discharge: Improvement in oxygenation, echocardiogram   Intake/Output Summary (Last 24 hours) at 07/22/15 1533 Last data filed at 07/22/15 1018  Gross per 24 hour  Intake    423 ml  Output    750 ml  Net   -327 ml   Filed Weights   07/21/15 2057 07/22/15 0150  Weight: 68.04 kg (150 lb) 66 kg (145 lb 8.1 oz)    Objective: Physical Exam: Filed Vitals:   07/22/15 0030 07/22/15 0150 07/22/15 0400 07/22/15 0854  BP: 168/92  140/85   Pulse: 107     Temp:  98.8 F (37.1 C)    TempSrc:  Oral    Resp: 21  19   Height:  5\' 4"  (1.626 m)    Weight:  66 kg (145 lb 8.1 oz)    SpO2: 88%   99%     General: Appear in mild distress, no Rash; Oral Mucosa moist. Cardiovascular: S1 and S2 Present, no Murmur, no JVD Respiratory: Bilateral Air entry present and bilateral expiratory wheezes Abdomen: Bowel Sound present, Soft and no tenderness Extremities: no Pedal edema, no calf tenderness Neurology: Grossly no focal neuro deficit.  Data Reviewed: CBC:  Recent Labs Lab 07/21/15 2138 07/22/15 0507  WBC 10.7* 8.9  HGB 10.4* 10.3*  HCT 34.1* 33.4*  MCV 88.8 88.4  PLT 216 [id number]   Basic Metabolic Panel:  Recent Labs Lab 07/21/15 2138 07/22/15 0507  NA 136 138  K 3.5 4.4  CL 98* 100*  CO2 26 27  GLUCOSE  107* 135*  BUN 15 11  CREATININE 0.82 0.71  CALCIUM 8.6* 8.2*   Liver Function Tests:  Recent Labs Lab 07/21/15 2138  AST 20  ALT 31  ALKPHOS 64  BILITOT 0.5  PROT 5.8*  ALBUMIN 3.3*    Recent Labs Lab 07/21/15 2138  LIPASE 27   No results for input(s): AMMONIA in the last 168 hours.  Cardiac Enzymes:  Recent Labs Lab 07/21/15 2138 07/22/15 0207 07/22/15 0507 07/22/15 0735  TROPONINI 0.04* 0.03 0.03 0.03    BNP (last 3 results)  Recent Labs  07/21/15 2138  BNP 54.8     CBG: No results for input(s): GLUCAP in the last 168 hours.  Recent Results (from the past 240 hour(s))  MRSA PCR Screening     Status: None   Collection Time: 07/22/15  1:49 AM  Result Value Ref Range Status   MRSA by PCR NEGATIVE NEGATIVE Final    Comment:        The GeneXpert MRSA Assay (FDA approved for NASAL specimens only), is one component of a comprehensive MRSA colonization surveillance program. It is not intended to diagnose MRSA infection nor to guide or monitor treatment for MRSA infections.      Studies: Dg Chest 2 View  07/21/2015  CLINICAL DATA:  Chest pain and shortness of breath EXAM: CHEST  2 VIEW COMPARISON:  July 06, 2015 FINDINGS: There is underlying emphysematous change bilaterally, stable. There are stable scattered areas of scarring bilaterally. There is no edema or consolidation. The heart size is within normal limits. The pulmonary vascularity is stable and reflects underlying emphysema. Patient is status post median sternotomy. No adenopathy. No bone lesions. IMPRESSION: Underlying emphysematous change with scattered areas of scarring. No edema or consolidation. Stable cardiac silhouette. Electronically Signed   By: Lowella Grip III M.D.   On: 07/21/2015 22:03   Ct Angio Chest Pe W/cm &/or Wo Cm  07/22/2015  CLINICAL DATA:  Chest pain for 3 days. EXAM: CT ANGIOGRAPHY CHEST WITH CONTRAST TECHNIQUE: Multidetector CT imaging of the chest was performed using the standard protocol during bolus administration of intravenous contrast. Multiplanar CT image reconstructions and MIPs were obtained to evaluate the vascular anatomy. CONTRAST:  100 mL Isovue 370 intravenous COMPARISON:  Radiographs 07/21/2015, CT 08/07/2014 FINDINGS: Cardiovascular: There is good opacification of the pulmonary arteries. There is no pulmonary embolism. The thoracic aorta is normal in caliber and intact. Lungs: Severe bullous emphysematous disease bilaterally. No confluent  consolidation. No masses or suspicious nodules. No pneumothorax. Central airways: Retained secretions or aspiration in the right mainstem bronchus and right lower lobe bronchus. Effusions: None Lymphadenopathy: None Esophagus: Unremarkable Upper abdomen: Right renal cyst, unchanged. Musculoskeletal: No significant abnormality. Review of the MIP images confirms the above findings. IMPRESSION: 1. Negative for acute pulmonary embolism. 2. Severe bullous emphysema. 3. Retained secretions or aspiration in the right mainstem bronchus and right lower lobe bronchus. Electronically Signed   By: Andreas Newport M.D.   On: 07/22/2015 02:31     Scheduled Meds: . azithromycin  500 mg Intravenous Q24H  . busPIRone  15 mg Oral BID  . cefTRIAXone (ROCEPHIN)  IV  1 g Intravenous Q24H  . docusate sodium  100 mg Oral BID  . ferrous sulfate  325 mg Oral Q breakfast  . fluticasone  1 spray Each Nare Daily  . gabapentin  300 mg Oral TID  . guaiFENesin  600 mg Oral BID  . hydrOXYzine  50 mg Oral TID  . methylPREDNISolone (SOLU-MEDROL)  injection  60 mg Intravenous Q12H  . pantoprazole  40 mg Oral Daily  . PARoxetine  20 mg Oral Daily  . sodium chloride flush  3 mL Intravenous Q12H  . tiotropium  18 mcg Inhalation Daily   Continuous Infusions:  PRN Meds: acetaminophen **OR** acetaminophen, levalbuterol, methocarbamol, ondansetron **OR** ondansetron (ZOFRAN) IV, senna-docusate, traMADol  Time spent: 30 minutes  Author: Berle Mull, MD [facility] Pager: [phone] 07/22/2015 3:33 PM  If 7PM-7AM, please contact night-coverage at [email or web address], password [facility]

## 2015-07-22 NOTE — Progress Notes (Signed)
Repeat ECG shows no concerning change.  Troponins trending down.  Procalcitonin pending.  CT angiogram shows no source of patient's pain, nor pneumonia.

## 2015-07-22 NOTE — Plan of Care (Signed)
Problem: Education: Goal: Knowledge of [facility] Education information/materials will improve Outcome: Progressing Patient given general rules of the unit and where things are. Patient shown how to use call light and the white board for NT/RN phone numbers and pertinent info and patient verbalized understanding

## 2015-07-22 NOTE — Progress Notes (Signed)
Updated report received via Engineer, civil (consulting) format, reviewed new orders, VS, test results, meds and events of the day, assumed care of patient

## 2015-07-23 ENCOUNTER — Inpatient Hospital Stay (HOSPITAL_COMMUNITY): Payer: Medicare Other

## 2015-07-23 DIAGNOSIS — R079 Chest pain, unspecified: Secondary | ICD-10-CM

## 2015-07-23 LAB — COMPREHENSIVE METABOLIC PANEL
ALT: 25 U/L (ref 17–63)
ANION GAP: 9 (ref 5–15)
AST: 15 U/L (ref 15–41)
Albumin: 3.2 g/dL — ABNORMAL LOW (ref 3.5–5.0)
Alkaline Phosphatase: 63 U/L (ref 38–126)
BILIRUBIN TOTAL: 0.4 mg/dL (ref 0.3–1.2)
BUN: 16 mg/dL (ref 6–20)
CHLORIDE: 101 mmol/L (ref 101–111)
CO2: 29 mmol/L (ref 22–32)
Calcium: 8.6 mg/dL — ABNORMAL LOW (ref 8.9–10.3)
Creatinine, Ser: 0.82 mg/dL (ref 0.61–1.24)
GFR calc Af Amer: >60 mL/min (ref >60)
Glucose, Bld: 128 mg/dL — ABNORMAL HIGH (ref 65–99)
POTASSIUM: 4.2 mmol/L (ref 3.5–5.1)
Sodium: 139 mmol/L (ref 135–145)
Total Protein: 6 g/dL — ABNORMAL LOW (ref 6.5–8.1)

## 2015-07-23 LAB — ECHOCARDIOGRAM COMPLETE
HEIGHTINCHES: 64 in
Weight: 2328.06 oz

## 2015-07-23 LAB — CBC WITH DIFFERENTIAL/PLATELET
BASOS PCT: 0 %
Basophils Absolute: 0 10*3/uL (ref 0.0–0.1)
EOS PCT: 0 %
Eosinophils Absolute: 0 10*3/uL (ref 0.0–0.7)
HEMATOCRIT: 31.9 % — AB (ref 39.0–52.0)
Hemoglobin: 10.3 g/dL — ABNORMAL LOW (ref 13.0–17.0)
LYMPHS PCT: 3 %
Lymphs Abs: 0.4 10*3/uL — ABNORMAL LOW (ref 0.7–4.0)
MCH: 28.5 pg (ref 26.0–34.0)
MCHC: 32.3 g/dL (ref 30.0–36.0)
MCV: 88.4 fL (ref 78.0–100.0)
MONO ABS: 0.2 10*3/uL (ref 0.1–1.0)
MONOS PCT: 2 %
NEUTROS ABS: 11.7 10*3/uL — AB (ref 1.7–7.7)
Neutrophils Relative %: 95 %
Platelets: 237 10*3/uL (ref 150–400)
RBC: 3.61 MIL/uL — ABNORMAL LOW (ref 4.22–5.81)
RDW: 14.6 % (ref 11.5–15.5)
WBC: 12.3 10*3/uL — ABNORMAL HIGH (ref 4.0–10.5)

## 2015-07-23 LAB — MAGNESIUM: Magnesium: 2 mg/dL (ref 1.7–2.4)

## 2015-07-23 LAB — LEGIONELLA PNEUMOPHILA SEROGP 1 UR AG: L. PNEUMOPHILA SEROGP 1 UR AG: NEGATIVE

## 2015-07-23 MED ORDER — PREDNISONE 20 MG PO TABS
50.0000 mg | ORAL_TABLET | Freq: Every day | ORAL | Status: DC
  Administered 2015-07-24 – 2015-07-25 (×2): 50 mg via ORAL
  Filled 2015-07-23 (×2): qty 2

## 2015-07-23 MED ORDER — PERFLUTREN LIPID MICROSPHERE
INTRAVENOUS | Status: AC
  Filled 2015-07-23: qty 10

## 2015-07-23 MED ORDER — PERFLUTREN LIPID MICROSPHERE
1.0000 mL | INTRAVENOUS | Status: AC | PRN
  Administered 2015-07-23: 2 mL via INTRAVENOUS
  Filled 2015-07-23: qty 10

## 2015-07-23 MED ORDER — WHITE PETROLATUM GEL
Status: AC
  Administered 2015-07-23: 1
  Filled 2015-07-23: qty 1

## 2015-07-23 NOTE — Progress Notes (Addendum)
[facility] Progress Note  Patient: Dustin Price [id number]   PCP: No PCP Per Patient DOB: Aug 14, 1950   DOA: 07/21/2015   DOS: 07/23/2015   Date of Service: the patient was seen and examined on 07/23/2015  Subjective: No acute complaint reported by the patient other than complaining of fatigue. Also complains of shortness of breath which is chronic. No chest pain and nausea and vomiting Nutrition: Tolerating oral diet  Brief hospital course: Patient was admitted on 07/21/2015, with complaint of chest pain, was found to have COPD exacerbation. His sputum culture growing gram-negative rods. Currently further plan is continue treating for bronchitis with antibiotics and nebulizers.  Assessment and Plan: 1. Chest pain Pleuritic in nature likely noncardiac. Negative troponin. CT chest negative for pulmonary embolism. CT chest also negative for pneumonia. Echocardiogram shows normal ejection fraction and no wall motion abnormality. Continue to monitor on telemetry. Avoid IV Narcotic medications.  2. COPD exacerbation with bronchitis. Bilateral expiratory wheezing present.  Influenza PCR negative. Continue following blood cultures, sputum culture. Continue antibiotics,  Transition to oral prednisone starting tomorrow. Continue duo nebs. Check oxygenation with ambulation.  3. Active smoker. Nicotine patch. Smoking cessation counseling was done.  4.difficulty walking. Patient complains of having generalized weakness and fatigue. Nursing requested physical therapy consult. Consult placed.  Activity:walking in the room Bowel regimen: last BM 07/22/2015 DVT Prophylaxis: subcutaneous Heparin Nutrition: Cardiac diet Advance goals of care discussion: Full code  Procedures: echocardiogram Consultants: None Antibiotics: Anti-infectives    Start     Dose/Rate Route Frequency Ordered Stop   07/22/15 0000  cefTRIAXone (ROCEPHIN) 1 g in dextrose 5 % 50 mL IVPB     1 g 100  mL/hr over 30 Minutes Intravenous Every 24 hours 07/21/15 2346 07/28/15 2359   07/22/15 0000  azithromycin (ZITHROMAX) 500 mg in dextrose 5 % 250 mL IVPB     500 mg 250 mL/hr over 60 Minutes Intravenous Every 24 hours 07/21/15 2346 07/28/15 2359      Family Communication: no family was present at bedside, at the time of interview.   Disposition:  Expected discharge date: 07/24/2015 Barriers to safe discharge: physical therapy evaluation and oxygenation evaluation    Intake/Output Summary (Last 24 hours) at 07/23/15 1744 Last data filed at 07/23/15 1300  Gross per 24 hour  Intake    900 ml  Output    900 ml  Net      0 ml   Filed Weights   07/21/15 2057 07/22/15 0150 07/23/15 0525  Weight: 68.04 kg (150 lb) 66 kg (145 lb 8.1 oz) 66 kg (145 lb 8.1 oz)    Objective: Physical Exam: Filed Vitals:   07/23/15 0820 07/23/15 0851 07/23/15 1444 07/23/15 1505  BP:   130/77   Pulse:   86   Temp:   98 F (36.7 C)   TempSrc:   Oral   Resp:   18   Height:      Weight:      SpO2: 100% 100% 100% 99%    General: Appear in mild distress, no Rash; Oral Mucosa moist. Cardiovascular: S1 and S2 Present, no Murmur, no JVD Respiratory: Bilateral Air entry present and improving expiratory wheezes Abdomen: Bowel Sound present, Soft and no tenderness Extremities: no Pedal edema, no calf tenderness  Data Reviewed: CBC:  Recent Labs Lab 07/21/15 2138 07/22/15 0507 07/23/15 0415  WBC 10.7* 8.9 12.3*  NEUTROABS  --   --  11.7*  HGB 10.4* 10.3* 10.3*  HCT 34.1* 33.4*  31.9*  MCV 88.8 88.4 88.4  PLT 216 214 [id number]   Basic Metabolic Panel:  Recent Labs Lab 07/21/15 2138 07/22/15 0507 07/23/15 0415  NA 136 138 139  K 3.5 4.4 4.2  CL 98* 100* 101  CO2 26 27 29   GLUCOSE 107* 135* 128*  BUN 15 11 16   CREATININE 0.82 0.71 0.82  CALCIUM 8.6* 8.2* 8.6*  MG  --   --  2.0   Liver Function Tests:  Recent Labs Lab 07/21/15 2138 07/23/15 0415  AST 20 15  ALT 31 25  ALKPHOS 64 63   BILITOT 0.5 0.4  PROT 5.8* 6.0*  ALBUMIN 3.3* 3.2*    Recent Labs Lab 07/21/15 2138  LIPASE 27   No results for input(s): AMMONIA in the last 168 hours.  Cardiac Enzymes:  Recent Labs Lab 07/21/15 2138 07/22/15 0207 07/22/15 0507 07/22/15 0735  TROPONINI 0.04* 0.03 0.03 0.03    BNP (last 3 results)  Recent Labs  07/21/15 2138  BNP 54.8    CBG: No results for input(s): GLUCAP in the last 168 hours.  Recent Results (from the past 240 hour(s))  MRSA PCR Screening     Status: None   Collection Time: 07/22/15  1:49 AM  Result Value Ref Range Status   MRSA by PCR NEGATIVE NEGATIVE Final    Comment:        The GeneXpert MRSA Assay (FDA approved for NASAL specimens only), is one component of a comprehensive MRSA colonization surveillance program. It is not intended to diagnose MRSA infection nor to guide or monitor treatment for MRSA infections.   Culture, sputum-assessment     Status: None   Collection Time: 07/22/15  8:23 PM  Result Value Ref Range Status   Specimen Description SPUTUM  Final   Special Requests NONE  Final   Sputum evaluation   Final    THIS SPECIMEN IS ACCEPTABLE. RESPIRATORY CULTURE REPORT TO FOLLOW.   Report Status 07/22/2015 FINAL  Final     Studies: No results found.   Scheduled Meds: . azithromycin  500 mg Intravenous Q24H  . busPIRone  15 mg Oral BID  . cefTRIAXone (ROCEPHIN)  IV  1 g Intravenous Q24H  . docusate sodium  100 mg Oral BID  . ferrous sulfate  325 mg Oral Q breakfast  . fluticasone  1 spray Each Nare Daily  . gabapentin  300 mg Oral TID  . guaiFENesin  600 mg Oral BID  . hydrOXYzine  50 mg Oral TID  . pantoprazole  40 mg Oral Daily  . PARoxetine  20 mg Oral Daily  . perflutren lipid microspheres (DEFINITY) IV suspension      . [START ON 07/24/2015] predniSONE  50 mg Oral Q breakfast  . sodium chloride flush  3 mL Intravenous Q12H  . tiotropium  18 mcg Inhalation Daily   Continuous Infusions:  PRN Meds:  acetaminophen **OR** acetaminophen, levalbuterol, methocarbamol, ondansetron **OR** ondansetron (ZOFRAN) IV, senna-docusate, traMADol  Time spent: 30 minutes  Author: Berle Mull, MD [facility] Pager: [phone] 07/23/2015 5:44 PM  If 7PM-7AM, please contact night-coverage at [email or web address], password [email or web address]

## 2015-07-23 NOTE — Progress Notes (Signed)
  Echocardiogram 2D Echocardiogram with Definity has been performed.  Dustin Price 07/23/2015, 1:11 PM

## 2015-07-24 LAB — CBC WITH DIFFERENTIAL/PLATELET
BASOS ABS: 0 10*3/uL (ref 0.0–0.1)
BASOS PCT: 0 %
EOS ABS: 0 10*3/uL (ref 0.0–0.7)
Eosinophils Relative: 0 %
HEMATOCRIT: 30.8 % — AB (ref 39.0–52.0)
Hemoglobin: 9.9 g/dL — ABNORMAL LOW (ref 13.0–17.0)
LYMPHS ABS: 1.6 10*3/uL (ref 0.7–4.0)
Lymphocytes Relative: 14 %
MCH: 28.4 pg (ref 26.0–34.0)
MCHC: 32.1 g/dL (ref 30.0–36.0)
MCV: 88.3 fL (ref 78.0–100.0)
Monocytes Absolute: 1 10*3/uL (ref 0.1–1.0)
Monocytes Relative: 9 %
NEUTROS ABS: 9 10*3/uL — AB (ref 1.7–7.7)
Neutrophils Relative %: 77 %
Platelets: 246 10*3/uL (ref 150–400)
RBC: 3.49 MIL/uL — ABNORMAL LOW (ref 4.22–5.81)
RDW: 14.8 % (ref 11.5–15.5)
WBC: 11.6 10*3/uL — ABNORMAL HIGH (ref 4.0–10.5)

## 2015-07-24 LAB — COMPREHENSIVE METABOLIC PANEL
ALBUMIN: 3 g/dL — AB (ref 3.5–5.0)
ALT: 25 U/L (ref 17–63)
ANION GAP: 10 (ref 5–15)
AST: 16 U/L (ref 15–41)
Alkaline Phosphatase: 53 U/L (ref 38–126)
BILIRUBIN TOTAL: 0.5 mg/dL (ref 0.3–1.2)
BUN: 18 mg/dL (ref 6–20)
CALCIUM: 8.6 mg/dL — AB (ref 8.9–10.3)
CO2: 29 mmol/L (ref 22–32)
Chloride: 99 mmol/L — ABNORMAL LOW (ref 101–111)
Creatinine, Ser: 1.01 mg/dL (ref 0.61–1.24)
GFR calc non Af Amer: >60 mL/min (ref >60)
GLUCOSE: 113 mg/dL — AB (ref 65–99)
POTASSIUM: 3.3 mmol/L — AB (ref 3.5–5.1)
SODIUM: 138 mmol/L (ref 135–145)
TOTAL PROTEIN: 5.7 g/dL — AB (ref 6.5–8.1)

## 2015-07-24 LAB — MAGNESIUM: Magnesium: 2.1 mg/dL (ref 1.7–2.4)

## 2015-07-24 LAB — PROCALCITONIN

## 2015-07-24 MED ORDER — AZITHROMYCIN 250 MG PO TABS
250.0000 mg | ORAL_TABLET | Freq: Every day | ORAL | Status: DC
  Administered 2015-07-24: 250 mg via ORAL
  Filled 2015-07-24: qty 1

## 2015-07-24 NOTE — Progress Notes (Signed)
Pt c/o generalized pain, Tylenol given. Pt resting. Requesting Q6H breathing tx's

## 2015-07-24 NOTE — Progress Notes (Signed)
Patient called to nurses station stating his chest was hurting.  RN asked tech to complete an EKG.  EKG complete and RN in room and asked patient if he was in pain.  Patient stated yes my feet and my right lower side are hurting.  RN then stated I thought you said your chest was hurting and patient replied oh yeah that is too.  Per patient in feet, right lower side and left side of chest pain is a 9/10.  Per patient left chest pain is throbbing pain.  Right lower side per patient feels like needles sticking him and in patient's feet pain "it just hurts, that's all it's been like that for a long time".  RN asked patient if he would like to try Tylenol for the pain and patient stated yes.  RN administered PRN Tylenol per MD orders.

## 2015-07-24 NOTE — Progress Notes (Signed)
[facility] Progress Note  Patient: Dustin Price [id number]   PCP: No PCP Per Patient DOB: Jan 05, 1951   DOA: 07/21/2015   DOS: 07/24/2015   Date of Service: the patient was seen and examined on 07/24/2015  Subjective: No acute complaint reported by the patient other than complaining of fatigue. Also complains of shortness of breath which is chronic. No chest pain and nausea and vomiting Nutrition: Tolerating oral diet  Brief hospital course: Patient was admitted on 07/21/2015, with complaint of chest pain, was found to have COPD exacerbation. His sputum culture growing gram-negative rods. Currently further plan is continue treating for bronchitis with antibiotics and nebulizers.  Assessment and Plan: 1. Chest pain Pleuritic in nature likely noncardiac. Negative troponin. CT chest negative for pulmonary embolism. CT chest also negative for pneumonia. Echocardiogram shows normal ejection fraction and no wall motion abnormality. Continue to monitor on telemetry. Avoid IV Narcotic medications.  2. COPD exacerbation with bronchitis. Bilateral expiratory wheezing present.  Influenza PCR negative. Continue following blood cultures, sputum culture. Continue antibiotics,  Transition to oral prednisone Continue duo nebs. Check oxygenation with ambulation.  3. Active smoker. Nicotine patch. Smoking cessation counseling was done.  4.difficulty walking. PTOT consult recommends no further follow-up. Patient would benefit from outpatient pulmonary rehabilitation  Activity:walking in the room Bowel regimen: last BM 07/23/2015 DVT Prophylaxis: subcutaneous Heparin Nutrition: Cardiac diet Advance goals of care discussion: Full code  Procedures: echocardiogram Consultants: None  Antibiotics: Anti-infectives    Start     Dose/Rate Route Frequency Ordered Stop   07/24/15 2200  azithromycin (ZITHROMAX) tablet 250 mg     250 mg Oral Daily at bedtime 07/24/15 1443 07/28/15 2159   07/22/15 0000  cefTRIAXone (ROCEPHIN) 1 g in dextrose 5 % 50 mL IVPB     1 g 100 mL/hr over 30 Minutes Intravenous Every 24 hours 07/21/15 2346 07/28/15 2359   07/22/15 0000  azithromycin (ZITHROMAX) 500 mg in dextrose 5 % 250 mL IVPB  Status:  Discontinued     500 mg 250 mL/hr over 60 Minutes Intravenous Every 24 hours 07/21/15 2346 07/24/15 1443       Family Communication: no family was present at bedside, at the time of interview.   Disposition:  Expected discharge date: 07/25/2015 Barriers to safe discharge: Improvement in oxygenation   Intake/Output Summary (Last 24 hours) at 07/24/15 1805 Last data filed at 07/24/15 0940  Gross per 24 hour  Intake   1060 ml  Output    600 ml  Net    460 ml   Filed Weights   07/22/15 0150 07/23/15 0525 07/24/15 0530  Weight: 66 kg (145 lb 8.1 oz) 66 kg (145 lb 8.1 oz) 66 kg (145 lb 8.1 oz)    Objective: Physical Exam: Filed Vitals:   07/24/15 0519 07/24/15 0530 07/24/15 0710 07/24/15 1430  BP: 155/83  150/85 146/88  Pulse: 89   88  Temp: 97.5 F (36.4 C)   98 F (36.7 C)  TempSrc: Oral   Oral  Resp: 23  17 18   Height:      Weight:  66 kg (145 lb 8.1 oz)    SpO2: 99%   99%     General: Appear in mild distress, no Rash; Oral Mucosa moist. Cardiovascular: S1 and S2 Present, no Murmur, no JVD Respiratory: Bilateral Air entry present and Clear to Auscultation, no Crackles, no wheezes Abdomen: Bowel Sound present, Soft and no tenderness Extremities: no Pedal edema, no calf tenderness Neurology: Grossly no focal  neuro deficit.  Data Reviewed: CBC:  Recent Labs Lab 07/21/15 2138 07/22/15 0507 07/23/15 0415 07/24/15 0554  WBC 10.7* 8.9 12.3* 11.6*  NEUTROABS  --   --  11.7* 9.0*  HGB 10.4* 10.3* 10.3* 9.9*  HCT 34.1* 33.4* 31.9* 30.8*  MCV 88.8 88.4 88.4 88.3  PLT 216 214 237 0000000   Basic Metabolic Panel:  Recent Labs Lab 07/21/15 2138 07/22/15 0507 07/23/15 0415 07/24/15 0554  NA 136 138 139 138  K 3.5 4.4 4.2  3.3*  CL 98* 100* 101 99*  CO2 26 27 29 29   GLUCOSE 107* 135* 128* 113*  BUN 15 11 16 18   CREATININE 0.82 0.71 0.82 1.01  CALCIUM 8.6* 8.2* 8.6* 8.6*  MG  --   --  2.0 2.1   Liver Function Tests:  Recent Labs Lab 07/21/15 2138 07/23/15 0415 07/24/15 0554  AST 20 15 16   ALT 31 25 25   ALKPHOS 64 63 53  BILITOT 0.5 0.4 0.5  PROT 5.8* 6.0* 5.7*  ALBUMIN 3.3* 3.2* 3.0*    Recent Labs Lab 07/21/15 2138  LIPASE 27   No results for input(s): AMMONIA in the last 168 hours.  Cardiac Enzymes:  Recent Labs Lab 07/21/15 2138 07/22/15 0207 07/22/15 0507 07/22/15 0735  TROPONINI 0.04* 0.03 0.03 0.03    BNP (last 3 results)  Recent Labs  07/21/15 2138  BNP 54.8    CBG: No results for input(s): GLUCAP in the last 168 hours.  Recent Results (from the past 240 hour(s))  Culture, blood (routine x 2) Call MD if unable to obtain prior to antibiotics being given     Status: None (Preliminary result)   Collection Time: 07/22/15 12:00 AM  Result Value Ref Range Status   Specimen Description BLOOD LEFT ANTECUBITAL  Final   Special Requests BOTTLES DRAWN AEROBIC AND ANAEROBIC 5CC  Final   Culture NO GROWTH 1 DAY  Final   Report Status PENDING  Incomplete  Culture, blood (routine x 2) Call MD if unable to obtain prior to antibiotics being given     Status: None (Preliminary result)   Collection Time: 07/22/15 12:05 AM  Result Value Ref Range Status   Specimen Description BLOOD LEFT ARM  Final   Special Requests BOTTLES DRAWN AEROBIC AND ANAEROBIC 5CC  Final   Culture NO GROWTH 1 DAY  Final   Report Status PENDING  Incomplete  MRSA PCR Screening     Status: None   Collection Time: 07/22/15  1:49 AM  Result Value Ref Range Status   MRSA by PCR NEGATIVE NEGATIVE Final    Comment:        The GeneXpert MRSA Assay (FDA approved for NASAL specimens only), is one component of a comprehensive MRSA colonization surveillance program. It is not intended to diagnose  MRSA infection nor to guide or monitor treatment for MRSA infections.   Culture, sputum-assessment     Status: None   Collection Time: 07/22/15  8:23 PM  Result Value Ref Range Status   Specimen Description SPUTUM  Final   Special Requests NONE  Final   Sputum evaluation   Final    THIS SPECIMEN IS ACCEPTABLE. RESPIRATORY CULTURE REPORT TO FOLLOW.   Report Status 07/22/2015 FINAL  Final  Culture, respiratory (NON-Expectorated)     Status: None (Preliminary result)   Collection Time: 07/22/15 11:20 PM  Result Value Ref Range Status   Specimen Description EXPECTORATED SPUTUM  Final   Special Requests NONE  Final   Gram  Stain   Final    ABUNDANT WBC PRESENT, PREDOMINANTLY PMN RARE SQUAMOUS EPITHELIAL CELLS PRESENT MODERATE GRAM NEGATIVE RODS MODERATE GRAM POSITIVE RODS RARE GRAM POSITIVE COCCI IN PAIRS THIS SPECIMEN IS ACCEPTABLE FOR SPUTUM CULTURE Performed at [facility]    Culture   Final    MODERATE GRAM NEGATIVE RODS Performed at [facility]    Report Status PENDING  Incomplete     Studies: No results found.   Scheduled Meds: . azithromycin  250 mg Oral QHS  . busPIRone  15 mg Oral BID  . cefTRIAXone (ROCEPHIN)  IV  1 g Intravenous Q24H  . docusate sodium  100 mg Oral BID  . ferrous sulfate  325 mg Oral Q breakfast  . fluticasone  1 spray Each Nare Daily  . gabapentin  300 mg Oral TID  . guaiFENesin  600 mg Oral BID  . hydrOXYzine  50 mg Oral TID  . pantoprazole  40 mg Oral Daily  . PARoxetine  20 mg Oral Daily  . predniSONE  50 mg Oral Q breakfast  . sodium chloride flush  3 mL Intravenous Q12H  . tiotropium  18 mcg Inhalation Daily   Continuous Infusions:  PRN Meds: acetaminophen **OR** acetaminophen, levalbuterol, methocarbamol, ondansetron **OR** ondansetron (ZOFRAN) IV, senna-docusate  Time spent: 30 minutes  Author: Berle Mull, MD [facility] Pager: [phone] 07/24/2015 6:05 PM  If 7PM-7AM, please contact  night-coverage at [email or web address], password [facility]

## 2015-07-24 NOTE — Progress Notes (Signed)
RN supervising patient transferring to bedside commode.  Patient able to complete task per self after RN set-up bedside commode beside bed.  When patient on bedside commode patient stated he just couldn't imagine himself going home today, someone had mentioned to patient that he might be discharged today and patient stated he felt like he was just too sick.  RN provided encouragement and discussed with patient talking to his doctor(s) that come and see him today to express his concerns.  Patient stated understanding.

## 2015-07-24 NOTE — Evaluation (Signed)
Physical Therapy Evaluation Patient Details Name: Dustin Price MRN: [id number] DOB: 09-Jan-1951 Today's Date: 07/24/2015   History of Present Illness  Pt adm with chest pain and COPD exacerbation. PMH - anxiety, COPD, CVA, HTN  Clinical Impression  Pt admitted with above diagnosis and presents to PT with functional limitations due to deficits listed below (See PT problem list). Pt needs skilled PT to maximize independence and safety to allow discharge to home. Pt's primary limitation is he limited activity tolerance due to his COPD and his anxiety. Will follow while he is here in the hospital to work on activity tolerance. Could benefit from pulmonary rehab after dc but unsure if he has transportation to get to one.     Follow Up Recommendations No PT follow up;Other (comment) (Outpatient pulmonary rehab program)    Equipment Recommendations  None recommended by PT    Recommendations for Other Services       Precautions / Restrictions Precautions Precautions: None      Mobility  Bed Mobility Overal bed mobility: Modified Independent                Transfers Overall transfer level: Modified independent                  Ambulation/Gait Ambulation/Gait assistance: Modified independent (Device/Increase time) Ambulation Distance (Feet): 15 Feet Assistive device: None Gait Pattern/deviations: Step-through pattern;Decreased stride length Gait velocity: decr Gait velocity interpretation: Below normal speed for age/gender General Gait Details: No assist needed except to manage lines/tubes. Pt on 3L of O2 with SpO2 >95% with amb but dyspnea 3/4  Stairs            Wheelchair Mobility    Modified Rankin (Stroke Patients Only)       Balance Overall balance assessment: Modified Independent                                           Pertinent Vitals/Pain      Home Living Family/patient expects to be discharged to:: Private  residence Living Arrangements: Other relatives Available Help at Discharge: Family Type of Home: House Home Access: Stairs to enter   Technical brewer of Steps: 3 Home Layout: One level Home Equipment: Other (comment) (O2)      Prior Function Level of Independence: Independent with assistive device(s)         Comments: On home O2 and with limited endurance     Hand Dominance        Extremity/Trunk Assessment   Upper Extremity Assessment: Overall WFL for tasks assessed           Lower Extremity Assessment: Overall WFL for tasks assessed         Communication   Communication: No difficulties  Cognition Arousal/Alertness: Awake/alert Behavior During Therapy: Anxious Overall Cognitive Status: Within Functional Limits for tasks assessed                      General Comments      Exercises        Assessment/Plan    PT Assessment Patient needs continued PT services  PT Diagnosis Difficulty walking   PT Problem List Decreased activity tolerance;Cardiopulmonary status limiting activity  PT Treatment Interventions Gait training;Functional mobility training;Patient/family education   PT Goals (Current goals can be found in the Care Plan section) Acute Rehab PT Goals PT Goal Formulation:  With patient Time For Goal Achievement: 07/31/15 Potential to Achieve Goals: Good    Frequency Min 3X/week   Barriers to discharge        Co-evaluation               End of Session Equipment Utilized During Treatment: Oxygen Activity Tolerance: Other (comment) (Limited by pt anxiety) Patient left: in bed;with call bell/phone within reach Nurse Communication: Mobility status         Time: 1210-1225 PT Time Calculation (min) (ACUTE ONLY): 15 min   Charges:   PT Evaluation $PT Eval Moderate Complexity: 1 Procedure     PT G Codes:        Greta Yung 2015-08-03, 1:31 PM  [facility] PT [phone]

## 2015-07-24 NOTE — Progress Notes (Signed)
Pt has refused to walk with nursing staff today. Pt is on home 02

## 2015-07-24 NOTE — Plan of Care (Signed)
Problem: Education: Goal: Knowledge of Tool General Education information/materials will improve Outcome: Progressing Patient aware of plan of care throughout this shift.  RN provided medication education to patient on all medications administered thus far this shift.

## 2015-07-25 LAB — COMPREHENSIVE METABOLIC PANEL
ALK PHOS: 61 U/L (ref 38–126)
ALT: 28 U/L (ref 17–63)
ANION GAP: 12 (ref 5–15)
AST: 19 U/L (ref 15–41)
Albumin: 3.1 g/dL — ABNORMAL LOW (ref 3.5–5.0)
BILIRUBIN TOTAL: 0.5 mg/dL (ref 0.3–1.2)
BUN: 18 mg/dL (ref 6–20)
CALCIUM: 8.4 mg/dL — AB (ref 8.9–10.3)
CO2: 27 mmol/L (ref 22–32)
CREATININE: 0.81 mg/dL (ref 0.61–1.24)
Chloride: 99 mmol/L — ABNORMAL LOW (ref 101–111)
Glucose, Bld: 87 mg/dL (ref 65–99)
Potassium: 3.8 mmol/L (ref 3.5–5.1)
SODIUM: 138 mmol/L (ref 135–145)
TOTAL PROTEIN: 5.6 g/dL — AB (ref 6.5–8.1)

## 2015-07-25 LAB — CULTURE, RESPIRATORY

## 2015-07-25 LAB — CBC WITH DIFFERENTIAL/PLATELET
BASOS PCT: 0 %
Basophils Absolute: 0 10*3/uL (ref 0.0–0.1)
EOS PCT: 0 %
Eosinophils Absolute: 0 10*3/uL (ref 0.0–0.7)
HEMATOCRIT: 31.4 % — AB (ref 39.0–52.0)
HEMOGLOBIN: 10.1 g/dL — AB (ref 13.0–17.0)
LYMPHS PCT: 17 %
Lymphs Abs: 1.9 10*3/uL (ref 0.7–4.0)
MCH: 28.5 pg (ref 26.0–34.0)
MCHC: 32.2 g/dL (ref 30.0–36.0)
MCV: 88.7 fL (ref 78.0–100.0)
MONOS PCT: 9 %
Monocytes Absolute: 1 10*3/uL (ref 0.1–1.0)
NEUTROS PCT: 74 %
Neutro Abs: 8.3 10*3/uL — ABNORMAL HIGH (ref 1.7–7.7)
Platelets: 246 10*3/uL (ref 150–400)
RBC: 3.54 MIL/uL — AB (ref 4.22–5.81)
RDW: 14.8 % (ref 11.5–15.5)
WBC: 11.2 10*3/uL — AB (ref 4.0–10.5)

## 2015-07-25 LAB — MAGNESIUM: Magnesium: 2.2 mg/dL (ref 1.7–2.4)

## 2015-07-25 LAB — CULTURE, RESPIRATORY W GRAM STAIN

## 2015-07-25 MED ORDER — GUAIFENESIN ER 600 MG PO TB12: 600.0000 mg | ORAL_TABLET | Freq: Two times a day (BID) | ORAL | Status: AC

## 2015-07-25 MED ORDER — LEVOFLOXACIN 750 MG PO TABS
750.0000 mg | ORAL_TABLET | Freq: Every day | ORAL | Status: DC
  Administered 2015-07-25: 750 mg via ORAL
  Filled 2015-07-25: qty 1

## 2015-07-25 MED ORDER — LEVOFLOXACIN 750 MG PO TABS: 750.0000 mg | ORAL_TABLET | Freq: Every day | ORAL | Status: DC

## 2015-07-25 MED ORDER — PREDNISONE 10 MG PO TABS: ORAL_TABLET | ORAL | Status: DC

## 2015-07-25 NOTE — Discharge Instructions (Signed)
Chronic Obstructive Pulmonary Disease Chronic obstructive pulmonary disease (COPD) is a common lung condition in which airflow from the lungs is limited. COPD is a general term that can be used to describe many different lung problems that limit airflow, including both chronic bronchitis and emphysema. If you have COPD, your lung function will probably never return to normal, but there are measures you can take to improve lung function and make yourself feel better. CAUSES   Smoking (common).  Exposure to secondhand smoke.  Genetic problems.  Chronic inflammatory lung diseases or recurrent infections. SYMPTOMS  Shortness of breath, especially with physical activity.  Deep, persistent (chronic) cough with a large amount of thick mucus.  Wheezing.  Rapid breaths (tachypnea).  Gray or bluish discoloration (cyanosis) of the skin, especially in your fingers, toes, or lips.  Fatigue.  Weight loss.  Frequent infections or episodes when breathing symptoms become much worse (exacerbations).  Chest tightness. DIAGNOSIS Your health care provider will take a medical history and perform a physical examination to diagnose COPD. Additional tests for COPD may include:  Lung (pulmonary) function tests.  Chest X-ray.  CT scan.  Blood tests. TREATMENT  Treatment for COPD may include:  Inhaler and nebulizer medicines. These help manage the symptoms of COPD and make your breathing more comfortable.  Supplemental oxygen. Supplemental oxygen is only helpful if you have a low oxygen level in your blood.  Exercise and physical activity. These are beneficial for nearly all people with COPD.  Lung surgery or transplant.  Nutrition therapy to gain weight, if you are underweight.  Pulmonary rehabilitation. This may involve working with a team of health care providers and specialists, such as respiratory, occupational, and physical therapists. HOME CARE INSTRUCTIONS  Take all medicines  (inhaled or pills) as directed by your health care provider.  Avoid over-the-counter medicines or cough syrups that dry up your airway (such as antihistamines) and slow down the elimination of secretions unless instructed otherwise by your health care provider.  If you are a smoker, the most important thing that you can do is stop smoking. Continuing to smoke will cause further lung damage and breathing trouble. Ask your health care provider for help with quitting smoking. He or she can direct you to community resources or hospitals that provide support.  Avoid exposure to irritants such as smoke, chemicals, and fumes that aggravate your breathing.  Use oxygen therapy and pulmonary rehabilitation if directed by your health care provider. If you require home oxygen therapy, ask your health care provider whether you should purchase a pulse oximeter to measure your oxygen level at home.  Avoid contact with individuals who have a contagious illness.  Avoid extreme temperature and humidity changes.  Eat healthy foods. Eating smaller, more frequent meals and resting before meals may help you maintain your strength.  Stay active, but balance activity with periods of rest. Exercise and physical activity will help you maintain your ability to do things you want to do.  Preventing infection and hospitalization is very important when you have COPD. Make sure to receive all the vaccines your health care provider recommends, especially the pneumococcal and influenza vaccines. Ask your health care provider whether you need a pneumonia vaccine.  Learn and use relaxation techniques to manage stress.  Learn and use controlled breathing techniques as directed by your health care provider. Controlled breathing techniques include:  Pursed lip breathing. Start by breathing in (inhaling) through your nose for 1 second. Then, purse your lips as if you were   going to whistle and breathe out (exhale) through the  pursed lips for 2 seconds.  Diaphragmatic breathing. Start by putting one hand on your abdomen just above your waist. Inhale slowly through your nose. The hand on your abdomen should move out. Then purse your lips and exhale slowly. You should be able to feel the hand on your abdomen moving in as you exhale.  Learn and use controlled coughing to clear mucus from your lungs. Controlled coughing is a series of short, progressive coughs. The steps of controlled coughing are: 1. Lean your head slightly forward. 2. Breathe in deeply using diaphragmatic breathing. 3. Try to hold your breath for 3 seconds. 4. Keep your mouth slightly open while coughing twice. 5. Spit any mucus out into a tissue. 6. Rest and repeat the steps once or twice as needed. SEEK MEDICAL CARE IF:  You are coughing up more mucus than usual.  There is a change in the color or thickness of your mucus.  Your breathing is more labored than usual.  Your breathing is faster than usual. SEEK IMMEDIATE MEDICAL CARE IF:  You have shortness of breath while you are resting.  You have shortness of breath that prevents you from:  Being able to talk.  Performing your usual physical activities.  You have chest pain lasting longer than 5 minutes.  Your skin color is more cyanotic than usual.  You measure low oxygen saturations for longer than 5 minutes with a pulse oximeter. MAKE SURE YOU:  Understand these instructions.  Will watch your condition.  Will get help right away if you are not doing well or get worse.   This information is not intended to replace advice given to you by your health care provider. Make sure you discuss any questions you have with your health care provider.   Document Released: 01/03/2005 Document Revised: 04/16/2014 Document Reviewed: 11/20/2012 Elsevier Interactive Patient Education 2016 [employer]  

## 2015-07-25 NOTE — Care Management Important Message (Signed)
Important Message  Patient Details  Name: ANFERNEE OLA MRN: [id number] Date of Birth: Feb 08, 1951   Medicare Important Message Given:  Yes    Nahal Wanless P [place] 07/25/2015, 1:53 PM

## 2015-07-25 NOTE — Progress Notes (Signed)
Pt discharged home. Discharge instructions have been gone over with the patient. IV's removed. Pt given unit number and told to call if they have any concerns regarding their discharge instructions. Manolo Bosket V, RN   

## 2015-07-25 NOTE — Progress Notes (Signed)
Patient will DC to: Patient's home Anticipated DC date: 07/25/15 Family notified: N/A Transport by: [facility]  CSW signing off.  Cedric Fishman, [place] Social Worker [phone]

## 2015-07-25 NOTE — Progress Notes (Signed)
Physical Therapy Treatment Patient Details Name: Dustin Price MRN: [id number] DOB: 1950-04-13 Today's Date: August 02, 2015    History of Present Illness Pt adm with chest pain and COPD exacerbation. PMH - anxiety, COPD, CVA, HTN    PT Comments    Pt performed increased distance remains limited secondary to anxiety.  Pt educated to ambulate with staff later this day.  Pt agreeable to sit up in chair for lunch.    Follow Up Recommendations  No PT follow up;Other (comment) (out patient pulmonary rehab. )     Equipment Recommendations  None recommended by PT    Recommendations for Other Services       Precautions / Restrictions Precautions Precautions: None Restrictions Weight Bearing Restrictions: No    Mobility  Bed Mobility Overal bed mobility: Modified Independent                Transfers Overall transfer level: Modified independent Equipment used: None                Ambulation/Gait Ambulation/Gait assistance: Modified independent (Device/Increase time) Ambulation Distance (Feet): 50 Feet Assistive device: Rolling walker (2 wheeled) Gait Pattern/deviations: Step-through pattern;Decreased stride length Gait velocity: decr   General Gait Details: No assist needed except to manage lines/tubes. Pt on 3L of O2 with SpO2 >95% with amb but dyspnea 3/4   Stairs            Wheelchair Mobility    Modified Rankin (Stroke Patients Only)       Balance Overall balance assessment: Modified Independent                                  Cognition Arousal/Alertness: Awake/alert Behavior During Therapy: Anxious Overall Cognitive Status: Within Functional Limits for tasks assessed                      Exercises      General Comments        Pertinent Vitals/Pain Pain Assessment: No/denies pain    Home Living                      Prior Function            PT Goals (current goals can now be found in the care  plan section) Acute Rehab PT Goals PT Goal Formulation: With patient Potential to Achieve Goals: Good Progress towards PT goals: Progressing toward goals    Frequency  Min 3X/week    PT Plan      Co-evaluation             End of Session Equipment Utilized During Treatment: Oxygen Activity Tolerance: Other (comment) (remains limited by pt anxiety) Patient left: in bed;with call bell/phone within reach     Time: 1141-1159 PT Time Calculation (min) (ACUTE ONLY): 18 min  Charges:  $Gait Training: 8-22 mins                    G Codes:      Cristela Blue Aug 02, 2015, 12:57 PM  Governor Rooks, PTA pager [phone]

## 2015-07-26 ENCOUNTER — Encounter (HOSPITAL_COMMUNITY): Payer: Self-pay | Admitting: Emergency Medicine

## 2015-07-26 ENCOUNTER — Emergency Department (HOSPITAL_COMMUNITY)
Admission: EM | Admit: 2015-07-26 | Discharge: 2015-07-26 | Disposition: A | Discharge status: Home / Self Care | Payer: Medicare Other | Source: Home / Self Care | Attending: Emergency Medicine | Admitting: Emergency Medicine

## 2015-07-26 ENCOUNTER — Emergency Department (HOSPITAL_COMMUNITY): Payer: Medicare Other

## 2015-07-26 DIAGNOSIS — J449 Chronic obstructive pulmonary disease, unspecified: Secondary | ICD-10-CM

## 2015-07-26 DIAGNOSIS — F419 Anxiety disorder, unspecified: Secondary | ICD-10-CM | POA: Insufficient documentation

## 2015-07-26 DIAGNOSIS — Z87891 Personal history of nicotine dependence: Secondary | ICD-10-CM

## 2015-07-26 DIAGNOSIS — J441 Chronic obstructive pulmonary disease with (acute) exacerbation: Secondary | ICD-10-CM

## 2015-07-26 DIAGNOSIS — Z79899 Other long term (current) drug therapy: Secondary | ICD-10-CM

## 2015-07-26 DIAGNOSIS — Z8673 Personal history of transient ischemic attack (TIA), and cerebral infarction without residual deficits: Secondary | ICD-10-CM | POA: Insufficient documentation

## 2015-07-26 DIAGNOSIS — I10 Essential (primary) hypertension: Secondary | ICD-10-CM

## 2015-07-26 DIAGNOSIS — Z7951 Long term (current) use of inhaled steroids: Secondary | ICD-10-CM | POA: Insufficient documentation

## 2015-07-26 DIAGNOSIS — R0602 Shortness of breath: Secondary | ICD-10-CM | POA: Diagnosis not present

## 2015-07-26 DIAGNOSIS — R079 Chest pain, unspecified: Secondary | ICD-10-CM | POA: Diagnosis not present

## 2015-07-26 LAB — CBC WITH DIFFERENTIAL/PLATELET
BASOS ABS: 0 10*3/uL (ref 0.0–0.1)
Basophils Relative: 0 %
Eosinophils Absolute: 0 10*3/uL (ref 0.0–0.7)
Eosinophils Relative: 0 %
HEMATOCRIT: 32.4 % — AB (ref 39.0–52.0)
HEMOGLOBIN: 9.8 g/dL — AB (ref 13.0–17.0)
LYMPHS PCT: 14 %
Lymphs Abs: 1.6 10*3/uL (ref 0.7–4.0)
MCH: 26.9 pg (ref 26.0–34.0)
MCHC: 30.2 g/dL (ref 30.0–36.0)
MCV: 89 fL (ref 78.0–100.0)
MONO ABS: 1 10*3/uL (ref 0.1–1.0)
MONOS PCT: 8 %
NEUTROS ABS: 9 10*3/uL — AB (ref 1.7–7.7)
Neutrophils Relative %: 78 %
Platelets: 270 10*3/uL (ref 150–400)
RBC: 3.64 MIL/uL — ABNORMAL LOW (ref 4.22–5.81)
RDW: 14.8 % (ref 11.5–15.5)
WBC: 11.6 10*3/uL — ABNORMAL HIGH (ref 4.0–10.5)

## 2015-07-26 LAB — COMPREHENSIVE METABOLIC PANEL
ALK PHOS: 54 U/L (ref 38–126)
ALT: 28 U/L (ref 17–63)
AST: 18 U/L (ref 15–41)
Albumin: 3.3 g/dL — ABNORMAL LOW (ref 3.5–5.0)
Anion gap: 10 (ref 5–15)
BILIRUBIN TOTAL: 0.4 mg/dL (ref 0.3–1.2)
BUN: 15 mg/dL (ref 6–20)
CALCIUM: 8.7 mg/dL — AB (ref 8.9–10.3)
CO2: 28 mmol/L (ref 22–32)
Chloride: 99 mmol/L — ABNORMAL LOW (ref 101–111)
Creatinine, Ser: 0.97 mg/dL (ref 0.61–1.24)
GFR calc Af Amer: >60 mL/min (ref >60)
Glucose, Bld: 106 mg/dL — ABNORMAL HIGH (ref 65–99)
POTASSIUM: 3.4 mmol/L — AB (ref 3.5–5.1)
Sodium: 137 mmol/L (ref 135–145)
Total Protein: 5.8 g/dL — ABNORMAL LOW (ref 6.5–8.1)

## 2015-07-26 LAB — I-STAT TROPONIN, ED: TROPONIN I, POC: 0 ng/mL (ref 0.00–0.08)

## 2015-07-26 LAB — BRAIN NATRIURETIC PEPTIDE: B NATRIURETIC PEPTIDE 5: 36.5 pg/mL (ref 0.0–100.0)

## 2015-07-26 MED ORDER — IPRATROPIUM-ALBUTEROL 0.5-2.5 (3) MG/3ML IN SOLN
3.0000 mL | RESPIRATORY_TRACT | Status: AC
  Administered 2015-07-26 (×3): 3 mL via RESPIRATORY_TRACT
  Filled 2015-07-26 (×2): qty 9

## 2015-07-26 NOTE — Discharge Instructions (Signed)
Your evaluation today is normal. You should follow-up with your primary care doctor for similar problems, and as needed. Continue taking your usual medications.   Chronic Obstructive Pulmonary Disease Exacerbation Chronic obstructive pulmonary disease (COPD) is a common lung condition in which airflow from the lungs is limited. COPD is a general term that can be used to describe many different lung problems that limit airflow, including chronic bronchitis and emphysema. COPD exacerbations are episodes when breathing symptoms become much worse and require extra treatment. Without treatment, COPD exacerbations can be life threatening, and frequent COPD exacerbations can cause further damage to your lungs. CAUSES  Respiratory infections.  Exposure to smoke.  Exposure to air pollution, chemical fumes, or dust. Sometimes there is no apparent cause or trigger. RISK FACTORS  Smoking cigarettes.  Older age.  Frequent prior COPD exacerbations. SIGNS AND SYMPTOMS  Increased coughing.  Increased thick spit (sputum) production.  Increased wheezing.  Increased shortness of breath.  Rapid breathing.  Chest tightness. DIAGNOSIS Your medical history, a physical exam, and tests will help your health care provider make a diagnosis. Tests may include:  A chest X-ray.  Basic lab tests.  Sputum testing.  An arterial blood gas test. TREATMENT Depending on the severity of your COPD exacerbation, you may need to be admitted to a hospital for treatment. Some of the treatments commonly used to treat COPD exacerbations are:   Antibiotic medicines.  Bronchodilators. These are drugs that expand the air passages. They may be given with an inhaler or nebulizer. Spacer devices may be needed to help improve drug delivery.  Corticosteroid medicines.  Supplemental oxygen therapy.  Airway clearing techniques, such as noninvasive ventilation (NIV) and positive expiratory pressure (PEP). These  provide respiratory support through a mask or other noninvasive device. HOME CARE INSTRUCTIONS  Do not smoke. Quitting smoking is very important to prevent COPD from getting worse and exacerbations from happening as often.  Avoid exposure to all substances that irritate the airway, especially to tobacco smoke.  If you were prescribed an antibiotic medicine, finish it all even if you start to feel better.  Take all medicines as directed by your health care provider.It is important to use correct technique with inhaled medicines.  Drink enough fluids to keep your urine clear or pale yellow (unless you have a medical condition that requires fluid restriction).  Use a cool mist vaporizer. This makes it easier to clear your chest when you cough.  If you have a home nebulizer and oxygen, continue to use them as directed.  Maintain all necessary vaccinations to prevent infections.  Exercise regularly.  Eat a healthy diet.  Keep all follow-up appointments as directed by your health care provider. SEEK IMMEDIATE MEDICAL CARE IF:  You have worsening shortness of breath.  You have trouble talking.  You have severe chest pain.  You have blood in your sputum.  You have a fever.  You have weakness, vomit repeatedly, or faint.  You feel confused.  You continue to get worse. MAKE SURE YOU:  Understand these instructions.  Will watch your condition.  Will get help right away if you are not doing well or get worse.   This information is not intended to replace advice given to you by your health care provider. Make sure you discuss any questions you have with your health care provider.   Document Released: 01/21/2007 Document Revised: 04/16/2014 Document Reviewed: 11/28/2012 Elsevier Interactive Patient Education [employer].

## 2015-07-26 NOTE — ED Notes (Signed)
Pt reports he feels more short of breath after neb treatment than before. Pt does not appear to be in any distress. VSS.

## 2015-07-26 NOTE — Discharge Summary (Signed)
[facility] Discharge Summary   Patient: Dustin Price [id number]   PCP: No PCP Per Patient DOB: [birth year]   Date of admission: 07/21/2015   Date of discharge: 07/25/2015    Discharge Diagnoses:  Principal Problem:   Chest pain Active Problems:   Hypertension   Anxiety   COPD exacerbation ([place])  Recommendations for Outpatient Follow-up:  1. Follow-up with PCP in one week   Diet recommendation: Cardiac diet  Activity: The patient is advised to gradually reintroduce usual activities.  Discharge Condition: good  History of present illness: As per the H and P dictated on admission, "Dustin Price is a 65 y.o. male with a past medical history significant for COPD, hx of CVA, and anxiety who presents with chest pain.  The patient presents with severe, constant, substernal and left-sided, stabbing chest pain associated with shortness of breath, and sweats. History is limited by the patient's poor history, at times stating that the pain has been present for the past two days constantly, at other times stating that he was admitted to [facility] for this same pain for four days in the past week (and that they told him "to be very careful about it" but did not tell him what it was).   The pain was severe and unabating tonight, so he called EMS from [facility] where he lives and asked them to bring him to [facility].  In the ED, the patient had a low-grade temperature 99.3 F, tachycardia, tachypnea and elevated blood pressure, and mild hypoxia. Na 136, K 3.5, Cr 0.8, WBC 10.7K, Hgb 10.4, troponin serum 0.04, BNP normal, lipase and lactate normal. ECG showed incomplete RBBB, with subtle/equivocal ST elevations wtihout reciprocal changes. CXR showed hyperinflation without Radiologist identified opacity or mediastinal abnormality.  He was given solumedrol and albuterol for presumed COPD flare and TR[facility] were asked to evaluate for admission. Of note, he also reports "trembling" which  appears to be rigors, as well as rattling cough and "like my phlegm tastes green". He is able to walk without a cane or walker at baseline, but is so debilitated at baseline that he can't exert more than to walk from one room to the next, and can't do housework or light physical activity, so it's hard to assess if his pain is exertional."  Hospital Course:  Summary of his active problems in the hospital is as following. 1. Chest pain Pleuritic in nature likely noncardiac. Negative troponin. CT chest negative for pulmonary embolism. CT chest also negative for pneumonia. Echocardiogram shows normal ejection fraction and no wall motion abnormality. No further cardiac workup is needed as pain associated with COPD and bronchitis  2. COPD exacerbation with bronchitis. Bilateral expiratory wheezing present.  Influenza PCR negative. Blood cultures remain negative, sputum culture grew sputum on this. Patient's antibiotic changed to Levaquin on discharge patient will finish seven-day treatment. Continue duo nebs continue home inhaler continue prednisone on discharge with taper.  3. Active smoker. Nicotine patch. Smoking cessation counseling was done.  4.difficulty walking. PTOT consult recommends no further follow-up. Patient would benefit from outpatient pulmonary rehabilitation  All other chronic medical condition were stable during the hospitalization.  Patient was seen by physical therapy, who recommended no further therapy requirement but patient may need pulmonary rehabilitation as an outpatient. Patient will need to follow-up with PCP for scheduling this On the day of the discharge the patient's oxygenation was stable, and no other acute medical condition were reported by patient. the patient was felt safe to  be discharge at home with family.  Procedures and Results:  Echocardiogram Study Conclusions  - Left ventricle: The cavity size was normal. Systolic function was  normal. The  estimated ejection fraction was in the range of 60%  to 65%. Wall motion was normal; there were no regional wall  motion abnormalities. Left ventricular diastolic function  parameters were normal. Acoustic contrast opacification revealed  no evidence ofthrombus   Consultations:  none  DISCHARGE MEDICATION: Discharge Medication List as of 07/25/2015 12:53 PM    START taking these medications   Details  guaiFENesin (MUCINEX) 600 MG 12 hr tablet Take 1 tablet (600 mg total) by mouth 2 (two) times daily., Starting 07/25/2015, Until Discontinued, Normal    levofloxacin (LEVAQUIN) 750 MG tablet Take 1 tablet (750 mg total) by mouth daily., Starting 07/25/2015, Until Mon 08/01/15, Normal    predniSONE (DELTASONE) 10 MG tablet Take 50mg  daily for 3days,Take 40mg  daily for 3days,Take 30mg  daily for 3days,Take 20mg  daily for 3days,Take 10mg  daily for 3days, then stop, Normal      CONTINUE these medications which have NOT CHANGED   Details  albuterol (PROVENTIL HFA;VENTOLIN HFA) 108 (90 Base) MCG/ACT inhaler Inhale 2 puffs into the lungs every 6 (six) hours as needed for wheezing or shortness of breath., Until Discontinued, Historical Med    albuterol (PROVENTIL) (2.5 MG/3ML) 0.083% nebulizer solution Take 2.5 mg by nebulization every 6 (six) hours as needed for wheezing or shortness of breath., Until Discontinued, Historical Med    busPIRone (BUSPAR) 15 MG tablet Take 15 mg by mouth 2 (two) times daily. Reported on 07/21/2015, Until Discontinued, Historical Med    docusate sodium (COLACE) 100 MG capsule Take 100 mg by mouth 2 (two) times daily., Until Discontinued, Historical Med    ferrous sulfate 325 (65 FE) MG tablet Take 325 mg by mouth daily with breakfast., Until Discontinued, Historical Med    fluticasone (FLONASE) 50 MCG/ACT nasal spray Place 1 spray into both nostrils daily., Until Discontinued, Historical Med    gabapentin (NEURONTIN) 300 MG capsule Take 300 mg by mouth 3 (three)  times daily., Until Discontinued, Historical Med    hydrOXYzine (ATARAX/VISTARIL) 50 MG tablet Take 50 mg by mouth 3 (three) times daily., Until Discontinued, Historical Med    omeprazole (PRILOSEC) 40 MG capsule Take 40 mg by mouth daily., Until Discontinued, Historical Med    PARoxetine (PAXIL) 20 MG tablet Take 20 mg by mouth daily., Until Discontinued, Historical Med    tiotropium (SPIRIVA) 18 MCG inhalation capsule Place 18 mcg into inhaler and inhale daily., Until Discontinued, Historical Med    varenicline (CHANTIX) 1 MG tablet Take 1 mg by mouth daily., Until Discontinued, Historical Med       No Known Allergies Discharge Instructions    Diet - low sodium heart healthy    Complete by:  As directed      Discharge instructions    Complete by:  As directed   It is important that you read following instructions as well as go over your medication list with RN to help you understand your care after this hospitalization.  Discharge Instructions: Please follow-up with PCP in one week, get a referral for pulmonary rehab.  Please request your primary care physician to go over all Hospital Tests and Procedure/Radiological results at the follow up,  Please get all Hospital records sent to your PCP by signing hospital release before you go home.   Do not take more than prescribed Pain, Sleep and Anxiety Medications. You  were cared for by a hospitalist during your hospital stay. If you have any questions about your discharge medications or the care you received while you were in the hospital after you are discharged, you can call the unit and ask to speak with the hospitalist on call if the hospitalist that took care of you is not available.  Once you are discharged, your primary care physician will handle any further medical issues. Please note that NO REFILLS for any discharge medications will be authorized once you are discharged, as it is imperative that you return to your primary care  physician (or establish a relationship with a primary care physician if you do not have one) for your aftercare needs so that they can reassess your need for medications and monitor your lab values. You Must read complete instructions/literature along with all the possible adverse reactions/side effects for all the Medicines you take and that have been prescribed to you. Take any new Medicines after you have completely understood and accept all the possible adverse reactions/side effects. Wear Seat belts while driving. If you have smoked or chewed Tobacco in the last 2 yrs please stop smoking and/or stop any Recreational drug use.     Increase activity slowly    Complete by:  As directed           Discharge Exam: Filed Weights   07/22/15 0150 07/23/15 0525 07/24/15 0530  Weight: 66 kg (145 lb 8.1 oz) 66 kg (145 lb 8.1 oz) 66 kg (145 lb 8.1 oz)   Filed Vitals:   07/25/15 0802 07/25/15 1338  BP:  153/79  Pulse: 81 104  Temp:  98.9 F (37.2 C)  Resp: 18 18   General: Appear in no distress, no Rash; Oral Mucosa moist. Cardiovascular: S1 and S2 Present, no Murmur, no JVD Respiratory: Bilateral Air entry present and no Crackles, occasional wheezes Abdomen: Bowel Sound prsent, Soft and no tenderness Extremities: no Pedal edema, no calf tenderness Neurology: Grossly no focal neuro deficit.  The results of significant diagnostics from this hospitalization (including imaging, microbiology, ancillary and laboratory) are listed below for reference.    Significant Diagnostic Studies: Dg Chest 2 View  07/26/2015  CLINICAL DATA:  Shortness of breath for 1 day.  Initial encounter. EXAM: CHEST  2 VIEW COMPARISON:  CT chest 07/22/2015.  PA and lateral chest 07/21/2015. FINDINGS: The lungs are severely emphysematous but clear. Heart size is normal. No pneumothorax or pleural effusion. The patient is status post CABG. IMPRESSION: Emphysema without acute disease. Electronically Signed   By: Inge Rise M.D.   On: 07/26/2015 16:00   Dg Chest 2 View  07/21/2015  CLINICAL DATA:  Chest pain and shortness of breath EXAM: CHEST  2 VIEW COMPARISON:  July 06, 2015 FINDINGS: There is underlying emphysematous change bilaterally, stable. There are stable scattered areas of scarring bilaterally. There is no edema or consolidation. The heart size is within normal limits. The pulmonary vascularity is stable and reflects underlying emphysema. Patient is status post median sternotomy. No adenopathy. No bone lesions. IMPRESSION: Underlying emphysematous change with scattered areas of scarring. No edema or consolidation. Stable cardiac silhouette. Electronically Signed   By: Lowella Grip III M.D.   On: 07/21/2015 22:03   Ct Angio Chest Pe W/cm &/or Wo Cm  07/22/2015  CLINICAL DATA:  Chest pain for 3 days. EXAM: CT ANGIOGRAPHY CHEST WITH CONTRAST TECHNIQUE: Multidetector CT imaging of the chest was performed using the standard protocol during bolus administration of intravenous  contrast. Multiplanar CT image reconstructions and MIPs were obtained to evaluate the vascular anatomy. CONTRAST:  100 mL Isovue 370 intravenous COMPARISON:  Radiographs 07/21/2015, CT 08/07/2014 FINDINGS: Cardiovascular: There is good opacification of the pulmonary arteries. There is no pulmonary embolism. The thoracic aorta is normal in caliber and intact. Lungs: Severe bullous emphysematous disease bilaterally. No confluent consolidation. No masses or suspicious nodules. No pneumothorax. Central airways: Retained secretions or aspiration in the right mainstem bronchus and right lower lobe bronchus. Effusions: None Lymphadenopathy: None Esophagus: Unremarkable Upper abdomen: Right renal cyst, unchanged. Musculoskeletal: No significant abnormality. Review of the MIP images confirms the above findings. IMPRESSION: 1. Negative for acute pulmonary embolism. 2. Severe bullous emphysema. 3. Retained secretions or aspiration in the right  mainstem bronchus and right lower lobe bronchus. Electronically Signed   By: Andreas Newport M.D.   On: 07/22/2015 02:31    Microbiology: Recent Results (from the past 240 hour(s))  Culture, blood (routine x 2) Call MD if unable to obtain prior to antibiotics being given     Status: None (Preliminary result)   Collection Time: 07/22/15 12:00 AM  Result Value Ref Range Status   Specimen Description BLOOD LEFT ANTECUBITAL  Final   Special Requests BOTTLES DRAWN AEROBIC AND ANAEROBIC 5CC  Final   Culture NO GROWTH 4 DAYS  Final   Report Status PENDING  Incomplete  Culture, blood (routine x 2) Call MD if unable to obtain prior to antibiotics being given     Status: None (Preliminary result)   Collection Time: 07/22/15 12:05 AM  Result Value Ref Range Status   Specimen Description BLOOD LEFT ARM  Final   Special Requests BOTTLES DRAWN AEROBIC AND ANAEROBIC 5CC  Final   Culture NO GROWTH 4 DAYS  Final   Report Status PENDING  Incomplete  MRSA PCR Screening     Status: None   Collection Time: 07/22/15  1:49 AM  Result Value Ref Range Status   MRSA by PCR NEGATIVE NEGATIVE Final    Comment:        The GeneXpert MRSA Assay (FDA approved for NASAL specimens only), is one component of a comprehensive MRSA colonization surveillance program. It is not intended to diagnose MRSA infection nor to guide or monitor treatment for MRSA infections.   Culture, sputum-assessment     Status: None   Collection Time: 07/22/15  8:23 PM  Result Value Ref Range Status   Specimen Description SPUTUM  Final   Special Requests NONE  Final   Sputum evaluation   Final    THIS SPECIMEN IS ACCEPTABLE. RESPIRATORY CULTURE REPORT TO FOLLOW.   Report Status 07/22/2015 FINAL  Final  Culture, respiratory (NON-Expectorated)     Status: None   Collection Time: 07/22/15 11:20 PM  Result Value Ref Range Status   Specimen Description EXPECTORATED SPUTUM  Final   Special Requests NONE  Final   Gram Stain   Final     ABUNDANT WBC PRESENT, PREDOMINANTLY PMN RARE SQUAMOUS EPITHELIAL CELLS PRESENT MODERATE GRAM NEGATIVE RODS MODERATE GRAM POSITIVE RODS RARE GRAM POSITIVE COCCI IN PAIRS THIS SPECIMEN IS ACCEPTABLE FOR SPUTUM CULTURE Performed at [facility]    Culture   Final    MODERATE PSEUDOMONAS AERUGINOSA Performed at [facility]    Report Status 07/25/2015 FINAL  Final   Organism ID, Bacteria PSEUDOMONAS AERUGINOSA  Final      Susceptibility   Pseudomonas aeruginosa - MIC*    CEFEPIME <=1 SENSITIVE Sensitive     CEFTAZIDIME  4 SENSITIVE Sensitive     CIPROFLOXACIN <=0.25 SENSITIVE Sensitive     GENTAMICIN <=1 SENSITIVE Sensitive     IMIPENEM 1 SENSITIVE Sensitive     PIP/TAZO <=4 SENSITIVE Sensitive     TOBRAMYCIN <=1 SENSITIVE Sensitive     * MODERATE PSEUDOMONAS AERUGINOSA     Labs: CBC:  Recent Labs Lab 07/22/15 0507 07/23/15 0415 07/24/15 0554 07/25/15 0341 07/26/15 1510  WBC 8.9 12.3* 11.6* 11.2* 11.6*  NEUTROABS  --  11.7* 9.0* 8.3* 9.0*  HGB 10.3* 10.3* 9.9* 10.1* 9.8*  HCT 33.4* 31.9* 30.8* 31.4* 32.4*  MCV 88.4 88.4 88.3 88.7 89.0  PLT 214 237 246 246 [id number]   Basic Metabolic Panel:  Recent Labs Lab 07/22/15 0507 07/23/15 0415 07/24/15 0554 07/25/15 0341 07/26/15 1510  NA 138 139 138 138 137  K 4.4 4.2 3.3* 3.8 3.4*  CL 100* 101 99* 99* 99*  CO2 27 29 29 27 28   GLUCOSE 135* 128* 113* 87 106*  BUN 11 16 18 18 15   CREATININE 0.71 0.82 1.01 0.81 0.97  CALCIUM 8.2* 8.6* 8.6* 8.4* 8.7*  MG  --  2.0 2.1 2.2  --    Liver Function Tests:  Recent Labs Lab 07/21/15 2138 07/23/15 0415 07/24/15 0554 07/25/15 0341 07/26/15 1510  AST 20 15 16 19 18   ALT 31 25 25 28 28   ALKPHOS 64 63 53 61 54  BILITOT 0.5 0.4 0.5 0.5 0.4  PROT 5.8* 6.0* 5.7* 5.6* 5.8*  ALBUMIN 3.3* 3.2* 3.0* 3.1* 3.3*    Recent Labs Lab 07/21/15 2138  LIPASE 27   No results for input(s): AMMONIA in the last 168 hours. Cardiac Enzymes:  Recent Labs Lab  07/21/15 2138 07/22/15 0207 07/22/15 0507 07/22/15 0735  TROPONINI 0.04* 0.03 0.03 0.03   BNP (last 3 results)  Recent Labs  07/21/15 2138 07/26/15 1510  BNP 54.8 36.5   CBG: No results for input(s): GLUCAP in the last 168 hours. Time spent: 30 minutes  Signed:  Berle Mull  [facility] 07/25/2015 , 7:22 PM

## 2015-07-26 NOTE — ED Notes (Signed)
Pt to ER via [facility] with complaint of shortness of breath and back pain inbetween shoulders. Pt wears daily O2 at 2L. Pt VS - 140/84, HR 90 BBB, RR 18, ETCO2 35, 100% SpO2 2L. Pt is alert and oriented x4.

## 2015-07-26 NOTE — ED Provider Notes (Signed)
CSN: [id number]     Arrival date & time 07/26/15  1416 History   First MD Initiated Contact with Patient 07/26/15 1512     Chief Complaint  Patient presents with  . Shortness of Breath     (Consider location/radiation/quality/duration/timing/severity/associated sxs/prior Treatment) HPI   Dustin Price is a 65 y.o. male who presents for evaluation of shortness of breath. He states it started yesterday. He also states that his chest pain, leg movement 6/10 in severity. The pain is sharp in nature. Using his usual medications at home without relief. He came here by EMS from [place], [place]. He was discharged from this hospital 5 days ago after admission for shortness of breath and chest pain. He was felt to be stable at discharge and there were no specific findings. Discharge diagnosis was COPD. He denies change in chronic cough. There's been no fever, chills, nausea, vomiting, weakness or dizziness. There are no other known modifying factors.  Past Medical History  Diagnosis Date  . Stroke ([place])   . COPD (chronic obstructive pulmonary disease) ([place])   . Hypertension   . Anxiety   . Seizures [facility])    Past Surgical History  Procedure Laterality Date  . Lung removal, partial      For "cysts on my lung"  . Brain surgery      For bleeding after stroke   Family History  Problem Relation Age of Onset  . Cancer Mother   . Cancer Father   . Heart disease Brother    Social History  Substance Use Topics  . Smoking status: Former Research scientist (life sciences)  . Smokeless tobacco: None  . Alcohol Use: None    Review of Systems  All other systems reviewed and are negative.     Allergies  Review of patient's allergies indicates no known allergies.  Home Medications   Prior to Admission medications   Medication Sig Start Date End Date Taking? Authorizing Provider  albuterol (PROVENTIL HFA;VENTOLIN HFA) 108 (90 Base) MCG/ACT inhaler Inhale 2 puffs into the lungs every 6 (six) hours as  needed for wheezing or shortness of breath.    Historical Provider, MD  albuterol (PROVENTIL) (2.5 MG/3ML) 0.083% nebulizer solution Take 2.5 mg by nebulization every 6 (six) hours as needed for wheezing or shortness of breath.    Historical Provider, MD  busPIRone (BUSPAR) 15 MG tablet Take 15 mg by mouth 2 (two) times daily. Reported on 07/21/2015    Historical Provider, MD  docusate sodium (COLACE) 100 MG capsule Take 100 mg by mouth 2 (two) times daily.    Historical Provider, MD  ferrous sulfate 325 (65 FE) MG tablet Take 325 mg by mouth daily with breakfast.    Historical Provider, MD  fluticasone (FLONASE) 50 MCG/ACT nasal spray Place 1 spray into both nostrils daily.    Historical Provider, MD  gabapentin (NEURONTIN) 300 MG capsule Take 300 mg by mouth 3 (three) times daily.    Historical Provider, MD  guaiFENesin (MUCINEX) 600 MG 12 hr tablet Take 1 tablet (600 mg total) by mouth 2 (two) times daily. 07/25/15   Lavina Hamman, MD  hydrOXYzine (ATARAX/VISTARIL) 50 MG tablet Take 50 mg by mouth 3 (three) times daily.    Historical Provider, MD  levofloxacin (LEVAQUIN) 750 MG tablet Take 1 tablet (750 mg total) by mouth daily. 07/25/15 08/01/15  Lavina Hamman, MD  omeprazole (PRILOSEC) 40 MG capsule Take 40 mg by mouth daily.    Historical Provider, MD  PARoxetine (  PAXIL) 20 MG tablet Take 20 mg by mouth daily.    Historical Provider, MD  predniSONE (DELTASONE) 10 MG tablet Take 50mg  daily for 3days,Take 40mg  daily for 3days,Take 30mg  daily for 3days,Take 20mg  daily for 3days,Take 10mg  daily for 3days, then stop 07/25/15   Lavina Hamman, MD  tiotropium (SPIRIVA) 18 MCG inhalation capsule Place 18 mcg into inhaler and inhale daily.    Historical Provider, MD  varenicline (CHANTIX) 1 MG tablet Take 1 mg by mouth daily.    Historical Provider, MD   BP 144/79 mmHg  Pulse 87  Temp(Src) 98.4 F (36.9 C) (Oral)  Resp 24  SpO2 100% Physical Exam  Constitutional: He is oriented to person, place, and  time. He appears well-developed and well-nourished. No distress.  HENT:  Head: Normocephalic and atraumatic.  Right Ear: External ear normal.  Left Ear: External ear normal.  Eyes: Conjunctivae and EOM are normal. Pupils are equal, round, and reactive to light.  Neck: Normal range of motion and phonation normal. Neck supple.  Cardiovascular: Normal rate, regular rhythm and normal heart sounds.   Pulmonary/Chest: Effort normal and breath sounds normal. He exhibits no bony tenderness.  There is no increased work of breathing. Lungs have diminished air movement bilaterally without audible wheezes, Rales or rhonchi  Abdominal: Soft. There is no tenderness.  Musculoskeletal: Normal range of motion. He exhibits no edema or tenderness.  Neurological: He is alert and oriented to person, place, and time. No cranial nerve deficit or sensory deficit. He exhibits normal muscle tone. Coordination normal.  Skin: Skin is warm, dry and intact.  Psychiatric: He has a normal mood and affect. His behavior is normal. Judgment and thought content normal.  Nursing note and vitals reviewed.   ED Course  Procedures (including critical care time)  Initial clinical impression- no acute disease. He is saturating, 100% on nasal cannula oxygen at 3 L, which is his norm. He was recently discharged from the hospital and does not appear to have any other acute issues at this time.  Review of records from [place] indicate frequent visits to emergency departments for similar problems, and rare hospital admissions. No coronary artery assessments were found on brief perusal. Recent emergency medicine note excerpt below: Malingering 07/20/2015  Overview:   Patient repeatedly comes to the [facility](well known to their staff) or [facility] ER (10+ times in the last month) with various complaints. Primarily around requesting placement, his anxiety or reported dyspnea (though negative work ups, on his home O2 - makes statements  such as "look at how hard it is for me to breath as he blows repeatedly through pursed lips". He does have a significant level of anxiety. Also frequently reports our medications/nebs work better than his or that his home O2 does not work well (Tourist information centre manager at [facility] has had his home O2 concentrator and tank evaluated more than once and it works normally). Has reported being out of his meds (though fill history is consistent and appropriate). He has had home health arranged but then is not always home when come to evaluate. He was previously at [facility] and [facility]. Left one because they wouldn't let him smoke, had to leave another because of behavorial issues such as repeatedly calling 911 and being abusive to staff. Outpatient Case Management with [facility] and Family have arranged for SNF placement at [facility] 08/07[facility]. There is also some concern about memory but no noted formal testing. Spoke with family during this admission. Reportedly  stays mostly by himself and nephew helps some. Family felt that Niece Peter [place] knew him best for medical insight and she felt they could help with medication and managing him until SNF placement at the end of the month.  - currently has home health arranged through [facility] - would avoid admission if no discrete objective reason or change in dyspnea     Medications  ipratropium-albuterol (DUONEB) 0.5-2.5 (3) MG/3ML nebulizer solution 3 mL (3 mLs Nebulization Given 07/26/15 1538)    Patient Vitals for the past 24 hrs:  BP Temp Temp src Pulse Resp SpO2  07/26/15 1615 144/79 mmHg - - 87 24 100 %  07/26/15 1530 149/82 mmHg - - 81 17 100 %  07/26/15 1445 152/87 mmHg - - 88 15 100 %  07/26/15 1430 133/92 mmHg - - 94 20 94 %  07/26/15 1426 132/84 mmHg 98.4 F (36.9 C) Oral 88 21 92 %  07/26/15 1423 - - - - - 91 %    4:49 PM Reevaluation with update and discussion. After initial assessment and treatment, an updated evaluation reveals no further complaints.  Findings discussed with patient and all questions were answered. Willian Donson L    Labs Review Labs Reviewed  CBC WITH DIFFERENTIAL/PLATELET - Abnormal; Notable for the following:    WBC 11.6 (*)    RBC 3.64 (*)    Hemoglobin 9.8 (*)    HCT 32.4 (*)    Neutro Abs 9.0 (*)    All other components within normal limits  COMPREHENSIVE METABOLIC PANEL - Abnormal; Notable for the following:    Potassium 3.4 (*)    Chloride 99 (*)    Glucose, Bld 106 (*)    Calcium 8.7 (*)    Total Protein 5.8 (*)    Albumin 3.3 (*)    All other components within normal limits  BRAIN NATRIURETIC PEPTIDE  I-STAT TROPOININ, ED    Imaging Review Dg Chest 2 View  07/26/2015  CLINICAL DATA:  Shortness of breath for 1 day.  Initial encounter. EXAM: CHEST  2 VIEW COMPARISON:  CT chest 07/22/2015.  PA and lateral chest 07/21/2015. FINDINGS: The lungs are severely emphysematous but clear. Heart size is normal. No pneumothorax or pleural effusion. The patient is status post CABG. IMPRESSION: Emphysema without acute disease. Electronically Signed   By: Inge Rise M.D.   On: 07/26/2015 16:00   I have personally reviewed and evaluated these images and lab results as part of my medical decision-making.   EKG Interpretation   Date/Time:  Tuesday July 26 2015 14:27:29 EDT Ventricular Rate:  87 PR Interval:  122 QRS Duration: 119 QT Interval:  390 QTC Calculation: 469 R Axis:   73 Text Interpretation:  Sinus rhythm Incomplete right bundle branch block No  significant change since last tracing Confirmed by FLOYD MD, Quillian Quince  [id number]) on 07/26/2015 2:35:13 PM      MDM   Final diagnoses:  Chronic obstructive pulmonary disease, unspecified COPD type (HCC)  Nonspecific chest pain    Stable COPD. Unspecified psychosocial issues, with patient now seeking treatment venue. His never been to this facility before his evaluation and admit here on 07/21/2015. This is clearly after he was confronted on 07/20/2015  with malingering behavior. Doubt ACS, pneumonia, PE, exacerbation COPD or impending vascular collapse.  Nursing Notes Reviewed/ Care Coordinated, and agree without changes. Applicable Imaging Reviewed.  Interpretation of Laboratory Data incorporated into ED treatment  Nursing Notes Reviewed/ Care Coordinated Applicable Imaging Reviewed Interpretation of Laboratory Data incorporated  into ED treatment  The patient appears reasonably screened and/or stabilized for discharge and I doubt any other medical condition or other [facility] requiring further screening, evaluation, or treatment in the ED at this time prior to discharge.  Plan: Home Medications- usual; Home Treatments- rest; return here if the recommended treatment, does not improve the symptoms; Recommended follow up- PCP prn    Daleen Bo, MD 07/26/15 1708

## 2015-07-27 LAB — CULTURE, BLOOD (ROUTINE X 2)
CULTURE: NO GROWTH
CULTURE: NO GROWTH

## 2015-07-28 ENCOUNTER — Emergency Department (HOSPITAL_COMMUNITY): Payer: Medicare Other

## 2015-07-28 ENCOUNTER — Inpatient Hospital Stay (HOSPITAL_COMMUNITY)
Admission: EM | Admit: 2015-07-28 | Discharge: 2015-08-01 | DRG: 190 | Disposition: A | Discharge status: Skilled Nursing Facility | Payer: Medicare Other | Attending: Internal Medicine | Admitting: Internal Medicine

## 2015-07-28 ENCOUNTER — Encounter (HOSPITAL_COMMUNITY): Payer: Self-pay | Admitting: Emergency Medicine

## 2015-07-28 DIAGNOSIS — K219 Gastro-esophageal reflux disease without esophagitis: Secondary | ICD-10-CM | POA: Diagnosis present

## 2015-07-28 DIAGNOSIS — K5900 Constipation, unspecified: Secondary | ICD-10-CM | POA: Diagnosis not present

## 2015-07-28 DIAGNOSIS — J441 Chronic obstructive pulmonary disease with (acute) exacerbation: Principal | ICD-10-CM | POA: Diagnosis present

## 2015-07-28 DIAGNOSIS — R079 Chest pain, unspecified: Secondary | ICD-10-CM | POA: Diagnosis present

## 2015-07-28 DIAGNOSIS — Z79899 Other long term (current) drug therapy: Secondary | ICD-10-CM

## 2015-07-28 DIAGNOSIS — Z9981 Dependence on supplemental oxygen: Secondary | ICD-10-CM

## 2015-07-28 DIAGNOSIS — J9621 Acute and chronic respiratory failure with hypoxia: Secondary | ICD-10-CM | POA: Diagnosis present

## 2015-07-28 DIAGNOSIS — R109 Unspecified abdominal pain: Secondary | ICD-10-CM | POA: Diagnosis present

## 2015-07-28 DIAGNOSIS — R000 Tachycardia, unspecified: Secondary | ICD-10-CM | POA: Diagnosis present

## 2015-07-28 DIAGNOSIS — D649 Anemia, unspecified: Secondary | ICD-10-CM | POA: Diagnosis present

## 2015-07-28 DIAGNOSIS — R1013 Epigastric pain: Secondary | ICD-10-CM | POA: Diagnosis not present

## 2015-07-28 DIAGNOSIS — F329 Major depressive disorder, single episode, unspecified: Secondary | ICD-10-CM | POA: Diagnosis present

## 2015-07-28 DIAGNOSIS — D72829 Elevated white blood cell count, unspecified: Secondary | ICD-10-CM | POA: Diagnosis present

## 2015-07-28 DIAGNOSIS — F32A Depression, unspecified: Secondary | ICD-10-CM

## 2015-07-28 DIAGNOSIS — T380X5A Adverse effect of glucocorticoids and synthetic analogues, initial encounter: Secondary | ICD-10-CM | POA: Diagnosis present

## 2015-07-28 DIAGNOSIS — Z8673 Personal history of transient ischemic attack (TIA), and cerebral infarction without residual deficits: Secondary | ICD-10-CM

## 2015-07-28 DIAGNOSIS — Z8249 Family history of ischemic heart disease and other diseases of the circulatory system: Secondary | ICD-10-CM

## 2015-07-28 DIAGNOSIS — Z720 Tobacco use: Secondary | ICD-10-CM | POA: Diagnosis present

## 2015-07-28 DIAGNOSIS — R0602 Shortness of breath: Secondary | ICD-10-CM

## 2015-07-28 DIAGNOSIS — F419 Anxiety disorder, unspecified: Secondary | ICD-10-CM | POA: Diagnosis present

## 2015-07-28 DIAGNOSIS — I10 Essential (primary) hypertension: Secondary | ICD-10-CM | POA: Diagnosis present

## 2015-07-28 DIAGNOSIS — Z7952 Long term (current) use of systemic steroids: Secondary | ICD-10-CM

## 2015-07-28 HISTORY — DX: Depression, unspecified: F32.A

## 2015-07-28 HISTORY — DX: Tobacco use: Z72.0

## 2015-07-28 HISTORY — DX: Major depressive disorder, single episode, unspecified: F32.9

## 2015-07-28 LAB — TROPONIN I: Troponin I: 0.03 ng/mL (ref <0.031)

## 2015-07-28 LAB — PROTIME-INR
INR: 1.05 (ref 0.00–1.49)
PROTHROMBIN TIME: 13.9 s (ref 11.6–15.2)

## 2015-07-28 LAB — BASIC METABOLIC PANEL
Anion gap: 12 (ref 5–15)
BUN: 13 mg/dL (ref 6–20)
CALCIUM: 9.4 mg/dL (ref 8.9–10.3)
CO2: 29 mmol/L (ref 22–32)
CREATININE: 0.9 mg/dL (ref 0.61–1.24)
Chloride: 97 mmol/L — ABNORMAL LOW (ref 101–111)
GFR calc Af Amer: >60 mL/min (ref >60)
GLUCOSE: 142 mg/dL — AB (ref 65–99)
POTASSIUM: 4.5 mmol/L (ref 3.5–5.1)
SODIUM: 138 mmol/L (ref 135–145)

## 2015-07-28 LAB — APTT: APTT: 23 s — AB (ref 24–37)

## 2015-07-28 LAB — LIPASE, BLOOD: Lipase: 23 U/L (ref 11–51)

## 2015-07-28 LAB — CBC
HCT: 38.6 % — ABNORMAL LOW (ref 39.0–52.0)
Hemoglobin: 12 g/dL — ABNORMAL LOW (ref 13.0–17.0)
MCH: 27.7 pg (ref 26.0–34.0)
MCHC: 31.1 g/dL (ref 30.0–36.0)
MCV: 89.1 fL (ref 78.0–100.0)
PLATELETS: 391 10*3/uL (ref 150–400)
RBC: 4.33 MIL/uL (ref 4.22–5.81)
RDW: 15 % (ref 11.5–15.5)
WBC: 11.9 10*3/uL — AB (ref 4.0–10.5)

## 2015-07-28 LAB — I-STAT TROPONIN, ED: Troponin i, poc: 0 ng/mL (ref 0.00–0.08)

## 2015-07-28 MED ORDER — FLUTICASONE PROPIONATE 50 MCG/ACT NA SUSP
1.0000 | Freq: Every day | NASAL | Status: DC
  Administered 2015-07-29 – 2015-08-01 (×4): 1 via NASAL
  Filled 2015-07-28: qty 16

## 2015-07-28 MED ORDER — MORPHINE SULFATE (PF) 2 MG/ML IV SOLN
1.0000 mg | INTRAVENOUS | Status: DC | PRN
  Administered 2015-07-31 (×2): 1 mg via INTRAVENOUS
  Filled 2015-07-28 (×2): qty 1

## 2015-07-28 MED ORDER — ONDANSETRON HCL 4 MG/2ML IJ SOLN: 4.0000 mg | Freq: Three times a day (TID) | INTRAMUSCULAR | Status: DC | PRN

## 2015-07-28 MED ORDER — OXYCODONE-ACETAMINOPHEN 5-325 MG PO TABS
1.0000 | ORAL_TABLET | ORAL | Status: DC | PRN
  Administered 2015-07-31: 1 via ORAL
  Filled 2015-07-28: qty 1

## 2015-07-28 MED ORDER — VARENICLINE TARTRATE 1 MG PO TABS
1.0000 mg | ORAL_TABLET | Freq: Every day | ORAL | Status: DC
  Administered 2015-07-29 – 2015-08-01 (×4): 1 mg via ORAL
  Filled 2015-07-28 (×5): qty 1

## 2015-07-28 MED ORDER — SODIUM CHLORIDE 0.9 % IV SOLN
INTRAVENOUS | Status: DC
  Administered 2015-07-28: via INTRAVENOUS
  Administered 2015-07-29: 100 mL/h via INTRAVENOUS
  Administered 2015-07-29 – 2015-07-30 (×2): via INTRAVENOUS

## 2015-07-28 MED ORDER — IPRATROPIUM-ALBUTEROL 0.5-2.5 (3) MG/3ML IN SOLN
3.0000 mL | Freq: Four times a day (QID) | RESPIRATORY_TRACT | Status: DC
  Administered 2015-07-29 – 2015-08-01 (×16): 3 mL via RESPIRATORY_TRACT
  Filled 2015-07-28 (×16): qty 3

## 2015-07-28 MED ORDER — CETYLPYRIDINIUM CHLORIDE 0.05 % MT LIQD
7.0000 mL | Freq: Two times a day (BID) | OROMUCOSAL | Status: DC
  Administered 2015-07-28 – 2015-08-01 (×8): 7 mL via OROMUCOSAL

## 2015-07-28 MED ORDER — PAROXETINE HCL 20 MG PO TABS
20.0000 mg | ORAL_TABLET | Freq: Every day | ORAL | Status: DC
  Administered 2015-07-29 – 2015-08-01 (×4): 20 mg via ORAL
  Filled 2015-07-28 (×4): qty 1

## 2015-07-28 MED ORDER — IPRATROPIUM-ALBUTEROL 0.5-2.5 (3) MG/3ML IN SOLN
3.0000 mL | RESPIRATORY_TRACT | Status: DC
  Administered 2015-07-28: 3 mL via RESPIRATORY_TRACT
  Filled 2015-07-28: qty 3

## 2015-07-28 MED ORDER — FERROUS SULFATE 325 (65 FE) MG PO TABS
325.0000 mg | ORAL_TABLET | Freq: Every day | ORAL | Status: DC
  Administered 2015-07-29 – 2015-08-01 (×4): 325 mg via ORAL
  Filled 2015-07-28 (×4): qty 1

## 2015-07-28 MED ORDER — METHYLPREDNISOLONE SODIUM SUCC 125 MG IJ SOLR
60.0000 mg | Freq: Two times a day (BID) | INTRAMUSCULAR | Status: DC
  Administered 2015-07-28 – 2015-07-31 (×6): 60 mg via INTRAVENOUS
  Filled 2015-07-28 (×6): qty 2

## 2015-07-28 MED ORDER — IPRATROPIUM-ALBUTEROL 0.5-2.5 (3) MG/3ML IN SOLN
3.0000 mL | Freq: Once | RESPIRATORY_TRACT | Status: AC
Start: 2015-07-28 — End: 2015-07-28
  Administered 2015-07-28: 3 mL via RESPIRATORY_TRACT
  Filled 2015-07-28: qty 3

## 2015-07-28 MED ORDER — GUAIFENESIN ER 600 MG PO TB12
600.0000 mg | ORAL_TABLET | Freq: Two times a day (BID) | ORAL | Status: DC
  Administered 2015-07-29 – 2015-08-01 (×6): 600 mg via ORAL
  Filled 2015-07-28 (×6): qty 1

## 2015-07-28 MED ORDER — DOCUSATE SODIUM 100 MG PO CAPS
100.0000 mg | ORAL_CAPSULE | Freq: Two times a day (BID) | ORAL | Status: DC
  Administered 2015-07-29 – 2015-08-01 (×6): 100 mg via ORAL
  Filled 2015-07-28 (×6): qty 1

## 2015-07-28 MED ORDER — ENOXAPARIN SODIUM 40 MG/0.4ML ~~LOC~~ SOLN
40.0000 mg | Freq: Every day | SUBCUTANEOUS | Status: DC
  Administered 2015-07-28 – 2015-07-31 (×4): 40 mg via SUBCUTANEOUS
  Filled 2015-07-28 (×4): qty 0.4

## 2015-07-28 MED ORDER — LEVOFLOXACIN 750 MG PO TABS
750.0000 mg | ORAL_TABLET | Freq: Every day | ORAL | Status: DC
  Administered 2015-07-29 – 2015-08-01 (×4): 750 mg via ORAL
  Filled 2015-07-28 (×4): qty 1

## 2015-07-28 MED ORDER — PANTOPRAZOLE SODIUM 40 MG PO TBEC
40.0000 mg | DELAYED_RELEASE_TABLET | Freq: Every day | ORAL | Status: DC
  Administered 2015-07-29 – 2015-08-01 (×4): 40 mg via ORAL
  Filled 2015-07-28 (×4): qty 1

## 2015-07-28 MED ORDER — ALBUTEROL SULFATE (2.5 MG/3ML) 0.083% IN NEBU
2.5000 mg | INHALATION_SOLUTION | RESPIRATORY_TRACT | Status: AC | PRN
Start: 2015-07-28 — End: 2015-07-29
  Administered 2015-07-29: 2.5 mg via RESPIRATORY_TRACT
  Filled 2015-07-28: qty 3

## 2015-07-28 MED ORDER — CYCLOBENZAPRINE HCL 5 MG PO TABS: 5.0000 mg | ORAL_TABLET | Freq: Three times a day (TID) | ORAL | Status: DC | PRN

## 2015-07-28 MED ORDER — IPRATROPIUM-ALBUTEROL 0.5-2.5 (3) MG/3ML IN SOLN
3.0000 mL | Freq: Once | RESPIRATORY_TRACT | Status: AC
  Administered 2015-07-28: 3 mL via RESPIRATORY_TRACT
  Filled 2015-07-28: qty 3

## 2015-07-28 MED ORDER — POLYETHYLENE GLYCOL 3350 17 G PO PACK
17.0000 g | PACK | Freq: Every day | ORAL | Status: DC
  Administered 2015-07-29 – 2015-08-01 (×4): 17 g via ORAL
  Filled 2015-07-28 (×4): qty 1

## 2015-07-28 MED ORDER — ASPIRIN EC 81 MG PO TBEC
81.0000 mg | DELAYED_RELEASE_TABLET | Freq: Every day | ORAL | Status: DC
  Administered 2015-07-29 – 2015-08-01 (×4): 81 mg via ORAL
  Filled 2015-07-28 (×4): qty 1

## 2015-07-28 MED ORDER — TIOTROPIUM BROMIDE MONOHYDRATE 18 MCG IN CAPS
18.0000 ug | ORAL_CAPSULE | Freq: Every day | RESPIRATORY_TRACT | Status: DC
  Administered 2015-07-29 – 2015-08-01 (×4): 18 ug via RESPIRATORY_TRACT
  Filled 2015-07-28: qty 5

## 2015-07-28 MED ORDER — BUSPIRONE HCL 15 MG PO TABS
15.0000 mg | ORAL_TABLET | Freq: Two times a day (BID) | ORAL | Status: DC
  Administered 2015-07-29 – 2015-08-01 (×6): 15 mg via ORAL
  Filled 2015-07-28 (×6): qty 1

## 2015-07-28 MED ORDER — HYDROXYZINE HCL 25 MG PO TABS
50.0000 mg | ORAL_TABLET | Freq: Three times a day (TID) | ORAL | Status: DC
  Administered 2015-07-29 – 2015-08-01 (×10): 50 mg via ORAL
  Filled 2015-07-28 (×10): qty 2

## 2015-07-28 MED ORDER — GABAPENTIN 300 MG PO CAPS
300.0000 mg | ORAL_CAPSULE | Freq: Three times a day (TID) | ORAL | Status: DC
  Administered 2015-07-29 – 2015-08-01 (×10): 300 mg via ORAL
  Filled 2015-07-28 (×10): qty 1

## 2015-07-28 NOTE — Progress Notes (Signed)
  Pt admitted to the unit. Pt is stable, alert and oriented per baseline. Oriented to room, staff, and call bell. Educated to call for any assistance. Bed in lowest position, call bell within reach- will continue to monitor. 

## 2015-07-28 NOTE — ED Notes (Signed)
Per EMS, patient states shortness of breath and has been called out several times in the last week for same.   No additional complaints.  Patient talking in full sentences at triage, and screaming for oxygen while we were getting the oxygen. EMS advised they were going to call security and patient answered "I ain't worried about the police".

## 2015-07-28 NOTE — ED Notes (Signed)
Attempted IV start x2 with no success. Spoke with 6E. Ok to send pt while he awaits IV team.

## 2015-07-28 NOTE — ED Notes (Signed)
Patient states now having chest pain

## 2015-07-28 NOTE — Progress Notes (Signed)
Patient due for CT of abd with contrast- orders to hold all p.o. Medications tonight. Patient is aware.

## 2015-07-28 NOTE — H&P (Addendum)
History and Physical    Dustin Price [id number] DOB: [birth year] DOA: 07/28/2015  Referring MD/NP/PA: none PCP: No PCP Per Patient   Outpatient Specialists: none Patient coming from:  Home     Chief Complaint: Shortness of breath, chest pain and abdominal pain  HPI: Dustin Price is a 65 y.o. 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  Patient was recently hospitalized from 4/13-4/17/17 due to COPD exacerbation and chest pain. He had negative CTA chest (no PE), normal echo, and cardiac workup negative. He was discharged on Levaquin and tapering dose prednisone. The patient does not have any of his inhalers with him, he states that he was staying with his nephew, who was arrested and thrown in jail. He states that he has worsening shortness of breath in the past 2 days. He also has productive cough with brownish colored sputum production. He haa chest pain over left sided chest, constant, pleuritic in nature, 9 out of 10 in severity. His pain radiates into the left upper back. He has tenderness over left upper back in the paraspinal muscle area. He denies any injury. He also reports having abdominal pain over upper abdomen. It is moderate, intermittent, nonradiating. He has nausea and vomited twice without blood in the vomitus. He is constipated for 3 days. Patient does not have symptoms of UTI or unilateral weakness. No fever or chills. He is a poor historian, he can not tell if he is taking steroids and antibiotics.   ED Course: pt was found to have WBC 11.9, temperature normal, transient tachycardia, transient tachypnea, electrolytes and renal function okay, negative troponin. Chest x-ray showed severe emphysema but no infiltration. Patient is admitted to inpatient for further eval of containment and  observation.  Can patient participate in ADLs?   Little  Review of Systems:   General: no fevers, chills, no changes in body weight, has poor appetite, has fatigue HEENT: no blurry vision, hearing changes or sore throat Pulm: has dyspnea, coughing, no wheezing CV: has chest pain, no palpitations Abd: has nausea, vomiting, abdominal pain, constipation. No diarrhea,  GU: no dysuria, burning on urination, increased urinary frequency, hematuria  Ext: no leg edema Neuro: no unilateral weakness, numbness, or tingling, no vision change or hearing loss Skin: no rash MSK: has pain over left upper back Heme: No easy bruising.  Travel history: No recent long distant travel.  Allergy: No Known Allergies  Past Medical History  Diagnosis Date  . Stroke ([place])   . COPD (chronic obstructive pulmonary disease) ([place])   . Hypertension   . Anxiety   . Seizures ([place])   . Depression   . Tobacco abuse     Past Surgical History  Procedure Laterality Date  . Lung removal, partial      For "cysts on my lung"  . Brain surgery      For bleeding after stroke    Social History:  reports that he has never smoked. He does not have any smokeless tobacco history on file. He reports that he drinks alcohol. His drug history is not on file.  Family History:  Family History  Problem Relation Age of Onset  . Cancer Mother   . Cancer Father   . Heart disease Brother      Prior to Admission medications   Medication Sig Start Date End Date Taking?  Authorizing Provider  albuterol (PROVENTIL HFA;VENTOLIN HFA) 108 (90 Base) MCG/ACT inhaler Inhale 2 puffs into the lungs every 6 (six) hours as needed for wheezing or shortness of breath.    Historical Provider, MD  albuterol (PROVENTIL) (2.5 MG/3ML) 0.083% nebulizer solution Take 2.5 mg by nebulization every 6 (six) hours as needed for wheezing or shortness of breath.    Historical Provider, MD  busPIRone (BUSPAR) 15 MG tablet Take 15 mg by mouth 2 (two) times  daily. Reported on 07/21/2015    Historical Provider, MD  docusate sodium (COLACE) 100 MG capsule Take 100 mg by mouth 2 (two) times daily.    Historical Provider, MD  ferrous sulfate 325 (65 FE) MG tablet Take 325 mg by mouth daily with breakfast.    Historical Provider, MD  fluticasone (FLONASE) 50 MCG/ACT nasal spray Place 1 spray into both nostrils daily.    Historical Provider, MD  gabapentin (NEURONTIN) 300 MG capsule Take 300 mg by mouth 3 (three) times daily.    Historical Provider, MD  guaiFENesin (MUCINEX) 600 MG 12 hr tablet Take 1 tablet (600 mg total) by mouth 2 (two) times daily. 07/25/15   Lavina Hamman, MD  hydrOXYzine (ATARAX/VISTARIL) 50 MG tablet Take 50 mg by mouth 3 (three) times daily.    Historical Provider, MD  levofloxacin (LEVAQUIN) 750 MG tablet Take 1 tablet (750 mg total) by mouth daily. 07/25/15 08/01/15  Lavina Hamman, MD  omeprazole (PRILOSEC) 40 MG capsule Take 40 mg by mouth daily.    Historical Provider, MD  PARoxetine (PAXIL) 20 MG tablet Take 20 mg by mouth daily.    Historical Provider, MD  predniSONE (DELTASONE) 10 MG tablet Take 50mg  daily for 3days,Take 40mg  daily for 3days,Take 30mg  daily for 3days,Take 20mg  daily for 3days,Take 10mg  daily for 3days, then stop 07/25/15   Lavina Hamman, MD  tiotropium (SPIRIVA) 18 MCG inhalation capsule Place 18 mcg into inhaler and inhale daily.    Historical Provider, MD  varenicline (CHANTIX) 1 MG tablet Take 1 mg by mouth daily.    Historical Provider, MD    Physical Exam: Filed Vitals:   07/28/15 1715 07/28/15 1730 07/28/15 1800 07/28/15 1830  BP: 142/92 131/111 140/91 115/74  Pulse: 94 92 93 89  Temp:      TempSrc:      Resp: 16 15 16 15   SpO2: 98% 100% 98% 100%   General: Not in acute distress HEENT:       Eyes: PERRL, EOMI, no scleral icterus.       ENT: No discharge from the ears and nose, no pharynx injection, no tonsillar enlargement.        Neck: No JVD, no bruit, no mass felt. Heme: No neck lymph node  enlargement. Cardiac: S1/S2, RRR, No murmurs, No gallops or rubs. Pulm: Severely decreased air movement bilaterally. No rales, wheezing, rhonchi or rubs. Abd: Soft, nondistended, has tenderness over upper abdomen. There is a small ventral hernia (~2 cm in size) over upper abdomen, with mild tenderness over this hermia, no rebound pain, no organomegaly, BS present. GU: No hematuria Ext: No pitting leg edema bilaterally. 2+DP/PT pulse bilaterally. Musculoskeletal: there is tenderness over left upper back in paraspinal muscle area, no tenderness over midline of spine. Skin: No rashes.  Neuro: Alert, oriented X3, cranial nerves II-XII grossly intact, moves all extremities normally. Psych: Patient is not psychotic, no suicidal or hemocidal ideation.  Labs on Admission: I have personally reviewed following labs and imaging studies  CBC:  Recent Labs Lab 07/23/15 0415 07/24/15 0554 07/25/15 0341 07/26/15 1510 07/28/15 1433  WBC 12.3* 11.6* 11.2* 11.6* 11.9*  NEUTROABS 11.7* 9.0* 8.3* 9.0*  --   HGB 10.3* 9.9* 10.1* 9.8* 12.0*  HCT 31.9* 30.8* 31.4* 32.4* 38.6*  MCV 88.4 88.3 88.7 89.0 89.1  PLT 237 246 246 270 0000000   Basic Metabolic Panel:  Recent Labs Lab 07/23/15 0415 07/24/15 0554 07/25/15 0341 07/26/15 1510 07/28/15 1433  NA 139 138 138 137 138  K 4.2 3.3* 3.8 3.4* 4.5  CL 101 99* 99* 99* 97*  CO2 29 29 27 28 29   GLUCOSE 128* 113* 87 106* 142*  BUN 16 18 18 15 13   CREATININE 0.82 1.01 0.81 0.97 0.90  CALCIUM 8.6* 8.6* 8.4* 8.7* 9.4  MG 2.0 2.1 2.2  --   --    GFR: Estimated Creatinine Clearance: 69.4 mL/min (by C-G formula based on Cr of 0.9). Liver Function Tests:  Recent Labs Lab 07/21/15 2138 07/23/15 0415 07/24/15 0554 07/25/15 0341 07/26/15 1510  AST 20 15 16 19 18   ALT 31 25 25 28 28   ALKPHOS 64 63 53 61 54  BILITOT 0.5 0.4 0.5 0.5 0.4  PROT 5.8* 6.0* 5.7* 5.6* 5.8*  ALBUMIN 3.3* 3.2* 3.0* 3.1* 3.3*    Recent Labs Lab 07/21/15 2138  LIPASE 27    No results for input(s): AMMONIA in the last 168 hours. Coagulation Profile: No results for input(s): INR, PROTIME in the last 168 hours. Cardiac Enzymes:  Recent Labs Lab 07/21/15 2138 07/22/15 0207 07/22/15 0507 07/22/15 0735  TROPONINI 0.04* 0.03 0.03 0.03   BNP (last 3 results) No results for input(s): PROBNP in the last 8760 hours. HbA1C: No results for input(s): HGBA1C in the last 72 hours. CBG: No results for input(s): GLUCAP in the last 168 hours. Lipid Profile: No results for input(s): CHOL, HDL, LDLCALC, TRIG, CHOLHDL, LDLDIRECT in the last 72 hours. Thyroid Function Tests: No results for input(s): TSH, T4TOTAL, FREET4, T3FREE, THYROIDAB in the last 72 hours. Anemia Panel: No results for input(s): VITAMINB12, FOLATE, FERRITIN, TIBC, IRON, RETICCTPCT in the last 72 hours. Urine analysis: No results found for: COLORURINE, APPEARANCEUR, LABSPEC, PHURINE, GLUCOSEU, HGBUR, BILIRUBINUR, KETONESUR, PROTEINUR, UROBILINOGEN, NITRITE, LEUKOCYTESUR Sepsis Labs: @LABRCNTIP (procalcitonin:4,lacticidven:4) ) Recent Results (from the past 240 hour(s))  Culture, blood (routine x 2) Call MD if unable to obtain prior to antibiotics being given     Status: None   Collection Time: 07/22/15 12:00 AM  Result Value Ref Range Status   Specimen Description BLOOD LEFT ANTECUBITAL  Final   Special Requests BOTTLES DRAWN AEROBIC AND ANAEROBIC 5CC  Final   Culture NO GROWTH 5 DAYS  Final   Report Status 07/27/2015 FINAL  Final  Culture, blood (routine x 2) Call MD if unable to obtain prior to antibiotics being given     Status: None   Collection Time: 07/22/15 12:05 AM  Result Value Ref Range Status   Specimen Description BLOOD LEFT ARM  Final   Special Requests BOTTLES DRAWN AEROBIC AND ANAEROBIC 5CC  Final   Culture NO GROWTH 5 DAYS  Final   Report Status 07/27/2015 FINAL  Final  MRSA PCR Screening     Status: None   Collection Time: 07/22/15  1:49 AM  Result Value Ref Range Status    MRSA by PCR NEGATIVE NEGATIVE Final    Comment:        The GeneXpert MRSA Assay (FDA approved for NASAL specimens only), is one component of a  comprehensive MRSA colonization surveillance program. It is not intended to diagnose MRSA infection nor to guide or monitor treatment for MRSA infections.   Culture, sputum-assessment     Status: None   Collection Time: 07/22/15  8:23 PM  Result Value Ref Range Status   Specimen Description SPUTUM  Final   Special Requests NONE  Final   Sputum evaluation   Final    THIS SPECIMEN IS ACCEPTABLE. RESPIRATORY CULTURE REPORT TO FOLLOW.   Report Status 07/22/2015 FINAL  Final  Culture, respiratory (NON-Expectorated)     Status: None   Collection Time: 07/22/15 11:20 PM  Result Value Ref Range Status   Specimen Description EXPECTORATED SPUTUM  Final   Special Requests NONE  Final   Gram Stain   Final    ABUNDANT WBC PRESENT, PREDOMINANTLY PMN RARE SQUAMOUS EPITHELIAL CELLS PRESENT MODERATE GRAM NEGATIVE RODS MODERATE GRAM POSITIVE RODS RARE GRAM POSITIVE COCCI IN PAIRS THIS SPECIMEN IS ACCEPTABLE FOR SPUTUM CULTURE Performed at [facility]    Culture   Final    MODERATE PSEUDOMONAS AERUGINOSA Performed at [facility]    Report Status 07/25/2015 FINAL  Final   Organism ID, Bacteria PSEUDOMONAS AERUGINOSA  Final      Susceptibility   Pseudomonas aeruginosa - MIC*    CEFEPIME <=1 SENSITIVE Sensitive     CEFTAZIDIME 4 SENSITIVE Sensitive     CIPROFLOXACIN <=0.25 SENSITIVE Sensitive     GENTAMICIN <=1 SENSITIVE Sensitive     IMIPENEM 1 SENSITIVE Sensitive     PIP/TAZO <=4 SENSITIVE Sensitive     TOBRAMYCIN <=1 SENSITIVE Sensitive     * MODERATE PSEUDOMONAS AERUGINOSA     Radiological Exams on Admission: Dg Chest 2 View  07/28/2015  CLINICAL DATA:  Shortness of breath. Back pain between the shoulder blades beginning yesterday. Initial encounter. EXAM: CHEST  2 VIEW COMPARISON:  PA and lateral chest 07/26/2015.  CT  chest 07/22/2015. FINDINGS: The lungs are severely emphysematous but clear. Heart size is normal. No pneumothorax or pleural effusion. The patient is status post median sternotomy. Surgical clips left upper lobe are noted. IMPRESSION: Severe appearing emphysema without acute disease. Electronically Signed   By: Inge Rise M.D.   On: 07/28/2015 15:16     EKG: Independently reviewed.  QTC 467, old right bundle blockage, early R-wave progression  Assessment/Plan Principal Problem:   COPD exacerbation (HCC) Active Problems:   Anxiety   Chest pain   GERD (gastroesophageal reflux disease)   Depression   Abdominal pain   Acute on chronic respiratory failure with hypoxia (HCC)   Tobacco abuse   Acute on chronic respiratory failure with hypoxia due to COPD exacerbation/severe emphysema: Patient's shortness of breath, productive cough and severely decreased element, consistent with COPD exacerbation. Pt had negative CTA for PE and negative flu pcr in recent admisison.   -will admit patient to telemetry bed  -Nebulizers: scheduled Duoneb and prn albuterol -continue home spiriva inhaler -Solu-Medrol 60 mg IV bid -continue to complete oral levaquin to 4/24  -Mucinex for cough  -Urine S. pneumococcal antigen -Follow up blood culture x2, sputum culture  CP: likely due to musculoskeletal strain secondary to COPD and coughing. He had negative CTA for PE on 07/22/15. CXR today has no pneumothorax or pleural effusion, and no infiltration. He had TTE on 07/23/15 which did not have pericardial effusion, EF 60-65%. -prn percocet and morphine -start lowe dose of ASA -trop x 3 -check A1c and FLP  Back pain: Patient has a tenderness over  left upper back in the paraspinal muscle area, indicating musculoskeletal pain. No recent injury. -When necessary Flexeril and Percocet  Abdominal pain: Likely due to constipation, but the patient has a small hernia over upper abdomen with mild tenderness.  -will  check lipase -CT-abd/pelvis to r/o hernia incarceration -When necessary Zofran for nausea and morphine for pain -MiraLAX, Colace,   Depression: Stable, no suicidal or homicidal ideations. -Continue home medications: BuSpar and Paxil  GERD: -Protonix  Tobacco abuse:  -did counseling about the importance of quitting smoking -on Chantix   DVT ppx: SQ Lovenox Code Status: Full code Family Communication: None at bed side. Disposition Plan:  Anticipate discharge back to previous home environment Consults called: none Admission status:   obs / tele    Date of Service 07/28/2015    Ivor Costa [facility] Pager [phone]  If 7PM-7AM, please contact night-coverage [email or web address] Password [facility] 07/28/2015, 8:31 PM

## 2015-07-28 NOTE — ED Provider Notes (Signed)
CSN: [id number]     Arrival date & time 07/28/15  1401 History   First MD Initiated Contact with Patient 07/28/15 1738     Chief Complaint  Patient presents with  . Chest Pain  . Shortness of Breath     (Consider location/radiation/quality/duration/timing/severity/associated sxs/prior Treatment) The history is provided by the patient and medical records.    Dustin Price is a 65 y.o. male pertinent medical history of severe emphysema/COPD, hypertension, anxiety, CVA, he presents to the ER with worsening chest pain shortness of breath.  He was admitted by [facility] for the same symptoms, one week ago, was treated for COPD exacerbation, CP was decided pleuritic in nature with negative CT chest, normal echo, and cardiac workup negative.  He was discharged with Mucinex, Levaquin, prednisone, and multiple breathing treatments. He is on home O2, states that over the last 2 days he has increased oxygen from 2-3 L without much improvement of the shortness of breath.  The patient does not have any of his inhalers with him, he states that he has unintentionally been locked out of the home or staying with his nephew, who was arrested and thrown in jail. He is in disposable ER scrubs and states he has no place to go tonight. He describes this chest pain as left sided, constant and severe, described as stabbing and associated with his shortness of breath.   He is a poor historian, for example he is unaware that he is taking steroids and antibiotics.  He denies fever, chills, nausea, vomiting, weakness or dizziness. There are no other known modifying factors.  Past Medical History  Diagnosis Date  . Stroke ([place])   . COPD (chronic obstructive pulmonary disease) ([place])   . Hypertension   . Anxiety   . Seizures [facility])    Past Surgical History  Procedure Laterality Date  . Lung removal, partial      For "cysts on my lung"  . Brain surgery      For bleeding after stroke   Family History  Problem Relation Age  of Onset  . Cancer Mother   . Cancer Father   . Heart disease Brother    Social History  Substance Use Topics  . Smoking status: Never Smoker   . Smokeless tobacco: None  . Alcohol Use: 0.0 oz/week    0 Standard drinks or equivalent per week    Review of Systems  All other systems reviewed and are negative.     Allergies  Review of patient's allergies indicates no known allergies.  Home Medications   Prior to Admission medications   Medication Sig Start Date End Date Taking? Authorizing Provider  albuterol (PROVENTIL HFA;VENTOLIN HFA) 108 (90 Base) MCG/ACT inhaler Inhale 2 puffs into the lungs every 6 (six) hours as needed for wheezing or shortness of breath.    Historical Provider, MD  albuterol (PROVENTIL) (2.5 MG/3ML) 0.083% nebulizer solution Take 2.5 mg by nebulization every 6 (six) hours as needed for wheezing or shortness of breath.    Historical Provider, MD  busPIRone (BUSPAR) 15 MG tablet Take 15 mg by mouth 2 (two) times daily. Reported on 07/21/2015    Historical Provider, MD  docusate sodium (COLACE) 100 MG capsule Take 100 mg by mouth 2 (two) times daily.    Historical Provider, MD  ferrous sulfate 325 (65 FE) MG tablet Take 325 mg by mouth daily with breakfast.    Historical Provider, MD  fluticasone (FLONASE) 50 MCG/ACT nasal spray Place 1 spray into  both nostrils daily.    Historical Provider, MD  gabapentin (NEURONTIN) 300 MG capsule Take 300 mg by mouth 3 (three) times daily.    Historical Provider, MD  guaiFENesin (MUCINEX) 600 MG 12 hr tablet Take 1 tablet (600 mg total) by mouth 2 (two) times daily. 07/25/15   Lavina Hamman, MD  hydrOXYzine (ATARAX/VISTARIL) 50 MG tablet Take 50 mg by mouth 3 (three) times daily.    Historical Provider, MD  levofloxacin (LEVAQUIN) 750 MG tablet Take 1 tablet (750 mg total) by mouth daily. 07/25/15 08/01/15  Lavina Hamman, MD  omeprazole (PRILOSEC) 40 MG capsule Take 40 mg by mouth daily.    Historical Provider, MD  PARoxetine  (PAXIL) 20 MG tablet Take 20 mg by mouth daily.    Historical Provider, MD  predniSONE (DELTASONE) 10 MG tablet Take 50mg  daily for 3days,Take 40mg  daily for 3days,Take 30mg  daily for 3days,Take 20mg  daily for 3days,Take 10mg  daily for 3days, then stop 07/25/15   Lavina Hamman, MD  tiotropium (SPIRIVA) 18 MCG inhalation capsule Place 18 mcg into inhaler and inhale daily.    Historical Provider, MD  varenicline (CHANTIX) 1 MG tablet Take 1 mg by mouth daily.    Historical Provider, MD   BP 115/74 mmHg  Pulse 89  Temp(Src) 98.6 F (37 C) (Oral)  Resp 15  SpO2 100% Physical Exam  Constitutional: He is oriented to person, place, and time. He appears well-developed and well-nourished. No distress.  Chronically ill-appearing male, appears much older than stated age, frail and thin, nontoxic in appearance, NAD  HENT:  Head: Normocephalic and atraumatic.  Mouth/Throat: Oropharynx is clear and moist.  Nasal and mucosal edema erythematous with white patches  Eyes: Conjunctivae and EOM are normal. Pupils are equal, round, and reactive to light. Right eye exhibits no discharge. Left eye exhibits no discharge. No scleral icterus.  Neck: Normal range of motion. No JVD present. No tracheal deviation present. No thyromegaly present.  Cardiovascular: Normal rate, regular rhythm, normal heart sounds and intact distal pulses.  Exam reveals no gallop and no friction rub.   No murmur heard. Distant heart sounds with regular rate and rhythm, no murmur, gallop or rub auscultated  Pulmonary/Chest: Effort normal. No respiratory distress. He has no wheezes. He has no rales. He exhibits no tenderness.  Breath sounds are severely diminished throughout all lung fields, no tachypnea, no accessory muscle use  Abdominal: Soft. Bowel sounds are normal. He exhibits no distension and no mass. There is no tenderness. There is no rebound and no guarding.  Musculoskeletal: Normal range of motion. He exhibits no edema or  tenderness.  Lymphadenopathy:    He has no cervical adenopathy.  Neurological: He is alert and oriented to person, place, and time. He has normal reflexes. No cranial nerve deficit. He exhibits normal muscle tone. Coordination normal.  Skin: Skin is warm and dry. No rash noted. He is not diaphoretic. No erythema. No pallor.  Psychiatric: He has a normal mood and affect. His behavior is normal. Judgment and thought content normal.  Nursing note and vitals reviewed.   ED Course  Procedures (including critical care time) Labs Review Labs Reviewed  BASIC METABOLIC PANEL - Abnormal; Notable for the following:    Chloride 97 (*)    Glucose, Bld 142 (*)    All other components within normal limits  CBC - Abnormal; Notable for the following:    WBC 11.9 (*)    Hemoglobin 12.0 (*)    HCT 38.6 (*)  All other components within normal limits  Randolm Idol, ED    Imaging Review Dg Chest 2 View  07/28/2015  CLINICAL DATA:  Shortness of breath. Back pain between the shoulder blades beginning yesterday. Initial encounter. EXAM: CHEST  2 VIEW COMPARISON:  PA and lateral chest 07/26/2015.  CT chest 07/22/2015. FINDINGS: The lungs are severely emphysematous but clear. Heart size is normal. No pneumothorax or pleural effusion. The patient is status post median sternotomy. Surgical clips left upper lobe are noted. IMPRESSION: Severe appearing emphysema without acute disease. Electronically Signed   By: Inge Rise M.D.   On: 07/28/2015 15:16   I have personally reviewed and evaluated these images and lab results as part of my medical decision-making.   EKG Interpretation None      MDM   Chronically ill male with severe emphysema/COPD, presents to the emergency room with chest pain and shortness of breath. These symptoms appear to be consistent over the past week, he had an admission one week ago and was discharged 3 days ago. He returned to the ER 2 days ago for the same complaints and  was discharged home. He returns today with continued complaints of chest pain or shortness of breath, with unfortunate social situation where he was locked out of his home secondary to his nephew being arrested and thrown in jail. He has most of his medications with him however does not have any inhalers and home O2 is locked inside the home.  He called 911 from the porch and was brought to the ER for worsening CP and SOB.  On exam patient is severely diminished, he was given multiple breathing treatments with minimal improvement. He is taking Levaquin and prednisone. Chest x-ray pertinent for severe appearing emphysema without acute disease. The last week with that his admission he had negative cardiac workups, negative echo, negative chest CT, chest pain is likely pleuritic in nature related to his chronic disease.  Troponin was negative, patient has mild leukocytosis, likely secondary to steroid use, he also has chronic anemia, improved from recent lab work.  Pt will likely admission to obs for CP and SOB, do not feel CP work up is absolutely indicated, as his presentation is consistent with recent admission, however given his advanced lung disease and unfortunate social situation, he is not safe to be discharged w/o access to meds and O2.  Dr. Blaine Hamper to admit to tele obs.  Final diagnoses:  COPD exacerbation ([place])  Abdominal pain      Delsa Grana, PA-C 07/31/15 1805  Leonard Schwartz, MD 08/04/15 [id number]

## 2015-07-29 ENCOUNTER — Encounter (HOSPITAL_COMMUNITY): Payer: Self-pay | Admitting: Radiology

## 2015-07-29 ENCOUNTER — Observation Stay (HOSPITAL_COMMUNITY): Payer: Medicare Other

## 2015-07-29 DIAGNOSIS — J439 Emphysema, unspecified: Secondary | ICD-10-CM | POA: Diagnosis not present

## 2015-07-29 DIAGNOSIS — D72829 Elevated white blood cell count, unspecified: Secondary | ICD-10-CM | POA: Diagnosis present

## 2015-07-29 DIAGNOSIS — R2689 Other abnormalities of gait and mobility: Secondary | ICD-10-CM | POA: Diagnosis not present

## 2015-07-29 DIAGNOSIS — J441 Chronic obstructive pulmonary disease with (acute) exacerbation: Secondary | ICD-10-CM | POA: Diagnosis not present

## 2015-07-29 DIAGNOSIS — M6281 Muscle weakness (generalized): Secondary | ICD-10-CM | POA: Diagnosis not present

## 2015-07-29 DIAGNOSIS — R109 Unspecified abdominal pain: Secondary | ICD-10-CM | POA: Diagnosis present

## 2015-07-29 DIAGNOSIS — D649 Anemia, unspecified: Secondary | ICD-10-CM | POA: Diagnosis present

## 2015-07-29 DIAGNOSIS — F329 Major depressive disorder, single episode, unspecified: Secondary | ICD-10-CM | POA: Diagnosis present

## 2015-07-29 DIAGNOSIS — K219 Gastro-esophageal reflux disease without esophagitis: Secondary | ICD-10-CM | POA: Diagnosis present

## 2015-07-29 DIAGNOSIS — R0602 Shortness of breath: Secondary | ICD-10-CM | POA: Diagnosis not present

## 2015-07-29 DIAGNOSIS — R569 Unspecified convulsions: Secondary | ICD-10-CM | POA: Diagnosis not present

## 2015-07-29 DIAGNOSIS — F419 Anxiety disorder, unspecified: Secondary | ICD-10-CM | POA: Diagnosis present

## 2015-07-29 DIAGNOSIS — Z8249 Family history of ischemic heart disease and other diseases of the circulatory system: Secondary | ICD-10-CM | POA: Diagnosis not present

## 2015-07-29 DIAGNOSIS — R488 Other symbolic dysfunctions: Secondary | ICD-10-CM | POA: Diagnosis not present

## 2015-07-29 DIAGNOSIS — R279 Unspecified lack of coordination: Secondary | ICD-10-CM | POA: Diagnosis not present

## 2015-07-29 DIAGNOSIS — Z7952 Long term (current) use of systemic steroids: Secondary | ICD-10-CM | POA: Diagnosis not present

## 2015-07-29 DIAGNOSIS — R000 Tachycardia, unspecified: Secondary | ICD-10-CM | POA: Diagnosis present

## 2015-07-29 DIAGNOSIS — F339 Major depressive disorder, recurrent, unspecified: Secondary | ICD-10-CM | POA: Diagnosis not present

## 2015-07-29 DIAGNOSIS — J9621 Acute and chronic respiratory failure with hypoxia: Secondary | ICD-10-CM | POA: Diagnosis present

## 2015-07-29 DIAGNOSIS — Z720 Tobacco use: Secondary | ICD-10-CM | POA: Diagnosis not present

## 2015-07-29 DIAGNOSIS — Z79899 Other long term (current) drug therapy: Secondary | ICD-10-CM | POA: Diagnosis not present

## 2015-07-29 DIAGNOSIS — T380X5A Adverse effect of glucocorticoids and synthetic analogues, initial encounter: Secondary | ICD-10-CM | POA: Diagnosis present

## 2015-07-29 DIAGNOSIS — I10 Essential (primary) hypertension: Secondary | ICD-10-CM | POA: Diagnosis present

## 2015-07-29 DIAGNOSIS — I639 Cerebral infarction, unspecified: Secondary | ICD-10-CM | POA: Diagnosis not present

## 2015-07-29 DIAGNOSIS — Z8673 Personal history of transient ischemic attack (TIA), and cerebral infarction without residual deficits: Secondary | ICD-10-CM | POA: Diagnosis not present

## 2015-07-29 DIAGNOSIS — Z5189 Encounter for other specified aftercare: Secondary | ICD-10-CM | POA: Diagnosis not present

## 2015-07-29 DIAGNOSIS — G4700 Insomnia, unspecified: Secondary | ICD-10-CM | POA: Diagnosis not present

## 2015-07-29 DIAGNOSIS — Z736 Limitation of activities due to disability: Secondary | ICD-10-CM | POA: Diagnosis not present

## 2015-07-29 DIAGNOSIS — K5900 Constipation, unspecified: Secondary | ICD-10-CM | POA: Diagnosis present

## 2015-07-29 DIAGNOSIS — R079 Chest pain, unspecified: Secondary | ICD-10-CM | POA: Diagnosis not present

## 2015-07-29 DIAGNOSIS — Z9981 Dependence on supplemental oxygen: Secondary | ICD-10-CM | POA: Diagnosis not present

## 2015-07-29 LAB — TROPONIN I
TROPONIN I: 0.04 ng/mL — AB (ref <0.031)
Troponin I: 0.03 ng/mL (ref <0.031)

## 2015-07-29 LAB — LIPID PANEL
CHOLESTEROL: 193 mg/dL (ref 0–200)
HDL: 77 mg/dL (ref >40)
LDL CALC: 106 mg/dL — AB (ref 0–99)
Total CHOL/HDL Ratio: 2.5 RATIO
Triglycerides: 49 mg/dL (ref <150)
VLDL: 10 mg/dL (ref 0–40)

## 2015-07-29 MED ORDER — BARIUM SULFATE 2.1 % PO SUSP
900.0000 mL | ORAL | Status: AC
Start: 2015-07-29 — End: 2015-07-29
  Administered 2015-07-29 (×2): 900 mL via ORAL

## 2015-07-29 MED ORDER — ALBUTEROL SULFATE (2.5 MG/3ML) 0.083% IN NEBU
INHALATION_SOLUTION | RESPIRATORY_TRACT | Status: AC
  Filled 2015-07-29: qty 3

## 2015-07-29 MED ORDER — PRO-STAT SUGAR FREE PO LIQD
30.0000 mL | Freq: Two times a day (BID) | ORAL | Status: DC
  Administered 2015-07-30 – 2015-08-01 (×5): 30 mL via ORAL
  Filled 2015-07-29 (×5): qty 30

## 2015-07-29 MED ORDER — IOPAMIDOL (ISOVUE-300) INJECTION 61%
INTRAVENOUS | Status: AC
  Administered 2015-07-29: 100 mL
  Filled 2015-07-29: qty 100

## 2015-07-29 MED ORDER — ALBUTEROL SULFATE (2.5 MG/3ML) 0.083% IN NEBU
2.5000 mg | INHALATION_SOLUTION | RESPIRATORY_TRACT | Status: DC | PRN
  Administered 2015-07-29 – 2015-07-31 (×4): 2.5 mg via RESPIRATORY_TRACT
  Filled 2015-07-29 (×3): qty 3

## 2015-07-29 MED ORDER — BARIUM SULFATE 2.1 % PO SUSP
ORAL | Status: AC
  Filled 2015-07-29: qty 2

## 2015-07-29 NOTE — Progress Notes (Signed)
PROGRESS NOTE                                                                                                                                                                                                             Patient Demographics:    Dustin Price, is a 65 y.o. male, DOB - Mar 21, 1951, [id number]  Admit date - 07/28/2015   Admitting Physician Ivor Costa, MD  Outpatient Primary MD for the patient is No PCP Per Patient  LOS -   Outpatient Specialists:   Chief Complaint  Patient presents with  . Chest Pain  . Shortness of Breath       Brief Narrative      Subjective:    [name] today has, No headache, No chest pain, No abdominal pain - No Nausea, No new weakness tingling or numbness, No Cough - Improved SOB.    Assessment  & Plan :      1. Acute on chronic respiratory failure with hypoxia due to COPD exacerbation/severe emphysema - No wheezing as emphysema is the main component, agree with IV steroids, Levaquin, Nebs, O2 as needed. CTA & recent FLU PCR negative, doubt PNA, follow cultures.  2. Musculoskeletal and reproducible chest pain from coughing. EKG unremarkable, 1 falsely positive troponin amongst 3 sets, supportive care. Placed on ASA.  3. Smoking. Counseled to quit. On Chantix.  4.GERD - PPI  5. Abd Pain - exam unremarkable, likely musculoskeletal from coughing, CT scan ordered upon admission will be completed and monitored. Continue bowel regimen for constipation.   Code Status : Full  Family Communication  : none  Disposition Plan  : home 1-2 days  Barriers For Discharge : COPD exacerbation  Consults  :  None  Procedures  :    DVT Prophylaxis  :  Lovenox    Lab Results  Component Value Date   PLT 391 07/28/2015    Antibiotics  :     Anti-infectives    Start     Dose/Rate Route Frequency Ordered Stop   07/28/15 2000  levofloxacin (LEVAQUIN) tablet 750 mg     750  mg Oral Daily 07/28/15 1954          Objective:   Filed Vitals:   07/29/15 0725 07/29/15 0846 07/29/15 1058 07/29/15 1059  BP:  113/70    Pulse:  81    Temp:  98.4 F (36.9 C)    TempSrc:  Oral    Resp:  16    Height:      Weight:      SpO2: 100% 100% 100% 100%    Wt Readings from Last 3 Encounters:  07/28/15 64 kg (141 lb 1.5 oz)  07/24/15 66 kg (145 lb 8.1 oz)     Intake/Output Summary (Last 24 hours) at 07/29/15 1221 Last data filed at 07/29/15 0855  Gross per 24 hour  Intake    240 ml  Output    301 ml  Net    -61 ml     Physical Exam  Awake Alert, Oriented X 3, No new F.N deficits, Normal affect [name].AT,PERRAL Supple Neck,No JVD, No cervical lymphadenopathy appriciated.  Symmetrical Chest wall movement, Good air movement bilaterally,minimal wheezing RRR,No Gallops,Rubs or new Murmurs, No Parasternal Heave +ve B.Sounds, Abd Soft, No tenderness, No organomegaly appriciated, No rebound - guarding or rigidity. No Cyanosis, Clubbing or edema, No new Rash or bruise       Data Review:    CBC  Recent Labs Lab 07/23/15 0415 07/24/15 0554 07/25/15 0341 07/26/15 1510 07/28/15 1433  WBC 12.3* 11.6* 11.2* 11.6* 11.9*  HGB 10.3* 9.9* 10.1* 9.8* 12.0*  HCT 31.9* 30.8* 31.4* 32.4* 38.6*  PLT 237 246 246 270 391  MCV 88.4 88.3 88.7 89.0 89.1  MCH 28.5 28.4 28.5 26.9 27.7  MCHC 32.3 32.1 32.2 30.2 31.1  RDW 14.6 14.8 14.8 14.8 15.0  LYMPHSABS 0.4* 1.6 1.9 1.6  --   MONOABS 0.2 1.0 1.0 1.0  --   EOSABS 0.0 0.0 0.0 0.0  --   BASOSABS 0.0 0.0 0.0 0.0  --     Chemistries   Recent Labs Lab 07/23/15 0415 07/24/15 0554 07/25/15 0341 07/26/15 1510 07/28/15 1433  NA 139 138 138 137 138  K 4.2 3.3* 3.8 3.4* 4.5  CL 101 99* 99* 99* 97*  CO2 29 29 27 28 29   GLUCOSE 128* 113* 87 106* 142*  BUN 16 18 18 15 13   CREATININE 0.82 1.01 0.81 0.97 0.90  CALCIUM 8.6* 8.6* 8.4* 8.7* 9.4  MG 2.0 2.1 2.2  --   --   AST 15 16 19 18   --   ALT 25 25 28 28   --   ALKPHOS  63 53 61 54  --   BILITOT 0.4 0.5 0.5 0.4  --    ------------------------------------------------------------------------------------------------------------------  Recent Labs  07/29/15 0229  CHOL 193  HDL 77  LDLCALC 106*  TRIG 49  CHOLHDL 2.5    No results found for: HGBA1C ------------------------------------------------------------------------------------------------------------------ No results for input(s): TSH, T4TOTAL, T3FREE, THYROIDAB in the last 72 hours.  Invalid input(s): FREET3 ------------------------------------------------------------------------------------------------------------------ No results for input(s): VITAMINB12, FOLATE, FERRITIN, TIBC, IRON, RETICCTPCT in the last 72 hours.  Coagulation profile  Recent Labs Lab 07/28/15 2140  INR 1.05    No results for input(s): DDIMER in the last 72 hours.  Cardiac Enzymes  Recent Labs Lab 07/28/15 2140 07/29/15 0229 07/29/15 0803  TROPONINI 0.03 0.04* 0.03   ------------------------------------------------------------------------------------------------------------------    Component Value Date/Time   BNP 36.5 07/26/2015 1510    Inpatient Medications  Scheduled Meds: . antiseptic oral rinse  7 mL Mouth Rinse BID  . aspirin EC  81 mg Oral Daily  . busPIRone  15 mg Oral BID  . docusate sodium  100 mg Oral BID  . enoxaparin (LOVENOX) injection  40 mg Subcutaneous QHS  .  ferrous sulfate  325 mg Oral Q breakfast  . fluticasone  1 spray Each Nare Daily  . gabapentin  300 mg Oral TID  . guaiFENesin  600 mg Oral BID  . hydrOXYzine  50 mg Oral TID  . ipratropium-albuterol  3 mL Nebulization QID  . levofloxacin  750 mg Oral Daily  . methylPREDNISolone (SOLU-MEDROL) injection  60 mg Intravenous Q12H  . pantoprazole  40 mg Oral Daily  . PARoxetine  20 mg Oral Daily  . polyethylene glycol  17 g Oral Daily  . tiotropium  18 mcg Inhalation Daily  . varenicline  1 mg Oral Daily   Continuous  Infusions: . sodium chloride 100 mL/hr at 07/29/15 1032   PRN Meds:.cyclobenzaprine, morphine injection, ondansetron, oxyCODONE-acetaminophen  Micro Results Recent Results (from the past 240 hour(s))  Culture, blood (routine x 2) Call MD if unable to obtain prior to antibiotics being given     Status: None   Collection Time: 07/22/15 12:00 AM  Result Value Ref Range Status   Specimen Description BLOOD LEFT ANTECUBITAL  Final   Special Requests BOTTLES DRAWN AEROBIC AND ANAEROBIC 5CC  Final   Culture NO GROWTH 5 DAYS  Final   Report Status 07/27/2015 FINAL  Final  Culture, blood (routine x 2) Call MD if unable to obtain prior to antibiotics being given     Status: None   Collection Time: 07/22/15 12:05 AM  Result Value Ref Range Status   Specimen Description BLOOD LEFT ARM  Final   Special Requests BOTTLES DRAWN AEROBIC AND ANAEROBIC 5CC  Final   Culture NO GROWTH 5 DAYS  Final   Report Status 07/27/2015 FINAL  Final  MRSA PCR Screening     Status: None   Collection Time: 07/22/15  1:49 AM  Result Value Ref Range Status   MRSA by PCR NEGATIVE NEGATIVE Final    Comment:        The GeneXpert MRSA Assay (FDA approved for NASAL specimens only), is one component of a comprehensive MRSA colonization surveillance program. It is not intended to diagnose MRSA infection nor to guide or monitor treatment for MRSA infections.   Culture, sputum-assessment     Status: None   Collection Time: 07/22/15  8:23 PM  Result Value Ref Range Status   Specimen Description SPUTUM  Final   Special Requests NONE  Final   Sputum evaluation   Final    THIS SPECIMEN IS ACCEPTABLE. RESPIRATORY CULTURE REPORT TO FOLLOW.   Report Status 07/22/2015 FINAL  Final  Culture, respiratory (NON-Expectorated)     Status: None   Collection Time: 07/22/15 11:20 PM  Result Value Ref Range Status   Specimen Description EXPECTORATED SPUTUM  Final   Special Requests NONE  Final   Gram Stain   Final    ABUNDANT WBC  PRESENT, PREDOMINANTLY PMN RARE SQUAMOUS EPITHELIAL CELLS PRESENT MODERATE GRAM NEGATIVE RODS MODERATE GRAM POSITIVE RODS RARE GRAM POSITIVE COCCI IN PAIRS THIS SPECIMEN IS ACCEPTABLE FOR SPUTUM CULTURE Performed at [facility]    Culture   Final    MODERATE PSEUDOMONAS AERUGINOSA Performed at [facility]    Report Status 07/25/2015 FINAL  Final   Organism ID, Bacteria PSEUDOMONAS AERUGINOSA  Final      Susceptibility   Pseudomonas aeruginosa - MIC*    CEFEPIME <=1 SENSITIVE Sensitive     CEFTAZIDIME 4 SENSITIVE Sensitive     CIPROFLOXACIN <=0.25 SENSITIVE Sensitive     GENTAMICIN <=1 SENSITIVE Sensitive  IMIPENEM 1 SENSITIVE Sensitive     PIP/TAZO <=4 SENSITIVE Sensitive     TOBRAMYCIN <=1 SENSITIVE Sensitive     * MODERATE PSEUDOMONAS AERUGINOSA  Culture, blood (routine x 2) Call MD if unable to obtain prior to antibiotics being given     Status: None (Preliminary result)   Collection Time: 07/28/15 10:27 PM  Result Value Ref Range Status   Specimen Description BLOOD BLOOD RIGHT FOREARM  Final   Special Requests BOTTLES DRAWN AEROBIC AND ANAEROBIC 5CC EACH  Final   Culture PENDING  Incomplete   Report Status PENDING  Incomplete  Culture, blood (routine x 2) Call MD if unable to obtain prior to antibiotics being given     Status: None (Preliminary result)   Collection Time: 07/28/15 10:29 PM  Result Value Ref Range Status   Specimen Description BLOOD LEFT ARM  Final   Special Requests BOTTLES DRAWN AEROBIC AND ANAEROBIC 5CC EACH  Final   Culture PENDING  Incomplete   Report Status PENDING  Incomplete    Radiology Reports Dg Chest 2 View  07/28/2015  CLINICAL DATA:  Shortness of breath. Back pain between the shoulder blades beginning yesterday. Initial encounter. EXAM: CHEST  2 VIEW COMPARISON:  PA and lateral chest 07/26/2015.  CT chest 07/22/2015. FINDINGS: The lungs are severely emphysematous but clear. Heart size is normal. No pneumothorax or pleural  effusion. The patient is status post median sternotomy. Surgical clips left upper lobe are noted. IMPRESSION: Severe appearing emphysema without acute disease. Electronically Signed   By: Inge Rise M.D.   On: 07/28/2015 15:16   Dg Chest 2 View  07/26/2015  CLINICAL DATA:  Shortness of breath for 1 day.  Initial encounter. EXAM: CHEST  2 VIEW COMPARISON:  CT chest 07/22/2015.  PA and lateral chest 07/21/2015. FINDINGS: The lungs are severely emphysematous but clear. Heart size is normal. No pneumothorax or pleural effusion. The patient is status post CABG. IMPRESSION: Emphysema without acute disease. Electronically Signed   By: Inge Rise M.D.   On: 07/26/2015 16:00   Dg Chest 2 View  07/21/2015  CLINICAL DATA:  Chest pain and shortness of breath EXAM: CHEST  2 VIEW COMPARISON:  July 06, 2015 FINDINGS: There is underlying emphysematous change bilaterally, stable. There are stable scattered areas of scarring bilaterally. There is no edema or consolidation. The heart size is within normal limits. The pulmonary vascularity is stable and reflects underlying emphysema. Patient is status post median sternotomy. No adenopathy. No bone lesions. IMPRESSION: Underlying emphysematous change with scattered areas of scarring. No edema or consolidation. Stable cardiac silhouette. Electronically Signed   By: Lowella Grip III M.D.   On: 07/21/2015 22:03   Ct Angio Chest Pe W/cm &/or Wo Cm  07/22/2015  CLINICAL DATA:  Chest pain for 3 days. EXAM: CT ANGIOGRAPHY CHEST WITH CONTRAST TECHNIQUE: Multidetector CT imaging of the chest was performed using the standard protocol during bolus administration of intravenous contrast. Multiplanar CT image reconstructions and MIPs were obtained to evaluate the vascular anatomy. CONTRAST:  100 mL Isovue 370 intravenous COMPARISON:  Radiographs 07/21/2015, CT 08/07/2014 FINDINGS: Cardiovascular: There is good opacification of the pulmonary arteries. There is no pulmonary  embolism. The thoracic aorta is normal in caliber and intact. Lungs: Severe bullous emphysematous disease bilaterally. No confluent consolidation. No masses or suspicious nodules. No pneumothorax. Central airways: Retained secretions or aspiration in the right mainstem bronchus and right lower lobe bronchus. Effusions: None Lymphadenopathy: None Esophagus: Unremarkable Upper abdomen: Right renal cyst, unchanged.  Musculoskeletal: No significant abnormality. Review of the MIP images confirms the above findings. IMPRESSION: 1. Negative for acute pulmonary embolism. 2. Severe bullous emphysema. 3. Retained secretions or aspiration in the right mainstem bronchus and right lower lobe bronchus. Electronically Signed   By: Andreas Newport M.D.   On: 07/22/2015 02:31    Time Spent in minutes  30   Lorcan Shelp K M.D on 07/29/2015 at 12:21 PM  Between 7am to 7pm - Pager - [phone]  After 7pm go to [email or web address] - password [facility]  [facility] -  Office  [phone]

## 2015-07-29 NOTE — Progress Notes (Signed)
Patient reported some SOB- RN will give PRN breathing treatment.

## 2015-07-29 NOTE — Progress Notes (Signed)
Initial Nutrition Assessment  DOCUMENTATION CODES:   Non-severe (moderate) malnutrition in context of chronic illness  INTERVENTION:  Once diet advances, provide 30 ml Prostat po BID, each supplement provides 100 kcal and 15 grams of protein.   NUTRITION DIAGNOSIS:   Malnutrition related to chronic illness as evidenced by moderate depletions of muscle mass, moderate depletion of body fat.  GOAL:   Patient will meet greater than or equal to 90% of their needs  MONITOR:   Diet advancement, Supplement acceptance, Weight trends, I & O's, Labs  REASON FOR ASSESSMENT:   Consult Assessment of nutrition requirement/status  ASSESSMENT:   65 y.o. 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  Pt is currently NPO for procedure today. Pt reports hunger and says he has no PO intake over the past 2 day. He reports usually eating well PTA with consumption of at least 3 meals, however per MD note pt reports having poor appetite. No family at bedside. Usual body weight reported to be ~145 lbs. Pt with 2.7% weight loss in 1 week, check accuracy of current weight? RD to order nutritional supplements to aid in caloric and protein needs. Noted pt refused Ensure/Boost. Will order Prostat. Nursing staff to provide once diet advances.   Nutrition-Focused physical exam completed. Findings are moderate fat depletion, moderate muscle depletion, and no edema.   Labs and medications reviewed.   Diet Order:  Diet NPO time specified Except for: Sips with Meds  Skin:  Reviewed, no issues  Last BM:  4/20  Height:   Ht Readings from Last 1 Encounters:  07/28/15 5\' 4"  (1.626 m)    Weight:   Wt Readings from Last 1 Encounters:  07/28/15 141 lb 1.5 oz (64 kg)    Ideal Body Weight:  59 kg  BMI:  Body mass index is  24.21 kg/(m^2).  Estimated Nutritional Needs:   Kcal:  1800-2000  Protein:  85-95 grams  Fluid:  1.8 - 2 L/day  EDUCATION NEEDS:   No education needs identified at this time  Corrin Parker, MS, RD, LDN Pager # [phone] After hours/ weekend pager # [phone]

## 2015-07-29 NOTE — Care Management Obs Status (Signed)
[facility] NOTIFICATION   Patient Details  Name: Dustin Price MRN: [id number] Date of Birth: 02/07/1951   Medicare Observation Status Notification Given:  Yes    Maitri Schnoebelen, Rory Percy, RN 07/29/2015, 11:04 AM

## 2015-07-29 NOTE — Evaluation (Signed)
Physical Therapy Evaluation Patient Details Name: DELLA SZAKACS MRN: [id number] DOB: Aug 15, 1950 Today's Date: 07/29/2015   History of Present Illness  65 y.o. male admitted to [facility] on 07/28/15 with SOB, chest pain, and abdominal pain.  Dx with acute on chronic respiratory failure with hypoxia due to COPD exacerbation/severe emphysema, chest pain (presumed to be musculoskeletal), and abdominal pain (throught to be due to consitpation, but undergoing CT of abdomen with contrast 07/29/15).  Pt with recent hospitilization on 4/13-4/17/17 for COPD exacerbation and chest pain.  Clinical Impression  Pt has extremely limited activity tolerance and now his primary caregiver in no longer home to help him.  His sister brings foot by daily.  I do not believe the pt is managing well at home on his own (given this is his second admission this month).  SNF for rehab would be his best, safest option if he can qualify, otherwise max HH services at discharge including PT/OT/Aide.   PT to follow acutely for deficits listed below.       Follow Up Recommendations SNF (if he qualifies)    Equipment Recommendations  None recommended by PT    Recommendations for Other Services   NA    Precautions / Restrictions Precautions Precautions: Fall;Other (comment) Precaution Comments: fall due to impulsivity, monitor O2 sats and dyspnea levels with gait.      Mobility  Bed Mobility Overal bed mobility: Modified Independent             General bed mobility comments: HOB elevated pt using bed rail for trunk support  Transfers Overall transfer level: Modified independent Equipment used: Rolling walker (2 wheeled)             General transfer comment: use of hands for transitions  Ambulation/Gait Ambulation/Gait assistance: Min guard Ambulation Distance (Feet): 60 Feet Assistive device: Rolling walker (2 wheeled) Gait Pattern/deviations: Step-through pattern Gait velocity: too fast to be safe    General Gait Details: Used RW thinking it would help his endurance, however, pt not using RW safely and gait speed on the return trip back to the room too fast to be safe with line management and RW.  Pt with 3-/4 DOE on 3 L O2 [place] with gait.  O2 sats 98% despite DOE when back in room.           Balance Overall balance assessment: No apparent balance deficits (not formally assessed)                                           Pertinent Vitals/Pain Pain Assessment: No/denies pain    Home Living Family/patient expects to be discharged to:: Private residence Living Arrangements: Alone (lived with nephew who is now incarcerated) Available Help at Discharge: Family;Other (Comment);Personal care attendant (male relative brings three meals a day) Type of Home: House Home Access: Stairs to enter Entrance Stairs-Rails: Right;Left;Can reach both Entrance Stairs-Number of Steps: 3 Home Layout: One level Home Equipment:  (home O2 3 L [place]) Additional Comments: Has RN And possibly aide/HHPT active?    Prior Function Level of Independence: Needs assistance   Gait / Transfers Assistance Needed: walks independently, limited activity tolerance at baseline  ADL's / Homemaking Assistance Needed: Does not cook or go out for groceries           Extremity/Trunk Assessment   Upper Extremity Assessment: Defer to OT evaluation  Lower Extremity Assessment: Generalized weakness      Cervical / Trunk Assessment: Normal  Communication   Communication: No difficulties  Cognition Arousal/Alertness: Awake/alert Behavior During Therapy: Anxious Overall Cognitive Status: Within Functional Limits for tasks assessed                               Assessment/Plan    PT Assessment Patient needs continued PT services  PT Diagnosis Difficulty walking;Generalized weakness   PT Problem List Decreased activity tolerance;Decreased mobility;Decreased knowledge  of use of DME;Cardiopulmonary status limiting activity  PT Treatment Interventions DME instruction;Gait training;Functional mobility training;Therapeutic activities;Therapeutic exercise;Balance training;Neuromuscular re-education;Patient/family education   PT Goals (Current goals can be found in the Care Plan section) Acute Rehab PT Goals Patient Stated Goal: he wants to move into residential SNF PT Goal Formulation: With patient Time For Goal Achievement: 08/12/15 Potential to Achieve Goals: Good    Frequency Min 3X/week           End of Session Equipment Utilized During Treatment: Oxygen;Gait belt Activity Tolerance: Other (comment) (limited by significant DOE with activity) Patient left: in bed;with call bell/phone within reach      Functional Assessment Tool Used: assist level Functional Limitation: Mobility: Walking and moving around Mobility: Walking and Moving Around Current Status [phone]): At least 1 percent but less than 20 percent impaired, limited or restricted Mobility: Walking and Moving Around Goal Status [phone]): At least 1 percent but less than 20 percent impaired, limited or restricted    Time: [id number] PT Time Calculation (min) (ACUTE ONLY): 19 min   Charges:   PT Evaluation $PT Eval Moderate Complexity: 1 Procedure     PT G Codes:   PT G-Codes **NOT FOR INPATIENT CLASS** Functional Assessment Tool Used: assist level Functional Limitation: Mobility: Walking and moving around Mobility: Walking and Moving Around Current Status [id number]): At least 1 percent but less than 20 percent impaired, limited or restricted Mobility: Walking and Moving Around Goal Status [phone]): At least 1 percent but less than 20 percent impaired, limited or restricted    Takiesha Mcdevitt B. Lawson, [place], DPT [phone]   07/29/2015, 12:27 PM

## 2015-07-30 LAB — HEMOGLOBIN A1C
Hgb A1c MFr Bld: 6.1 % — ABNORMAL HIGH (ref 4.8–5.6)
Mean Plasma Glucose: 128 mg/dL

## 2015-07-30 NOTE — Progress Notes (Signed)
PROGRESS NOTE                                                                                                                                                                                                             Patient Demographics:    Dustin Price, is a 65 y.o. male, DOB - 11/08/1950, [id number]  Admit date - 07/28/2015   Admitting Physician Dustin Costa, MD  Outpatient Primary MD for the patient is No PCP Per Patient  LOS - 1  Outpatient Specialists:   Chief Complaint  Patient presents with  . Chest Pain  . Shortness of Breath       Brief Narrative      Subjective:    Dustin Price today has, No headache, No chest pain, No abdominal pain - No Nausea, No new weakness tingling or numbness, No Cough - Improved SOB.    Assessment  & Plan :      1. Acute on chronic respiratory failure with hypoxia due to COPD exacerbation/severe emphysema - No wheezing as emphysema is the main component, agree with IV steroids, Levaquin, Nebs, O2 as needed he does use 3Lit [place] at home. CTA & recent FLU PCR negative, doubt PNA, follow cultures. Clinically better, likely DC to SNF soon.  2. Musculoskeletal and reproducible chest pain from coughing. EKG unremarkable, 1 falsely positive troponin amongst 3 sets, supportive care. Placed on ASA.  3. Smoking. Counseled to quit. On Chantix.  4.GERD - PPI  5. Abd Pain - exam unremarkable, likely musculoskeletal from coughing, CT scan ordered upon admission was stable. Continue bowel regimen for constipation.  6. Gen weakness - PT, placement.   Code Status : Full  Family Communication  : none  Disposition Plan  : likely SNF  Barriers For Discharge : COPD exacerbation  Consults  :  None  Procedures  :    DVT Prophylaxis  :  Lovenox    Lab Results  Component Value Date   PLT 391 07/28/2015    Antibiotics  :     Anti-infectives    Start     Dose/Rate Route  Frequency Ordered Stop   07/28/15 2000  levofloxacin (LEVAQUIN) tablet 750 mg     750 mg Oral Daily 07/28/15 1954          Objective:   Filed Vitals:  07/29/15 1849 07/29/15 2047 07/29/15 2100 07/30/15 0411  BP: 127/64  99/59 115/79  Pulse: 84  93 89  Temp: 98 F (36.7 C)  98.6 F (37 C) 98.1 F (36.7 C)  TempSrc: Oral  Oral Oral  Resp: 16  16 18   Height:      Weight:      SpO2: 100% 100% 100% 100%    Wt Readings from Last 3 Encounters:  07/28/15 64 kg (141 lb 1.5 oz)  07/24/15 66 kg (145 lb 8.1 oz)     Intake/Output Summary (Last 24 hours) at 07/30/15 0846 Last data filed at 07/30/15 0747  Gross per 24 hour  Intake   1040 ml  Output   2003 ml  Net   -963 ml     Physical Exam  Awake Alert, Oriented X 3, No new F.N deficits, Normal affect [place].AT,PERRAL Supple Neck,No JVD, No cervical lymphadenopathy appriciated.  Symmetrical Chest wall movement, Good air movement bilaterally,minimal wheezing RRR,No Gallops,Rubs or new Murmurs, No Parasternal Heave +ve B.Sounds, Abd Soft, No tenderness, No organomegaly appriciated, No rebound - guarding or rigidity. No Cyanosis, Clubbing or edema, No new Rash or bruise       Data Review:    CBC  Recent Labs Lab 07/24/15 0554 07/25/15 0341 07/26/15 1510 07/28/15 1433  WBC 11.6* 11.2* 11.6* 11.9*  HGB 9.9* 10.1* 9.8* 12.0*  HCT 30.8* 31.4* 32.4* 38.6*  PLT 246 246 270 391  MCV 88.3 88.7 89.0 89.1  MCH 28.4 28.5 26.9 27.7  MCHC 32.1 32.2 30.2 31.1  RDW 14.8 14.8 14.8 15.0  LYMPHSABS 1.6 1.9 1.6  --   MONOABS 1.0 1.0 1.0  --   EOSABS 0.0 0.0 0.0  --   BASOSABS 0.0 0.0 0.0  --     Chemistries   Recent Labs Lab 07/24/15 0554 07/25/15 0341 07/26/15 1510 07/28/15 1433  NA 138 138 137 138  K 3.3* 3.8 3.4* 4.5  CL 99* 99* 99* 97*  CO2 29 27 28 29   GLUCOSE 113* 87 106* 142*  BUN 18 18 15 13   CREATININE 1.01 0.81 0.97 0.90  CALCIUM 8.6* 8.4* 8.7* 9.4  MG 2.1 2.2  --   --   AST 16 19 18   --   ALT 25 28 28    --   ALKPHOS 53 61 54  --   BILITOT 0.5 0.5 0.4  --    ------------------------------------------------------------------------------------------------------------------  Recent Labs  07/29/15 0229  CHOL 193  HDL 77  LDLCALC 106*  TRIG 49  CHOLHDL 2.5    Lab Results  Component Value Date   HGBA1C 6.1* 07/29/2015   ------------------------------------------------------------------------------------------------------------------ No results for input(s): TSH, T4TOTAL, T3FREE, THYROIDAB in the last 72 hours.  Invalid input(s): FREET3 ------------------------------------------------------------------------------------------------------------------ No results for input(s): VITAMINB12, FOLATE, FERRITIN, TIBC, IRON, RETICCTPCT in the last 72 hours.  Coagulation profile  Recent Labs Lab 07/28/15 2140  INR 1.05    No results for input(s): DDIMER in the last 72 hours.  Cardiac Enzymes  Recent Labs Lab 07/28/15 2140 07/29/15 0229 07/29/15 0803  TROPONINI 0.03 0.04* 0.03   ------------------------------------------------------------------------------------------------------------------    Component Value Date/Time   BNP 36.5 07/26/2015 1510    Inpatient Medications  Scheduled Meds: . albuterol      . antiseptic oral rinse  7 mL Mouth Rinse BID  . aspirin EC  81 mg Oral Daily  . busPIRone  15 mg Oral BID  . docusate sodium  100 mg Oral BID  . enoxaparin (LOVENOX) injection  40 mg Subcutaneous QHS  . feeding supplement (PRO-STAT SUGAR FREE 64)  30 mL Oral BID  . ferrous sulfate  325 mg Oral Q breakfast  . fluticasone  1 spray Each Nare Daily  . gabapentin  300 mg Oral TID  . guaiFENesin  600 mg Oral BID  . hydrOXYzine  50 mg Oral TID  . ipratropium-albuterol  3 mL Nebulization QID  . levofloxacin  750 mg Oral Daily  . methylPREDNISolone (SOLU-MEDROL) injection  60 mg Intravenous Q12H  . pantoprazole  40 mg Oral Daily  . PARoxetine  20 mg Oral Daily  .  polyethylene glycol  17 g Oral Daily  . tiotropium  18 mcg Inhalation Daily  . varenicline  1 mg Oral Daily   Continuous Infusions: . sodium chloride 100 mL/hr at 07/30/15 0832   PRN Meds:.albuterol, cyclobenzaprine, morphine injection, ondansetron, oxyCODONE-acetaminophen  Micro Results Recent Results (from the past 240 hour(s))  Culture, blood (routine x 2) Call MD if unable to obtain prior to antibiotics being given     Status: None   Collection Time: 07/22/15 12:00 AM  Result Value Ref Range Status   Specimen Description BLOOD LEFT ANTECUBITAL  Final   Special Requests BOTTLES DRAWN AEROBIC AND ANAEROBIC 5CC  Final   Culture NO GROWTH 5 DAYS  Final   Report Status 07/27/2015 FINAL  Final  Culture, blood (routine x 2) Call MD if unable to obtain prior to antibiotics being given     Status: None   Collection Time: 07/22/15 12:05 AM  Result Value Ref Range Status   Specimen Description BLOOD LEFT ARM  Final   Special Requests BOTTLES DRAWN AEROBIC AND ANAEROBIC 5CC  Final   Culture NO GROWTH 5 DAYS  Final   Report Status 07/27/2015 FINAL  Final  MRSA PCR Screening     Status: None   Collection Time: 07/22/15  1:49 AM  Result Value Ref Range Status   MRSA by PCR NEGATIVE NEGATIVE Final    Comment:        The GeneXpert MRSA Assay (FDA approved for NASAL specimens only), is one component of a comprehensive MRSA colonization surveillance program. It is not intended to diagnose MRSA infection nor to guide or monitor treatment for MRSA infections.   Culture, sputum-assessment     Status: None   Collection Time: 07/22/15  8:23 PM  Result Value Ref Range Status   Specimen Description SPUTUM  Final   Special Requests NONE  Final   Sputum evaluation   Final    THIS SPECIMEN IS ACCEPTABLE. RESPIRATORY CULTURE REPORT TO FOLLOW.   Report Status 07/22/2015 FINAL  Final  Culture, respiratory (NON-Expectorated)     Status: None   Collection Time: 07/22/15 11:20 PM  Result Value Ref  Range Status   Specimen Description EXPECTORATED SPUTUM  Final   Special Requests NONE  Final   Gram Stain   Final    ABUNDANT WBC PRESENT, PREDOMINANTLY PMN RARE SQUAMOUS EPITHELIAL CELLS PRESENT MODERATE GRAM NEGATIVE RODS MODERATE GRAM POSITIVE RODS RARE GRAM POSITIVE COCCI IN PAIRS THIS SPECIMEN IS ACCEPTABLE FOR SPUTUM CULTURE Performed at [facility]    Culture   Final    MODERATE PSEUDOMONAS AERUGINOSA Performed at [facility]    Report Status 07/25/2015 FINAL  Final   Organism ID, Bacteria PSEUDOMONAS AERUGINOSA  Final      Susceptibility   Pseudomonas aeruginosa - MIC*    CEFEPIME <=1 SENSITIVE Sensitive     CEFTAZIDIME 4 SENSITIVE Sensitive  CIPROFLOXACIN <=0.25 SENSITIVE Sensitive     GENTAMICIN <=1 SENSITIVE Sensitive     IMIPENEM 1 SENSITIVE Sensitive     PIP/TAZO <=4 SENSITIVE Sensitive     TOBRAMYCIN <=1 SENSITIVE Sensitive     * MODERATE PSEUDOMONAS AERUGINOSA  Culture, blood (routine x 2) Call MD if unable to obtain prior to antibiotics being given     Status: None (Preliminary result)   Collection Time: 07/28/15 10:27 PM  Result Value Ref Range Status   Specimen Description BLOOD BLOOD RIGHT FOREARM  Final   Special Requests BOTTLES DRAWN AEROBIC AND ANAEROBIC 5CC EACH  Final   Culture NO GROWTH < 24 HOURS  Final   Report Status PENDING  Incomplete  Culture, blood (routine x 2) Call MD if unable to obtain prior to antibiotics being given     Status: None (Preliminary result)   Collection Time: 07/28/15 10:29 PM  Result Value Ref Range Status   Specimen Description BLOOD LEFT ARM  Final   Special Requests BOTTLES DRAWN AEROBIC AND ANAEROBIC 5CC   Final   Culture NO GROWTH < 24 HOURS  Final   Report Status PENDING  Incomplete    Radiology Reports Dg Chest 2 View  07/28/2015  CLINICAL DATA:  Shortness of breath. Back pain between the shoulder blades beginning yesterday. Initial encounter. EXAM: CHEST  2 VIEW COMPARISON:  PA and lateral  chest 07/26/2015.  CT chest 07/22/2015. FINDINGS: The lungs are severely emphysematous but clear. Heart size is normal. No pneumothorax or pleural effusion. The patient is status post median sternotomy. Surgical clips left upper lobe are noted. IMPRESSION: Severe appearing emphysema without acute disease. Electronically Signed   By: Inge Rise M.D.   On: 07/28/2015 15:16   Dg Chest 2 View  07/26/2015  CLINICAL DATA:  Shortness of breath for 1 day.  Initial encounter. EXAM: CHEST  2 VIEW COMPARISON:  CT chest 07/22/2015.  PA and lateral chest 07/21/2015. FINDINGS: The lungs are severely emphysematous but clear. Heart size is normal. No pneumothorax or pleural effusion. The patient is status post CABG. IMPRESSION: Emphysema without acute disease. Electronically Signed   By: Inge Rise M.D.   On: 07/26/2015 16:00   Dg Chest 2 View  07/21/2015  CLINICAL DATA:  Chest pain and shortness of breath EXAM: CHEST  2 VIEW COMPARISON:  July 06, 2015 FINDINGS: There is underlying emphysematous change bilaterally, stable. There are stable scattered areas of scarring bilaterally. There is no edema or consolidation. The heart size is within normal limits. The pulmonary vascularity is stable and reflects underlying emphysema. Patient is status post median sternotomy. No adenopathy. No bone lesions. IMPRESSION: Underlying emphysematous change with scattered areas of scarring. No edema or consolidation. Stable cardiac silhouette. Electronically Signed   By: Lowella Grip III M.D.   On: 07/21/2015 22:03   Ct Angio Chest Pe W/cm &/or Wo Cm  07/22/2015  CLINICAL DATA:  Chest pain for 3 days. EXAM: CT ANGIOGRAPHY CHEST WITH CONTRAST TECHNIQUE: Multidetector CT imaging of the chest was performed using the standard protocol during bolus administration of intravenous contrast. Multiplanar CT image reconstructions and MIPs were obtained to evaluate the vascular anatomy. CONTRAST:  100 mL Isovue 370 intravenous  COMPARISON:  Radiographs 07/21/2015, CT 08/07/2014 FINDINGS: Cardiovascular: There is good opacification of the pulmonary arteries. There is no pulmonary embolism. The thoracic aorta is normal in caliber and intact. Lungs: Severe bullous emphysematous disease bilaterally. No confluent consolidation. No masses or suspicious nodules. No pneumothorax. Central airways: Retained secretions  or aspiration in the right mainstem bronchus and right lower lobe bronchus. Effusions: None Lymphadenopathy: None Esophagus: Unremarkable Upper abdomen: Right renal cyst, unchanged. Musculoskeletal: No significant abnormality. Review of the MIP images confirms the above findings. IMPRESSION: 1. Negative for acute pulmonary embolism. 2. Severe bullous emphysema. 3. Retained secretions or aspiration in the right mainstem bronchus and right lower lobe bronchus. Electronically Signed   By: Andreas Newport M.D.   On: 07/22/2015 02:31   Ct Abdomen Pelvis W Contrast  07/29/2015  CLINICAL DATA:  Mid abdominal pain for 1 year.  Abdominal mass. EXAM: CT ABDOMEN AND PELVIS WITH CONTRAST TECHNIQUE: Multidetector CT imaging of the abdomen and pelvis was performed using the standard protocol following bolus administration of intravenous contrast. CONTRAST:  182mL ISOVUE-300 IOPAMIDOL (ISOVUE-300) INJECTION 61% COMPARISON:  01/25/2015 FINDINGS: There is advanced emphysema at the lung bases with associated posterior basilar scarring. Mild diffuse hepatic steatosis Gallbladder, spleen, and pancreas are within normal limits. Small left adrenal nodule is stable on image 27. Right adrenal gland is normal. 4.7 cm complex cystic lesion in the right kidney is stable. Terminal ileum and appendix are unremarkable. Large bowel is distended with air-fluid levels. Descending and sigmoid colon are relatively decompressed, but there is no focal mass or stricture to suggest a transition point cause. Prostate is mildly enlarged. There is fluid in the distal  right inguinal ring of unknown significance. No free-fluid.  No abnormal retroperitoneal adenopathy. There is gas within the subcutaneous fat of the right lower quadrant likely from a recent injection. IMPRESSION: No acute intra-abdominal or intrapelvic pathology. Chronic changes are noted. Electronically Signed   By: Marybelle Killings M.D.   On: 07/29/2015 14:09    Time Spent in minutes  30   Jaun Galluzzo K M.D on 07/30/2015 at 8:46 AM  Between 7am to 7pm - Pager - [phone]  After 7pm go to [email or web address] - password [facility]  [facility] -  Office  [phone]

## 2015-07-30 NOTE — Progress Notes (Signed)
Patient refusing to get up in chair at this time.  Will attempt again later.  Incentive spirometer given to patient.  Patient stated he has one at home. Patient instructed on how to use.

## 2015-07-31 MED ORDER — PREDNISONE 50 MG PO TABS
50.0000 mg | ORAL_TABLET | Freq: Every day | ORAL | Status: DC
  Administered 2015-08-01: 50 mg via ORAL
  Filled 2015-07-31: qty 1

## 2015-07-31 MED ORDER — FUROSEMIDE 10 MG/ML IJ SOLN
20.0000 mg | Freq: Once | INTRAMUSCULAR | Status: AC
  Administered 2015-07-31: 20 mg via INTRAVENOUS
  Filled 2015-07-31: qty 2

## 2015-07-31 NOTE — Progress Notes (Signed)
PROGRESS NOTE                                                                                                                                                                                                             Patient Demographics:    Dustin Price, is a 65 y.o. male, DOB - 03-22-1951, [id number]  Admit date - 07/28/2015   Admitting Physician Ivor Costa, MD  Outpatient Primary MD for the patient is No PCP Per Patient  LOS - 2  Outpatient Specialists:   Chief Complaint  Patient presents with  . Chest Pain  . Shortness of Breath       Brief Narrative      Subjective:    [name] today has, No headache, No chest pain, No abdominal pain - No Nausea, No new weakness tingling or numbness, No Cough - Improved SOB.    Assessment  & Plan :      1. Acute on chronic respiratory failure with hypoxia due to COPD exacerbation/severe emphysema - No wheezing as emphysema is the main component, much improved will switch to Po steroids & PO Levaquin, Nebs, O2 as needed he does use 3Lit [place] at home. CTA & recent FLU PCR negative, doubt PNA, follow cultures. Clinically better, likely DC to SNF in am.  2. Musculoskeletal and reproducible chest pain from coughing. EKG unremarkable, 1 falsely positive troponin amongst 3 sets, supportive care. Placed on ASA.  3. Smoking. Counseled to quit. On Chantix.  4.GERD - PPI  5. Abd Pain - exam unremarkable, likely musculoskeletal from coughing, CT scan ordered upon admission was stable. Continue bowel regimen for constipation.  6. Gen weakness - PT, placement.   Code Status : Full  Family Communication  : none  Disposition Plan  : likely SNF in am  Barriers For Discharge : COPD exacerbation  Consults  :  None  Procedures  :    DVT Prophylaxis  :  Lovenox    Lab Results  Component Value Date   PLT 391 07/28/2015    Antibiotics  :     Anti-infectives     Start     Dose/Rate Route Frequency Ordered Stop   07/28/15 2000  levofloxacin (LEVAQUIN) tablet 750 mg     750 mg Oral Daily 07/28/15 1954  Objective:   Filed Vitals:   07/30/15 2035 07/31/15 0523 07/31/15 0816 07/31/15 0830  BP:  143/78  143/72  Pulse: 90 86  83  Temp:  97.7 F (36.5 C)  98.5 F (36.9 C)  TempSrc:  Oral  Oral  Resp: 18 18  20   Height:      Weight:      SpO2: 99% 100% 99% 100%    Wt Readings from Last 3 Encounters:  07/28/15 64 kg (141 lb 1.5 oz)  07/24/15 66 kg (145 lb 8.1 oz)     Intake/Output Summary (Last 24 hours) at 07/31/15 0950 Last data filed at 07/31/15 0656  Gross per 24 hour  Intake 3028.33 ml  Output   1800 ml  Net 1228.33 ml     Physical Exam  Awake Alert, Oriented X 3, No new F.N deficits, Normal affect [place].AT,PERRAL Supple Neck,No JVD, No cervical lymphadenopathy appriciated.  Symmetrical Chest wall movement, Good air movement bilaterally,minimal wheezing RRR,No Gallops,Rubs or new Murmurs, No Parasternal Heave +ve B.Sounds, Abd Soft, No tenderness, No organomegaly appriciated, No rebound - guarding or rigidity. No Cyanosis, Clubbing or edema, No new Rash or bruise       Data Review:    CBC  Recent Labs Lab 07/25/15 0341 07/26/15 1510 07/28/15 1433  WBC 11.2* 11.6* 11.9*  HGB 10.1* 9.8* 12.0*  HCT 31.4* 32.4* 38.6*  PLT 246 270 391  MCV 88.7 89.0 89.1  MCH 28.5 26.9 27.7  MCHC 32.2 30.2 31.1  RDW 14.8 14.8 15.0  LYMPHSABS 1.9 1.6  --   MONOABS 1.0 1.0  --   EOSABS 0.0 0.0  --   BASOSABS 0.0 0.0  --     Chemistries   Recent Labs Lab 07/25/15 0341 07/26/15 1510 07/28/15 1433  NA 138 137 138  K 3.8 3.4* 4.5  CL 99* 99* 97*  CO2 27 28 29   GLUCOSE 87 106* 142*  BUN 18 15 13   CREATININE 0.81 0.97 0.90  CALCIUM 8.4* 8.7* 9.4  MG 2.2  --   --   AST 19 18  --   ALT 28 28  --   ALKPHOS 61 54  --   BILITOT 0.5 0.4  --     ------------------------------------------------------------------------------------------------------------------  Recent Labs  07/29/15 0229  CHOL 193  HDL 77  LDLCALC 106*  TRIG 49  CHOLHDL 2.5    Lab Results  Component Value Date   HGBA1C 6.1* 07/29/2015   ------------------------------------------------------------------------------------------------------------------ No results for input(s): TSH, T4TOTAL, T3FREE, THYROIDAB in the last 72 hours.  Invalid input(s): FREET3 ------------------------------------------------------------------------------------------------------------------ No results for input(s): VITAMINB12, FOLATE, FERRITIN, TIBC, IRON, RETICCTPCT in the last 72 hours.  Coagulation profile  Recent Labs Lab 07/28/15 2140  INR 1.05    No results for input(s): DDIMER in the last 72 hours.  Cardiac Enzymes  Recent Labs Lab 07/28/15 2140 07/29/15 0229 07/29/15 0803  TROPONINI 0.03 0.04* 0.03   ------------------------------------------------------------------------------------------------------------------    Component Value Date/Time   BNP 36.5 07/26/2015 1510    Inpatient Medications  Scheduled Meds: . antiseptic oral rinse  7 mL Mouth Rinse BID  . aspirin EC  81 mg Oral Daily  . busPIRone  15 mg Oral BID  . docusate sodium  100 mg Oral BID  . enoxaparin (LOVENOX) injection  40 mg Subcutaneous QHS  . feeding supplement (PRO-STAT SUGAR FREE 64)  30 mL Oral BID  . ferrous sulfate  325 mg Oral Q breakfast  . fluticasone  1 spray Each Nare  Daily  . furosemide  20 mg Intravenous Once  . gabapentin  300 mg Oral TID  . guaiFENesin  600 mg Oral BID  . hydrOXYzine  50 mg Oral TID  . ipratropium-albuterol  3 mL Nebulization QID  . levofloxacin  750 mg Oral Daily  . pantoprazole  40 mg Oral Daily  . PARoxetine  20 mg Oral Daily  . polyethylene glycol  17 g Oral Daily  . [START ON 08/01/2015] predniSONE  50 mg Oral Q breakfast  . tiotropium   18 mcg Inhalation Daily  . varenicline  1 mg Oral Daily   Continuous Infusions:   PRN Meds:.albuterol, cyclobenzaprine, morphine injection, ondansetron, oxyCODONE-acetaminophen  Micro Results Recent Results (from the past 240 hour(s))  Culture, blood (routine x 2) Call MD if unable to obtain prior to antibiotics being given     Status: None   Collection Time: 07/22/15 12:00 AM  Result Value Ref Range Status   Specimen Description BLOOD LEFT ANTECUBITAL  Final   Special Requests BOTTLES DRAWN AEROBIC AND ANAEROBIC 5CC  Final   Culture NO GROWTH 5 DAYS  Final   Report Status 07/27/2015 FINAL  Final  Culture, blood (routine x 2) Call MD if unable to obtain prior to antibiotics being given     Status: None   Collection Time: 07/22/15 12:05 AM  Result Value Ref Range Status   Specimen Description BLOOD LEFT ARM  Final   Special Requests BOTTLES DRAWN AEROBIC AND ANAEROBIC 5CC  Final   Culture NO GROWTH 5 DAYS  Final   Report Status 07/27/2015 FINAL  Final  MRSA PCR Screening     Status: None   Collection Time: 07/22/15  1:49 AM  Result Value Ref Range Status   MRSA by PCR NEGATIVE NEGATIVE Final    Comment:        The GeneXpert MRSA Assay (FDA approved for NASAL specimens only), is one component of a comprehensive MRSA colonization surveillance program. It is not intended to diagnose MRSA infection nor to guide or monitor treatment for MRSA infections.   Culture, sputum-assessment     Status: None   Collection Time: 07/22/15  8:23 PM  Result Value Ref Range Status   Specimen Description SPUTUM  Final   Special Requests NONE  Final   Sputum evaluation   Final    THIS SPECIMEN IS ACCEPTABLE. RESPIRATORY CULTURE REPORT TO FOLLOW.   Report Status 07/22/2015 FINAL  Final  Culture, respiratory (NON-Expectorated)     Status: None   Collection Time: 07/22/15 11:20 PM  Result Value Ref Range Status   Specimen Description EXPECTORATED SPUTUM  Final   Special Requests NONE  Final    Gram Stain   Final    ABUNDANT WBC PRESENT, PREDOMINANTLY PMN RARE SQUAMOUS EPITHELIAL CELLS PRESENT MODERATE GRAM NEGATIVE RODS MODERATE GRAM POSITIVE RODS RARE GRAM POSITIVE COCCI IN PAIRS THIS SPECIMEN IS ACCEPTABLE FOR SPUTUM CULTURE Performed at [facility]    Culture   Final    MODERATE PSEUDOMONAS AERUGINOSA Performed at [facility]    Report Status 07/25/2015 FINAL  Final   Organism ID, Bacteria PSEUDOMONAS AERUGINOSA  Final      Susceptibility   Pseudomonas aeruginosa - MIC*    CEFEPIME <=1 SENSITIVE Sensitive     CEFTAZIDIME 4 SENSITIVE Sensitive     CIPROFLOXACIN <=0.25 SENSITIVE Sensitive     GENTAMICIN <=1 SENSITIVE Sensitive     IMIPENEM 1 SENSITIVE Sensitive     PIP/TAZO <=4 SENSITIVE Sensitive  TOBRAMYCIN <=1 SENSITIVE Sensitive     * MODERATE PSEUDOMONAS AERUGINOSA  Culture, blood (routine x 2) Call MD if unable to obtain prior to antibiotics being given     Status: None (Preliminary result)   Collection Time: 07/28/15 10:27 PM  Result Value Ref Range Status   Specimen Description BLOOD BLOOD RIGHT FOREARM  Final   Special Requests BOTTLES DRAWN AEROBIC AND ANAEROBIC 5CC EACH  Final   Culture NO GROWTH 2 DAYS  Final   Report Status PENDING  Incomplete  Culture, blood (routine x 2) Call MD if unable to obtain prior to antibiotics being given     Status: None (Preliminary result)   Collection Time: 07/28/15 10:29 PM  Result Value Ref Range Status   Specimen Description BLOOD LEFT ARM  Final   Special Requests BOTTLES DRAWN AEROBIC AND ANAEROBIC 5CC   Final   Culture NO GROWTH 2 DAYS  Final   Report Status PENDING  Incomplete    Radiology Reports Dg Chest 2 View  07/28/2015  CLINICAL DATA:  Shortness of breath. Back pain between the shoulder blades beginning yesterday. Initial encounter. EXAM: CHEST  2 VIEW COMPARISON:  PA and lateral chest 07/26/2015.  CT chest 07/22/2015. FINDINGS: The lungs are severely emphysematous but clear. Heart  size is normal. No pneumothorax or pleural effusion. The patient is status post median sternotomy. Surgical clips left upper lobe are noted. IMPRESSION: Severe appearing emphysema without acute disease. Electronically Signed   By: Inge Rise M.D.   On: 07/28/2015 15:16   Dg Chest 2 View  07/26/2015  CLINICAL DATA:  Shortness of breath for 1 day.  Initial encounter. EXAM: CHEST  2 VIEW COMPARISON:  CT chest 07/22/2015.  PA and lateral chest 07/21/2015. FINDINGS: The lungs are severely emphysematous but clear. Heart size is normal. No pneumothorax or pleural effusion. The patient is status post CABG. IMPRESSION: Emphysema without acute disease. Electronically Signed   By: Inge Rise M.D.   On: 07/26/2015 16:00   Dg Chest 2 View  07/21/2015  CLINICAL DATA:  Chest pain and shortness of breath EXAM: CHEST  2 VIEW COMPARISON:  July 06, 2015 FINDINGS: There is underlying emphysematous change bilaterally, stable. There are stable scattered areas of scarring bilaterally. There is no edema or consolidation. The heart size is within normal limits. The pulmonary vascularity is stable and reflects underlying emphysema. Patient is status post median sternotomy. No adenopathy. No bone lesions. IMPRESSION: Underlying emphysematous change with scattered areas of scarring. No edema or consolidation. Stable cardiac silhouette. Electronically Signed   By: Lowella Grip III M.D.   On: 07/21/2015 22:03   Ct Angio Chest Pe W/cm &/or Wo Cm  07/22/2015  CLINICAL DATA:  Chest pain for 3 days. EXAM: CT ANGIOGRAPHY CHEST WITH CONTRAST TECHNIQUE: Multidetector CT imaging of the chest was performed using the standard protocol during bolus administration of intravenous contrast. Multiplanar CT image reconstructions and MIPs were obtained to evaluate the vascular anatomy. CONTRAST:  100 mL Isovue 370 intravenous COMPARISON:  Radiographs 07/21/2015, CT 08/07/2014 FINDINGS: Cardiovascular: There is good opacification of the  pulmonary arteries. There is no pulmonary embolism. The thoracic aorta is normal in caliber and intact. Lungs: Severe bullous emphysematous disease bilaterally. No confluent consolidation. No masses or suspicious nodules. No pneumothorax. Central airways: Retained secretions or aspiration in the right mainstem bronchus and right lower lobe bronchus. Effusions: None Lymphadenopathy: None Esophagus: Unremarkable Upper abdomen: Right renal cyst, unchanged. Musculoskeletal: No significant abnormality. Review of the MIP images confirms  the above findings. IMPRESSION: 1. Negative for acute pulmonary embolism. 2. Severe bullous emphysema. 3. Retained secretions or aspiration in the right mainstem bronchus and right lower lobe bronchus. Electronically Signed   By: Andreas Newport M.D.   On: 07/22/2015 02:31   Ct Abdomen Pelvis W Contrast  07/29/2015  CLINICAL DATA:  Mid abdominal pain for 1 year.  Abdominal mass. EXAM: CT ABDOMEN AND PELVIS WITH CONTRAST TECHNIQUE: Multidetector CT imaging of the abdomen and pelvis was performed using the standard protocol following bolus administration of intravenous contrast. CONTRAST:  158mL ISOVUE-300 IOPAMIDOL (ISOVUE-300) INJECTION 61% COMPARISON:  01/25/2015 FINDINGS: There is advanced emphysema at the lung bases with associated posterior basilar scarring. Mild diffuse hepatic steatosis Gallbladder, spleen, and pancreas are within normal limits. Small left adrenal nodule is stable on image 27. Right adrenal gland is normal. 4.7 cm complex cystic lesion in the right kidney is stable. Terminal ileum and appendix are unremarkable. Large bowel is distended with air-fluid levels. Descending and sigmoid colon are relatively decompressed, but there is no focal mass or stricture to suggest a transition point cause. Prostate is mildly enlarged. There is fluid in the distal right inguinal ring of unknown significance. No free-fluid.  No abnormal retroperitoneal adenopathy. There is gas  within the subcutaneous fat of the right lower quadrant likely from a recent injection. IMPRESSION: No acute intra-abdominal or intrapelvic pathology. Chronic changes are noted. Electronically Signed   By: Marybelle Killings M.D.   On: 07/29/2015 14:09    Time Spent in minutes  30   SINGH,PRASHANT K M.D on 07/31/2015 at 9:50 AM  Between 7am to 7pm - Pager - [phone]  After 7pm go to [email or web address] - password [facility]  [facility] -  Office  [phone]

## 2015-08-01 ENCOUNTER — Telehealth: Payer: Self-pay | Admitting: Internal Medicine

## 2015-08-01 ENCOUNTER — Inpatient Hospital Stay (HOSPITAL_COMMUNITY): Payer: Medicare Other

## 2015-08-01 DIAGNOSIS — F419 Anxiety disorder, unspecified: Secondary | ICD-10-CM | POA: Diagnosis not present

## 2015-08-01 DIAGNOSIS — R0602 Shortness of breath: Secondary | ICD-10-CM | POA: Diagnosis not present

## 2015-08-01 DIAGNOSIS — F339 Major depressive disorder, recurrent, unspecified: Secondary | ICD-10-CM | POA: Diagnosis not present

## 2015-08-01 DIAGNOSIS — I639 Cerebral infarction, unspecified: Secondary | ICD-10-CM | POA: Diagnosis not present

## 2015-08-01 DIAGNOSIS — J439 Emphysema, unspecified: Secondary | ICD-10-CM | POA: Diagnosis not present

## 2015-08-01 DIAGNOSIS — J9611 Chronic respiratory failure with hypoxia: Secondary | ICD-10-CM | POA: Diagnosis not present

## 2015-08-01 DIAGNOSIS — Z5189 Encounter for other specified aftercare: Secondary | ICD-10-CM | POA: Diagnosis not present

## 2015-08-01 DIAGNOSIS — Z736 Limitation of activities due to disability: Secondary | ICD-10-CM | POA: Diagnosis not present

## 2015-08-01 DIAGNOSIS — K5900 Constipation, unspecified: Secondary | ICD-10-CM | POA: Diagnosis not present

## 2015-08-01 DIAGNOSIS — G4700 Insomnia, unspecified: Secondary | ICD-10-CM | POA: Diagnosis not present

## 2015-08-01 DIAGNOSIS — I10 Essential (primary) hypertension: Secondary | ICD-10-CM | POA: Diagnosis not present

## 2015-08-01 DIAGNOSIS — R488 Other symbolic dysfunctions: Secondary | ICD-10-CM | POA: Diagnosis not present

## 2015-08-01 DIAGNOSIS — R079 Chest pain, unspecified: Secondary | ICD-10-CM | POA: Diagnosis not present

## 2015-08-01 DIAGNOSIS — F329 Major depressive disorder, single episode, unspecified: Secondary | ICD-10-CM | POA: Diagnosis not present

## 2015-08-01 DIAGNOSIS — M6281 Muscle weakness (generalized): Secondary | ICD-10-CM | POA: Diagnosis not present

## 2015-08-01 DIAGNOSIS — J441 Chronic obstructive pulmonary disease with (acute) exacerbation: Secondary | ICD-10-CM | POA: Diagnosis not present

## 2015-08-01 DIAGNOSIS — Z720 Tobacco use: Secondary | ICD-10-CM | POA: Diagnosis not present

## 2015-08-01 DIAGNOSIS — R279 Unspecified lack of coordination: Secondary | ICD-10-CM | POA: Diagnosis not present

## 2015-08-01 DIAGNOSIS — J9620 Acute and chronic respiratory failure, unspecified whether with hypoxia or hypercapnia: Secondary | ICD-10-CM | POA: Diagnosis not present

## 2015-08-01 DIAGNOSIS — Z9981 Dependence on supplemental oxygen: Secondary | ICD-10-CM | POA: Diagnosis not present

## 2015-08-01 DIAGNOSIS — R5381 Other malaise: Secondary | ICD-10-CM | POA: Diagnosis not present

## 2015-08-01 DIAGNOSIS — R2689 Other abnormalities of gait and mobility: Secondary | ICD-10-CM | POA: Diagnosis not present

## 2015-08-01 DIAGNOSIS — Z09 Encounter for follow-up examination after completed treatment for conditions other than malignant neoplasm: Secondary | ICD-10-CM | POA: Diagnosis not present

## 2015-08-01 DIAGNOSIS — F410 Panic disorder [episodic paroxysmal anxiety] without agoraphobia: Secondary | ICD-10-CM | POA: Diagnosis not present

## 2015-08-01 DIAGNOSIS — K219 Gastro-esophageal reflux disease without esophagitis: Secondary | ICD-10-CM | POA: Diagnosis not present

## 2015-08-01 DIAGNOSIS — J9621 Acute and chronic respiratory failure with hypoxia: Secondary | ICD-10-CM | POA: Diagnosis not present

## 2015-08-01 DIAGNOSIS — R569 Unspecified convulsions: Secondary | ICD-10-CM | POA: Diagnosis not present

## 2015-08-01 DIAGNOSIS — R109 Unspecified abdominal pain: Secondary | ICD-10-CM | POA: Diagnosis not present

## 2015-08-01 DIAGNOSIS — R05 Cough: Secondary | ICD-10-CM | POA: Diagnosis not present

## 2015-08-01 DIAGNOSIS — I69319 Unspecified symptoms and signs involving cognitive functions following cerebral infarction: Secondary | ICD-10-CM | POA: Diagnosis not present

## 2015-08-01 DIAGNOSIS — Z23 Encounter for immunization: Secondary | ICD-10-CM | POA: Diagnosis not present

## 2015-08-01 DIAGNOSIS — J449 Chronic obstructive pulmonary disease, unspecified: Secondary | ICD-10-CM | POA: Diagnosis not present

## 2015-08-01 MED ORDER — POLYETHYLENE GLYCOL 3350 17 G PO PACK: 17.0000 g | PACK | Freq: Every day | ORAL | Status: AC

## 2015-08-01 MED ORDER — FUROSEMIDE 10 MG/ML IJ SOLN
40.0000 mg | Freq: Once | INTRAMUSCULAR | Status: AC
  Administered 2015-08-01: 40 mg via INTRAVENOUS
  Filled 2015-08-01: qty 4

## 2015-08-01 MED ORDER — ASPIRIN 81 MG PO TBEC: 81.0000 mg | DELAYED_RELEASE_TABLET | Freq: Every day | ORAL | Status: AC

## 2015-08-01 MED ORDER — FLUTICASONE-SALMETEROL 500-50 MCG/DOSE IN AEPB: 1.0000 | INHALATION_SPRAY | Freq: Two times a day (BID) | RESPIRATORY_TRACT | Status: DC

## 2015-08-01 MED ORDER — PRO-STAT SUGAR FREE PO LIQD: 30.0000 mL | Freq: Two times a day (BID) | ORAL | Status: AC

## 2015-08-01 MED ORDER — LEVOFLOXACIN 750 MG PO TABS: 750.0000 mg | ORAL_TABLET | Freq: Every day | ORAL | Status: AC

## 2015-08-01 MED ORDER — PREDNISONE 5 MG PO TABS: ORAL_TABLET | ORAL | Status: AC

## 2015-08-01 NOTE — NC FL2 (Signed)
[place] LEVEL OF CARE SCREENING TOOL     IDENTIFICATION  Patient Name: Dustin Price Birthdate: [birth year] Sex: male Admission Date (Current Location): 07/28/2015  [place] and [place] Number:  [employer] and Address:  [facility] [place], [place] [address] [place], [place] [address]      Provider Number: [id number]  Attending Physician Name and Address:  Thurnell Lose, MD  Relative Name and Phone Number:       Current Level of Care: Hospital Recommended Level of Care: [place] Prior Approval Number:    Date Approved/Denied:   PASRR Number: [id number]  Discharge Plan: SNF    Current Diagnoses: Patient Active Problem List   Diagnosis Date Noted  . GERD (gastroesophageal reflux disease) 07/28/2015  . Abdominal pain 07/28/2015  . Acute on chronic respiratory failure with hypoxia ([place]) 07/28/2015  . Depression   . Tobacco abuse   . COPD exacerbation ([place]) 07/22/2015  . COPD (chronic obstructive pulmonary disease) ([place]) 07/21/2015  . Hypertension 07/21/2015  . Anxiety 07/21/2015  . Chest pain 07/21/2015    Orientation RESPIRATION BLADDER Height & Weight     Self, Time, Situation, Place  O2 ([place] 2L) Continent Weight: 141 lb 1.5 oz (64 kg) Height:  5\' 4"  (162.6 cm)  BEHAVIORAL SYMPTOMS/MOOD NEUROLOGICAL BOWEL NUTRITION STATUS      Continent Diet  AMBULATORY STATUS COMMUNICATION OF NEEDS Skin   Limited Assist Verbally Normal                       Personal Care Assistance Level of Assistance  Bathing, Dressing Bathing Assistance: Limited assistance   Dressing Assistance: Limited assistance     Functional Limitations Info             SPECIAL CARE FACTORS FREQUENCY  PT (By licensed PT), OT (By licensed OT)     PT Frequency: 5/wk OT Frequency: 5/wk            Contractures      Additional Factors Info  Code Status, Allergies, Psychotropic Code Status Info: FULL Allergies Info:  NKA Psychotropic Info: buspar         Current Medications (08/01/2015):  This is the current hospital active medication list Current Facility-Administered Medications  Medication Dose Route Frequency Provider Last Rate Last Dose  . albuterol (PROVENTIL) (2.5 MG/3ML) 0.083% nebulizer solution 2.5 mg  2.5 mg Nebulization Q4H PRN Thurnell Lose, MD   2.5 mg at 07/31/15 0419  . antiseptic oral rinse (CPC / CETYLPYRIDINIUM CHLORIDE 0.05%) solution 7 mL  7 mL Mouth Rinse BID Ivor Costa, MD   7 mL at 07/31/15 2211  . aspirin EC tablet 81 mg  81 mg Oral Daily Ivor Costa, MD   81 mg at 07/31/15 1048  . busPIRone (BUSPAR) tablet 15 mg  15 mg Oral BID Ivor Costa, MD   15 mg at 07/31/15 2207  . cyclobenzaprine (FLEXERIL) tablet 5 mg  5 mg Oral TID PRN Ivor Costa, MD      . docusate sodium (COLACE) capsule 100 mg  100 mg Oral BID Ivor Costa, MD   100 mg at 07/31/15 2207  . enoxaparin (LOVENOX) injection 40 mg  40 mg Subcutaneous QHS Ivor Costa, MD   40 mg at 07/31/15 2208  . feeding supplement (PRO-STAT SUGAR FREE 64) liquid 30 mL  30 mL Oral BID Thurnell Lose, MD   30 mL at 07/31/15 2208  . ferrous sulfate  tablet 325 mg  325 mg Oral Q breakfast Ivor Costa, MD   325 mg at 08/01/15 0751  . fluticasone (FLONASE) 50 MCG/ACT nasal spray 1 spray  1 spray Each Nare Daily Ivor Costa, MD   1 spray at 07/31/15 1056  . gabapentin (NEURONTIN) capsule 300 mg  300 mg Oral TID Ivor Costa, MD   300 mg at 07/31/15 2207  . guaiFENesin (MUCINEX) 12 hr tablet 600 mg  600 mg Oral BID Ivor Costa, MD   600 mg at 07/31/15 2208  . hydrOXYzine (ATARAX/VISTARIL) tablet 50 mg  50 mg Oral TID Ivor Costa, MD   50 mg at 07/31/15 2208  . ipratropium-albuterol (DUONEB) 0.5-2.5 (3) MG/3ML nebulizer solution 3 mL  3 mL Nebulization QID Ivor Costa, MD   3 mL at 08/01/15 [id number]  . levofloxacin (LEVAQUIN) tablet 750 mg  750 mg Oral Daily Ivor Costa, MD   750 mg at 07/31/15 1049  . morphine 2 MG/ML injection 1 mg  1 mg Intravenous Q3H PRN Ivor Costa, MD    1 mg at 07/31/15 1046  . ondansetron (ZOFRAN) injection 4 mg  4 mg Intravenous Q8H PRN Ivor Costa, MD      . oxyCODONE-acetaminophen (PERCOCET/ROXICET) 5-325 MG per tablet 1 tablet  1 tablet Oral Q4H PRN Ivor Costa, MD   1 tablet at 07/31/15 2208  . pantoprazole (PROTONIX) EC tablet 40 mg  40 mg Oral Daily Ivor Costa, MD   40 mg at 07/31/15 1049  . PARoxetine (PAXIL) tablet 20 mg  20 mg Oral Daily Ivor Costa, MD   20 mg at 07/31/15 1050  . polyethylene glycol (MIRALAX / GLYCOLAX) packet 17 g  17 g Oral Daily Ivor Costa, MD   17 g at 07/31/15 1050  . predniSONE (DELTASONE) tablet 50 mg  50 mg Oral Q breakfast Thurnell Lose, MD   50 mg at 08/01/15 0752  . tiotropium (SPIRIVA) inhalation capsule 18 mcg  18 mcg Inhalation Daily Ivor Costa, MD   18 mcg at 08/01/15 0800  . varenicline (CHANTIX) tablet 1 mg  1 mg Oral Daily Ivor Costa, MD   1 mg at 07/31/15 1049     Discharge Medications: Please see discharge summary for a list of discharge medications.  Relevant Imaging Results:  Relevant Lab Results:   Additional Information SS#: [id number]  [name], [place]

## 2015-08-01 NOTE — Discharge Instructions (Signed)
Follow with Primary MD  in 7 days   Get CBC, CMP, 2 view Chest X ray checked  by SNF MD in 3-5 days.    Activity: As tolerated with Full fall precautions use walker/cane & assistance as needed   Disposition SNF   Diet:   Heart Healthy with feeding assistance and aspiration precautions.  For Heart failure patients - Check your Weight same time everyday, if you gain over 2 pounds, or you develop in leg swelling, experience more shortness of breath or chest pain, call your Primary MD immediately. Follow Cardiac Low Salt Diet and 1.5 lit/day fluid restriction.   On your next visit with your primary care physician please Get Medicines reviewed and adjusted.   Please request your Prim.MD to go over all Hospital Tests and Procedure/Radiological results at the follow up, please get all Hospital records sent to your Prim MD by signing hospital release before you go home.   If you experience worsening of your admission symptoms, develop shortness of breath, life threatening emergency, suicidal or homicidal thoughts you must seek medical attention immediately by calling 911 or calling your MD immediately  if symptoms less severe.  You Must read complete instructions/literature along with all the possible adverse reactions/side effects for all the Medicines you take and that have been prescribed to you. Take any new Medicines after you have completely understood and accpet all the possible adverse reactions/side effects.   Do not drive, operating heavy machinery, perform activities at heights, swimming or participation in water activities or provide baby sitting services if your were admitted for syncope or siezures until you have seen by Primary MD or a Neurologist and advised to do so again.  Do not drive when taking Pain medications.    Do not take more than prescribed Pain, Sleep and Anxiety Medications  Special Instructions: If you have smoked or chewed Tobacco  in the last 2 yrs please  stop smoking, stop any regular Alcohol  and or any Recreational drug use.  Wear Seat belts while driving.   Please note  You were cared for by a hospitalist during your hospital stay. If you have any questions about your discharge medications or the care you received while you were in the hospital after you are discharged, you can call the unit and asked to speak with the hospitalist on call if the hospitalist that took care of you is not available. Once you are discharged, your primary care physician will handle any further medical issues. Please note that NO REFILLS for any discharge medications will be authorized once you are discharged, as it is imperative that you return to your primary care physician (or establish a relationship with a primary care physician if you do not have one) for your aftercare needs so that they can reassess your need for medications and monitor your lab values.

## 2015-08-01 NOTE — Progress Notes (Signed)
PT Cancellation Note  Patient Details Name: Dustin Price MRN: [id number] DOB: January 07, 1951   Cancelled Treatment:    Reason Eval/Treat Not Completed: Other (comment) (Politely declining PT; awaiting breathing treatment)   Will follow up later today as time allows;  Otherwise, will follow up for PT tomorrow;   Thank you,  Roney Marion, [place] Pager [phone] Office [phone]     [facility] 08/01/2015, 12:36 PM

## 2015-08-01 NOTE — Progress Notes (Signed)
Respiratory Therapist, Terri approached me with concerns of absent breath sounds throughout on Mr. Kilker after administering breathing treatment. Pt resting comfortably without distress when I entered room. Lung sounds diminished on left and absent on the right. O2 saturation is 100% via 3L [place]. Fredirick Maudlin, NP notified. Will continue to monitor.

## 2015-08-01 NOTE — Discharge Summary (Signed)
Dustin Price, is a 65 y.o. male  DOB [birth year]  MRN [id number].  Admission date:  07/28/2015  Admitting Physician  Ivor Costa, MD  Discharge Date:  08/01/2015   Primary MD  No PCP Per Patient  Recommendations for primary care physician for things to follow:   Check CBC, BMP and a 2 view chest x-ray in a week.  Needs pulmonary follow-up in 1-2 weeks   Admission Diagnosis  Tobacco abuse [Z72.0] Depression [F32.9] COPD exacerbation ([place]) [J44.1] Abdominal pain [R10.9]   Discharge Diagnosis  Tobacco abuse [Z72.0] Depression [F32.9] COPD exacerbation ([place]) [J44.1] Abdominal pain [R10.9]    Principal Problem:   COPD exacerbation ([place]) Active Problems:   Anxiety   Chest pain   GERD (gastroesophageal reflux disease)   Depression   Abdominal pain   Acute on chronic respiratory failure with hypoxia (H[facility])   Tobacco abuse      Past Medical History  Diagnosis Date  . Stroke ([place])   . COPD (chronic obstructive pulmonary disease) ([place])   . Hypertension   . Anxiety   . Seizures ([place])   . Depression   . Tobacco abuse     Past Surgical History  Procedure Laterality Date  . Lung removal, partial      For "cysts on my lung"  . Brain surgery      For bleeding after stroke       HPI  from the history and physical done on the day of admission:   Dustin Price is a 65 y.o. 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  Patient was recently hospitalized from 4/13-4/17/17 due to COPD exacerbation and chest pain. He had negative CTA chest (no PE), normal echo, and cardiac workup negative. He was discharged on Levaquin and  tapering dose prednisone. The patient does not have any of his inhalers with him, he states that he was staying with his nephew, who was arrested and thrown in jail. He states that he has worsening shortness of breath in the past 2 days. He also has productive cough with brownish colored sputum production. He haa chest pain over left sided chest, constant, pleuritic in nature, 9 out of 10 in severity. His pain radiates into the left upper back. He has tenderness over left upper back in the paraspinal muscle area. He denies any injury. He also reports having abdominal pain over upper abdomen. It is moderate, intermittent, nonradiating. He has nausea and vomited twice without blood in the vomitus. He is constipated for 3 days. Patient does not have symptoms of UTI or unilateral weakness. No fever or chills. He is a poor historian, he can not tell if he is taking steroids and antibiotics.   ED Course: pt was found to have WBC 11.9, temperature normal, transient tachycardia, transient tachypnea, electrolytes and renal function okay, negative troponin. Chest x-ray showed severe emphysema but  no infiltration. Patient is admitted to inpatient for further eval of containment and observation.     Hospital Course:    1. Acute on chronic respiratory failure with hypoxia due to COPD exacerbation/severe emphysema - No wheezing as emphysema is the main component, much improved after initial IV steroids, we will switch to Po steroids + PO Levaquin, Nebs, O2 as needed he does uses between 3 - 4 Lit/min [place] o2 at home. CTA & recent FLU PCR negative, no PNA, negative cultures.   Clinically better and at baseline we will switch him on oral steroid taper, and Advair, continue as needed albuterol, continue Spiriva inhalations, 3 more days of oral Levaquin with outpatient pulmonary follow-up in 1-2 weeks.  2. Musculoskeletal and reproducible chest pain from coughing. EKG unremarkable, 1 falsely positive troponin amongst 3  sets, supportive care. Placed on ASA.  3. Smoking. Counseled to quit.    4.GERD - PPI  5. Abd Pain - exam unremarkable, likely musculoskeletal from coughing, CT scan ordered upon admission was stable. Continue bowel regimen for constipation.  6. Gen weakness - PT, placement to SNF.    Follow UP  Follow-up Information    Follow up with PCP. Schedule an appointment as soon as possible for a visit in 1 week.      Follow up with RAMASWAMY,MURALI, MD. Schedule an appointment as soon as possible for a visit in 1 week.   Specialty:  Pulmonary Disease   Why:  COPD   Contact information:   [place] [id number] [phone]        Consults obtained - None  Discharge Condition: Fair  Diet and Activity recommendation: See Discharge Instructions below  Discharge Instructions           Discharge Instructions    Diet - low sodium heart healthy    Complete by:  As directed      Discharge instructions    Complete by:  As directed   Follow with Primary MD  in 7 days   Get CBC, CMP, 2 view Chest X ray checked  by SNF MD in 3-5 days.    Activity: As tolerated with Full fall precautions use walker/cane & assistance as needed   Disposition SNF   Diet:   Heart Healthy with feeding assistance and aspiration precautions.  For Heart failure patients - Check your Weight same time everyday, if you gain over 2 pounds, or you develop in leg swelling, experience more shortness of breath or chest pain, call your Primary MD immediately. Follow Cardiac Low Salt Diet and 1.5 lit/day fluid restriction.   On your next visit with your primary care physician please Get Medicines reviewed and adjusted.   Please request your Prim.MD to go over all Hospital Tests and Procedure/Radiological results at the follow up, please get all Hospital records sent to your Prim MD by signing hospital release before you go home.   If you experience worsening of your admission symptoms, develop  shortness of breath, life threatening emergency, suicidal or homicidal thoughts you must seek medical attention immediately by calling 911 or calling your MD immediately  if symptoms less severe.  You Must read complete instructions/literature along with all the possible adverse reactions/side effects for all the Medicines you take and that have been prescribed to you. Take any new Medicines after you have completely understood and accpet all the possible adverse reactions/side effects.   Do not drive, operating heavy machinery, perform activities at heights, swimming or participation  in water activities or provide baby sitting services if your were admitted for syncope or siezures until you have seen by Primary MD or a Neurologist and advised to do so again.  Do not drive when taking Pain medications.    Do not take more than prescribed Pain, Sleep and Anxiety Medications  Special Instructions: If you have smoked or chewed Tobacco  in the last 2 yrs please stop smoking, stop any regular Alcohol  and or any Recreational drug use.  Wear Seat belts while driving.   Please note  You were cared for by a hospitalist during your hospital stay. If you have any questions about your discharge medications or the care you received while you were in the hospital after you are discharged, you can call the unit and asked to speak with the hospitalist on call if the hospitalist that took care of you is not available. Once you are discharged, your primary care physician will handle any further medical issues. Please note that NO REFILLS for any discharge medications will be authorized once you are discharged, as it is imperative that you return to your primary care physician (or establish a relationship with a primary care physician if you do not have one) for your aftercare needs so that they can reassess your need for medications and monitor your lab values.     Increase activity slowly    Complete by:  As  directed              Discharge Medications       Medication List    STOP taking these medications        predniSONE 10 MG tablet  Commonly known as:  DELTASONE  Replaced by:  predniSONE 5 MG tablet      TAKE these medications        albuterol 108 (90 Base) MCG/ACT inhaler  Commonly known as:  PROVENTIL HFA;VENTOLIN HFA  Inhale 2 puffs into the lungs every 6 (six) hours as needed for wheezing or shortness of breath.     albuterol (2.5 MG/3ML) 0.083% nebulizer solution  Commonly known as:  PROVENTIL  Take 2.5 mg by nebulization every 6 (six) hours as needed for wheezing or shortness of breath.     aspirin 81 MG EC tablet  Take 1 tablet (81 mg total) by mouth daily.     busPIRone 15 MG tablet  Commonly known as:  BUSPAR  Take 15 mg by mouth 2 (two) times daily. Reported on 07/21/2015     docusate sodium 100 MG capsule  Commonly known as:  COLACE  Take 100 mg by mouth 2 (two) times daily as needed (constipation).     feeding supplement (PRO-STAT SUGAR FREE 64) Liqd  Take 30 mLs by mouth 2 (two) times daily.     fluticasone 50 MCG/ACT nasal spray  Commonly known as:  FLONASE  Place 1 spray into both nostrils daily.     Fluticasone-Salmeterol 500-50 MCG/DOSE Aepb  Commonly known as:  ADVAIR DISKUS  Inhale 1 puff into the lungs 2 (two) times daily.     gabapentin 300 MG capsule  Commonly known as:  NEURONTIN  Take 300 mg by mouth 3 (three) times daily.     guaiFENesin 600 MG 12 hr tablet  Commonly known as:  MUCINEX  Take 1 tablet (600 mg total) by mouth 2 (two) times daily.     hydrOXYzine 50 MG tablet  Commonly known as:  ATARAX/VISTARIL  Take 50 mg by mouth  3 (three) times daily.     levofloxacin 750 MG tablet  Commonly known as:  LEVAQUIN  Take 1 tablet (750 mg total) by mouth daily. 3 more days     omeprazole 40 MG capsule  Commonly known as:  PRILOSEC  Take 40 mg by mouth daily.     PARoxetine 20 MG tablet  Commonly known as:  PAXIL  Take 20 mg  by mouth daily.     polyethylene glycol packet  Commonly known as:  MIRALAX / GLYCOLAX  Take 17 g by mouth daily.     predniSONE 5 MG tablet  Commonly known as:  DELTASONE  Label  & dispense according to the schedule below. 10 Pills PO for 3 days then, 8 Pills PO for 3 days, 6 Pills PO for 3 days, 4 Pills PO for 3 days, 2 Pills PO for 3 days, 1 Pills PO for 3 days, 1/2 Pill  PO for 3 days then STOP. Total 95 pills.     sodium chloride 0.65 % Soln nasal spray  Commonly known as:  OCEAN  Place 1 spray into both nostrils as needed for congestion.     tiotropium 18 MCG inhalation capsule  Commonly known as:  SPIRIVA  Place 18 mcg into inhaler and inhale daily.     traZODone 100 MG tablet  Commonly known as:  DESYREL  Take 100 mg by mouth at bedtime.     varenicline 1 MG tablet  Commonly known as:  CHANTIX  Take 1 mg by mouth daily.        Major procedures and Radiology Reports - PLEASE review detailed and final reports for all details, in brief -       Dg Chest 2 View  07/28/2015  CLINICAL DATA:  Shortness of breath. Back pain between the shoulder blades beginning yesterday. Initial encounter. EXAM: CHEST  2 VIEW COMPARISON:  PA and lateral chest 07/26/2015.  CT chest 07/22/2015. FINDINGS: The lungs are severely emphysematous but clear. Heart size is normal. No pneumothorax or pleural effusion. The patient is status post median sternotomy. Surgical clips left upper lobe are noted. IMPRESSION: Severe appearing emphysema without acute disease. Electronically Signed   By: Inge Rise M.D.   On: 07/28/2015 15:16   Dg Chest 2 View  07/26/2015  CLINICAL DATA:  Shortness of breath for 1 day.  Initial encounter. EXAM: CHEST  2 VIEW COMPARISON:  CT chest 07/22/2015.  PA and lateral chest 07/21/2015. FINDINGS: The lungs are severely emphysematous but clear. Heart size is normal. No pneumothorax or pleural effusion. The patient is status post CABG. IMPRESSION: Emphysema without acute  disease. Electronically Signed   By: Inge Rise M.D.   On: 07/26/2015 16:00   Dg Chest 2 View  07/21/2015  CLINICAL DATA:  Chest pain and shortness of breath EXAM: CHEST  2 VIEW COMPARISON:  July 06, 2015 FINDINGS: There is underlying emphysematous change bilaterally, stable. There are stable scattered areas of scarring bilaterally. There is no edema or consolidation. The heart size is within normal limits. The pulmonary vascularity is stable and reflects underlying emphysema. Patient is status post median sternotomy. No adenopathy. No bone lesions. IMPRESSION: Underlying emphysematous change with scattered areas of scarring. No edema or consolidation. Stable cardiac silhouette. Electronically Signed   By: Lowella Grip III M.D.   On: 07/21/2015 22:03   Ct Angio Chest Pe W/cm &/or Wo Cm  07/22/2015  CLINICAL DATA:  Chest pain for 3 days. EXAM: CT ANGIOGRAPHY CHEST WITH  CONTRAST TECHNIQUE: Multidetector CT imaging of the chest was performed using the standard protocol during bolus administration of intravenous contrast. Multiplanar CT image reconstructions and MIPs were obtained to evaluate the vascular anatomy. CONTRAST:  100 mL Isovue 370 intravenous COMPARISON:  Radiographs 07/21/2015, CT 08/07/2014 FINDINGS: Cardiovascular: There is good opacification of the pulmonary arteries. There is no pulmonary embolism. The thoracic aorta is normal in caliber and intact. Lungs: Severe bullous emphysematous disease bilaterally. No confluent consolidation. No masses or suspicious nodules. No pneumothorax. Central airways: Retained secretions or aspiration in the right mainstem bronchus and right lower lobe bronchus. Effusions: None Lymphadenopathy: None Esophagus: Unremarkable Upper abdomen: Right renal cyst, unchanged. Musculoskeletal: No significant abnormality. Review of the MIP images confirms the above findings. IMPRESSION: 1. Negative for acute pulmonary embolism. 2. Severe bullous emphysema. 3.  Retained secretions or aspiration in the right mainstem bronchus and right lower lobe bronchus. Electronically Signed   By: Andreas Newport M.D.   On: 07/22/2015 02:31   Ct Abdomen Pelvis W Contrast  07/29/2015  CLINICAL DATA:  Mid abdominal pain for 1 year.  Abdominal mass. EXAM: CT ABDOMEN AND PELVIS WITH CONTRAST TECHNIQUE: Multidetector CT imaging of the abdomen and pelvis was performed using the standard protocol following bolus administration of intravenous contrast. CONTRAST:  177mL ISOVUE-300 IOPAMIDOL (ISOVUE-300) INJECTION 61% COMPARISON:  01/25/2015 FINDINGS: There is advanced emphysema at the lung bases with associated posterior basilar scarring. Mild diffuse hepatic steatosis Gallbladder, spleen, and pancreas are within normal limits. Small left adrenal nodule is stable on image 27. Right adrenal gland is normal. 4.7 cm complex cystic lesion in the right kidney is stable. Terminal ileum and appendix are unremarkable. Large bowel is distended with air-fluid levels. Descending and sigmoid colon are relatively decompressed, but there is no focal mass or stricture to suggest a transition point cause. Prostate is mildly enlarged. There is fluid in the distal right inguinal ring of unknown significance. No free-fluid.  No abnormal retroperitoneal adenopathy. There is gas within the subcutaneous fat of the right lower quadrant likely from a recent injection. IMPRESSION: No acute intra-abdominal or intrapelvic pathology. Chronic changes are noted. Electronically Signed   By: Marybelle Killings M.D.   On: 07/29/2015 14:09   Dg Chest Port 1 View  08/01/2015  CLINICAL DATA:  Shortness of breath . EXAM: PORTABLE CHEST 1 VIEW COMPARISON:  07/28/2015.  07/21/2015.  09/20/2014.  CT 07/22/2015 . FINDINGS: Mediastinum hilar structures normal. Prior CABG. Low lung volumes. Mild bibasilar pleural parenchymal thickening most likely secondary to scarring. IMPRESSION: 1.  Prior median sternotomy.  Heart size normal. 2.   Bibasilar subsegmental atelectasis and/or scarring. Electronically Signed   By: Marcello Moores  Register   On: 08/01/2015 07:53    Micro Results      Recent Results (from the past 240 hour(s))  Culture, sputum-assessment     Status: None   Collection Time: 07/22/15  8:23 PM  Result Value Ref Range Status   Specimen Description SPUTUM  Final   Special Requests NONE  Final   Sputum evaluation   Final    THIS SPECIMEN IS ACCEPTABLE. RESPIRATORY CULTURE REPORT TO FOLLOW.   Report Status 07/22/2015 FINAL  Final  Culture, respiratory (NON-Expectorated)     Status: None   Collection Time: 07/22/15 11:20 PM  Result Value Ref Range Status   Specimen Description EXPECTORATED SPUTUM  Final   Special Requests NONE  Final   Gram Stain   Final    ABUNDANT WBC PRESENT, PREDOMINANTLY PMN RARE SQUAMOUS EPITHELIAL  CELLS PRESENT MODERATE GRAM NEGATIVE RODS MODERATE GRAM POSITIVE RODS RARE GRAM POSITIVE COCCI IN PAIRS THIS SPECIMEN IS ACCEPTABLE FOR SPUTUM CULTURE Performed at [facility]    Culture   Final    MODERATE PSEUDOMONAS AERUGINOSA Performed at [facility]    Report Status 07/25/2015 FINAL  Final   Organism ID, Bacteria PSEUDOMONAS AERUGINOSA  Final      Susceptibility   Pseudomonas aeruginosa - MIC*    CEFEPIME <=1 SENSITIVE Sensitive     CEFTAZIDIME 4 SENSITIVE Sensitive     CIPROFLOXACIN <=0.25 SENSITIVE Sensitive     GENTAMICIN <=1 SENSITIVE Sensitive     IMIPENEM 1 SENSITIVE Sensitive     PIP/TAZO <=4 SENSITIVE Sensitive     TOBRAMYCIN <=1 SENSITIVE Sensitive     * MODERATE PSEUDOMONAS AERUGINOSA  Culture, blood (routine x 2) Call MD if unable to obtain prior to antibiotics being given     Status: None (Preliminary result)   Collection Time: 07/28/15 10:27 PM  Result Value Ref Range Status   Specimen Description BLOOD BLOOD RIGHT FOREARM  Final   Special Requests BOTTLES DRAWN AEROBIC AND ANAEROBIC 5CC EACH  Final   Culture NO GROWTH 3 DAYS  Final   Report  Status PENDING  Incomplete  Culture, blood (routine x 2) Call MD if unable to obtain prior to antibiotics being given     Status: None (Preliminary result)   Collection Time: 07/28/15 10:29 PM  Result Value Ref Range Status   Specimen Description BLOOD LEFT ARM  Final   Special Requests BOTTLES DRAWN AEROBIC AND ANAEROBIC 5CC   Final   Culture NO GROWTH 3 DAYS  Final   Report Status PENDING  Incomplete       Today   Subjective    Tyrike Greulich today has no headache,no chest abdominal pain,no new weakness tingling or numbness, feels much better    Objective   Blood pressure 148/75, pulse 91, temperature 98.2 F (36.8 C), temperature source Oral, resp. rate 18, height 5\' 4"  (1.626 m), weight 64 kg (141 lb 1.5 oz), SpO2 98 %.   Intake/Output Summary (Last 24 hours) at 08/01/15 0942 Last data filed at 08/01/15 0938  Gross per 24 hour  Intake    960 ml  Output   2325 ml  Net  -1365 ml    Exam Awake Alert, Oriented x 3, No new F.N deficits, Normal affect [place].AT,PERRAL Supple Neck,No JVD, No cervical lymphadenopathy appriciated.  Symmetrical Chest wall movement, Good air movement bilaterally, No wheezing RRR,No Gallops,Rubs or new Murmurs, No Parasternal Heave +ve B.Sounds, Abd Soft, Non tender, No organomegaly appriciated, No rebound -guarding or rigidity. No Cyanosis, Clubbing or edema, No new Rash or bruise   Data Review   CBC w Diff:  Lab Results  Component Value Date   WBC 11.9* 07/28/2015   HGB 12.0* 07/28/2015   HCT 38.6* 07/28/2015   PLT 391 07/28/2015   LYMPHOPCT 14 07/26/2015   MONOPCT 8 07/26/2015   EOSPCT 0 07/26/2015   BASOPCT 0 07/26/2015    CMP:  Lab Results  Component Value Date   NA 138 07/28/2015   K 4.5 07/28/2015   CL 97* 07/28/2015   CO2 29 07/28/2015   BUN 13 07/28/2015   CREATININE 0.90 07/28/2015   PROT 5.8* 07/26/2015   ALBUMIN 3.3* 07/26/2015   BILITOT 0.4 07/26/2015   ALKPHOS 54 07/26/2015   AST 18 07/26/2015   ALT 28  07/26/2015  .   Total Time in  preparing paper work, data evaluation and todays exam - 35 minutes  Thurnell Lose M.D on 08/01/2015 at 9:42 AM  [facility]   Office  [phone]

## 2015-08-01 NOTE — Telephone Encounter (Signed)
Spoke with Dustin Price. Pt has been scheduled for a hospital follow up on 08/19/15 at 2:45pm. Dustin Price stated that it did not have to be within the next 1 week. Nothing further was needed.

## 2015-08-01 NOTE — Progress Notes (Signed)
Pt has been planning on transitioning to [facility] for SNF/ eventual long term care.  CSW contacted SNF and they evaluated pt this afternoon for admission and can accept pt this evening.  Patient will discharge to [facility] Anticipated discharge date: 4/24 Family notified: pt sister Transportation by [facility]AR- RN to call when pt finishes dinner- [phone] ext [phone] than ext [phone]  CSW signing off.  Domenica Reamer, [place] Social Worker [phone]

## 2015-08-01 NOTE — Progress Notes (Signed)
Report called to [facility] at [facility].  All questions answered,

## 2015-08-02 LAB — CULTURE, BLOOD (ROUTINE X 2)
Culture: NO GROWTH
Culture: NO GROWTH

## 2015-08-05 DIAGNOSIS — F410 Panic disorder [episodic paroxysmal anxiety] without agoraphobia: Secondary | ICD-10-CM | POA: Diagnosis not present

## 2015-08-05 DIAGNOSIS — J449 Chronic obstructive pulmonary disease, unspecified: Secondary | ICD-10-CM | POA: Diagnosis not present

## 2015-08-05 DIAGNOSIS — I69319 Unspecified symptoms and signs involving cognitive functions following cerebral infarction: Secondary | ICD-10-CM | POA: Diagnosis not present

## 2015-08-05 DIAGNOSIS — J9620 Acute and chronic respiratory failure, unspecified whether with hypoxia or hypercapnia: Secondary | ICD-10-CM | POA: Diagnosis not present

## 2015-08-19 ENCOUNTER — Ambulatory Visit (INDEPENDENT_AMBULATORY_CARE_PROVIDER_SITE_OTHER): Payer: Medicare Other | Admitting: Internal Medicine

## 2015-08-19 ENCOUNTER — Encounter: Payer: Self-pay | Admitting: Internal Medicine

## 2015-08-19 VITALS — BP 120/60 | HR 101 | Ht 64.0 in | Wt 167.0 lb

## 2015-08-19 DIAGNOSIS — J9611 Chronic respiratory failure with hypoxia: Secondary | ICD-10-CM | POA: Insufficient documentation

## 2015-08-19 DIAGNOSIS — J449 Chronic obstructive pulmonary disease, unspecified: Secondary | ICD-10-CM

## 2015-08-19 DIAGNOSIS — R5381 Other malaise: Secondary | ICD-10-CM
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
## 2015-08-19 NOTE — Patient Instructions (Addendum)
ICD-9-CM ICD-10-CM   1. COPD, severe ([place]) 496 J44.9   2. Chronic hypoxemic respiratory failure (HCC) 518.83 J96.11    799.02    3. Physical deconditioning 799.3 R53.81    Stable disease after recent flare up x 2 in April 2017 Significant physical deconditioning +  Plan Continue flonase, oxygen, spiriva and breo as before Continue albuterol as needed Continue physical therapy program at the nursing home Flu shot in the fall  Follow-up - 4 months or sooner if needed

## 2015-08-26 DIAGNOSIS — J449 Chronic obstructive pulmonary disease, unspecified: Secondary | ICD-10-CM | POA: Diagnosis not present

## 2015-08-26 DIAGNOSIS — J9611 Chronic respiratory failure with hypoxia: Secondary | ICD-10-CM | POA: Diagnosis not present

## 2015-09-17 DIAGNOSIS — R569 Unspecified convulsions: Secondary | ICD-10-CM | POA: Diagnosis not present

## 2015-09-17 DIAGNOSIS — I10 Essential (primary) hypertension: Secondary | ICD-10-CM | POA: Diagnosis not present

## 2015-09-18 DIAGNOSIS — I10 Essential (primary) hypertension: Secondary | ICD-10-CM | POA: Diagnosis not present

## 2015-09-19 DIAGNOSIS — R569 Unspecified convulsions: Secondary | ICD-10-CM | POA: Diagnosis not present

## 2015-09-19 DIAGNOSIS — I10 Essential (primary) hypertension: Secondary | ICD-10-CM | POA: Diagnosis not present

## 2015-09-22 DIAGNOSIS — J449 Chronic obstructive pulmonary disease, unspecified: Secondary | ICD-10-CM | POA: Diagnosis not present

## 2015-09-22 DIAGNOSIS — D649 Anemia, unspecified: Secondary | ICD-10-CM | POA: Diagnosis not present

## 2015-09-23 DIAGNOSIS — J439 Emphysema, unspecified: Secondary | ICD-10-CM | POA: Diagnosis not present

## 2015-09-23 DIAGNOSIS — K219 Gastro-esophageal reflux disease without esophagitis: Secondary | ICD-10-CM | POA: Diagnosis not present

## 2015-09-23 DIAGNOSIS — J441 Chronic obstructive pulmonary disease with (acute) exacerbation: Secondary | ICD-10-CM | POA: Diagnosis not present

## 2015-09-23 DIAGNOSIS — R109 Unspecified abdominal pain: Secondary | ICD-10-CM | POA: Diagnosis not present

## 2015-09-23 DIAGNOSIS — F419 Anxiety disorder, unspecified: Secondary | ICD-10-CM | POA: Diagnosis not present

## 2015-09-23 DIAGNOSIS — F329 Major depressive disorder, single episode, unspecified: Secondary | ICD-10-CM | POA: Diagnosis not present

## 2015-09-23 DIAGNOSIS — R569 Unspecified convulsions: Secondary | ICD-10-CM | POA: Diagnosis not present

## 2015-09-23 DIAGNOSIS — Z720 Tobacco use: Secondary | ICD-10-CM | POA: Diagnosis not present

## 2015-09-23 DIAGNOSIS — I10 Essential (primary) hypertension: Secondary | ICD-10-CM | POA: Diagnosis not present

## 2015-09-23 DIAGNOSIS — Z9981 Dependence on supplemental oxygen: Secondary | ICD-10-CM | POA: Diagnosis not present

## 2015-09-23 DIAGNOSIS — I639 Cerebral infarction, unspecified: Secondary | ICD-10-CM | POA: Diagnosis not present

## 2015-09-23 DIAGNOSIS — Z5189 Encounter for other specified aftercare: Secondary | ICD-10-CM | POA: Diagnosis not present

## 2015-09-26 DIAGNOSIS — R0981 Nasal congestion: Secondary | ICD-10-CM | POA: Diagnosis not present

## 2015-09-26 DIAGNOSIS — R0602 Shortness of breath: Secondary | ICD-10-CM | POA: Diagnosis not present

## 2015-09-26 DIAGNOSIS — R05 Cough: Secondary | ICD-10-CM | POA: Diagnosis not present

## 2015-09-27 DIAGNOSIS — G4700 Insomnia, unspecified: Secondary | ICD-10-CM | POA: Diagnosis not present

## 2015-09-27 DIAGNOSIS — F333 Major depressive disorder, recurrent, severe with psychotic symptoms: Secondary | ICD-10-CM | POA: Diagnosis not present

## 2015-09-27 DIAGNOSIS — F419 Anxiety disorder, unspecified: Secondary | ICD-10-CM | POA: Diagnosis not present

## 2015-10-05 DIAGNOSIS — F419 Anxiety disorder, unspecified: Secondary | ICD-10-CM | POA: Diagnosis not present

## 2015-10-05 DIAGNOSIS — I10 Essential (primary) hypertension: Secondary | ICD-10-CM | POA: Diagnosis not present

## 2015-10-05 DIAGNOSIS — R109 Unspecified abdominal pain: Secondary | ICD-10-CM | POA: Diagnosis not present

## 2015-10-05 DIAGNOSIS — I639 Cerebral infarction, unspecified: Secondary | ICD-10-CM | POA: Diagnosis not present

## 2015-10-05 DIAGNOSIS — R569 Unspecified convulsions: Secondary | ICD-10-CM | POA: Diagnosis not present

## 2015-10-05 DIAGNOSIS — K219 Gastro-esophageal reflux disease without esophagitis: Secondary | ICD-10-CM | POA: Diagnosis not present

## 2015-10-05 DIAGNOSIS — Z5189 Encounter for other specified aftercare: Secondary | ICD-10-CM | POA: Diagnosis not present

## 2015-10-05 DIAGNOSIS — F329 Major depressive disorder, single episode, unspecified: Secondary | ICD-10-CM | POA: Diagnosis not present

## 2015-10-07 DIAGNOSIS — M79652 Pain in left thigh: Secondary | ICD-10-CM | POA: Diagnosis not present

## 2015-10-07 DIAGNOSIS — I10 Essential (primary) hypertension: Secondary | ICD-10-CM | POA: Diagnosis not present

## 2015-10-07 DIAGNOSIS — M25511 Pain in right shoulder: Secondary | ICD-10-CM | POA: Diagnosis not present

## 2015-10-07 DIAGNOSIS — M79651 Pain in right thigh: Secondary | ICD-10-CM | POA: Diagnosis not present

## 2015-10-07 DIAGNOSIS — M25512 Pain in left shoulder: Secondary | ICD-10-CM | POA: Diagnosis not present

## 2015-10-07 DIAGNOSIS — J449 Chronic obstructive pulmonary disease, unspecified: Secondary | ICD-10-CM | POA: Diagnosis not present

## 2015-10-07 DIAGNOSIS — T148 Other injury of unspecified body region: Secondary | ICD-10-CM | POA: Diagnosis not present

## 2015-10-07 DIAGNOSIS — Z87891 Personal history of nicotine dependence: Secondary | ICD-10-CM | POA: Diagnosis not present

## 2015-10-12 DIAGNOSIS — J439 Emphysema, unspecified: Secondary | ICD-10-CM | POA: Diagnosis not present

## 2015-10-12 DIAGNOSIS — R109 Unspecified abdominal pain: Secondary | ICD-10-CM | POA: Diagnosis not present

## 2015-10-12 DIAGNOSIS — Z5189 Encounter for other specified aftercare: Secondary | ICD-10-CM | POA: Diagnosis not present

## 2015-10-12 DIAGNOSIS — F329 Major depressive disorder, single episode, unspecified: Secondary | ICD-10-CM | POA: Diagnosis not present

## 2015-10-12 DIAGNOSIS — R569 Unspecified convulsions: Secondary | ICD-10-CM | POA: Diagnosis not present

## 2015-10-12 DIAGNOSIS — I639 Cerebral infarction, unspecified: Secondary | ICD-10-CM | POA: Diagnosis not present

## 2015-10-12 DIAGNOSIS — J9621 Acute and chronic respiratory failure with hypoxia: Secondary | ICD-10-CM | POA: Diagnosis not present

## 2015-10-12 DIAGNOSIS — F419 Anxiety disorder, unspecified: Secondary | ICD-10-CM | POA: Diagnosis not present

## 2015-10-14 DIAGNOSIS — I639 Cerebral infarction, unspecified: Secondary | ICD-10-CM | POA: Diagnosis not present

## 2015-10-14 DIAGNOSIS — R2689 Other abnormalities of gait and mobility: Secondary | ICD-10-CM | POA: Diagnosis not present

## 2015-10-14 DIAGNOSIS — K5900 Constipation, unspecified: Secondary | ICD-10-CM | POA: Diagnosis not present

## 2015-10-14 DIAGNOSIS — R0600 Dyspnea, unspecified: Secondary | ICD-10-CM | POA: Diagnosis not present

## 2015-10-14 DIAGNOSIS — Z5189 Encounter for other specified aftercare: Secondary | ICD-10-CM | POA: Diagnosis not present

## 2015-10-14 DIAGNOSIS — M6281 Muscle weakness (generalized): Secondary | ICD-10-CM | POA: Diagnosis not present

## 2015-10-14 DIAGNOSIS — R0789 Other chest pain: Secondary | ICD-10-CM | POA: Diagnosis present

## 2015-10-14 DIAGNOSIS — R569 Unspecified convulsions: Secondary | ICD-10-CM | POA: Diagnosis not present

## 2015-10-14 DIAGNOSIS — F410 Panic disorder [episodic paroxysmal anxiety] without agoraphobia: Secondary | ICD-10-CM | POA: Diagnosis not present

## 2015-10-14 DIAGNOSIS — Z736 Limitation of activities due to disability: Secondary | ICD-10-CM | POA: Diagnosis not present

## 2015-10-14 DIAGNOSIS — R262 Difficulty in walking, not elsewhere classified: Secondary | ICD-10-CM | POA: Diagnosis not present

## 2015-10-14 DIAGNOSIS — J9622 Acute and chronic respiratory failure with hypercapnia: Secondary | ICD-10-CM | POA: Diagnosis not present

## 2015-10-14 DIAGNOSIS — R079 Chest pain, unspecified: Secondary | ICD-10-CM | POA: Diagnosis not present

## 2015-10-14 DIAGNOSIS — R1907 Generalized intra-abdominal and pelvic swelling, mass and lump: Secondary | ICD-10-CM | POA: Diagnosis not present

## 2015-10-14 DIAGNOSIS — R918 Other nonspecific abnormal finding of lung field: Secondary | ICD-10-CM | POA: Diagnosis not present

## 2015-10-14 DIAGNOSIS — E860 Dehydration: Secondary | ICD-10-CM | POA: Diagnosis not present

## 2015-10-14 DIAGNOSIS — J439 Emphysema, unspecified: Secondary | ICD-10-CM | POA: Diagnosis not present

## 2015-10-14 DIAGNOSIS — K219 Gastro-esophageal reflux disease without esophagitis: Secondary | ICD-10-CM | POA: Diagnosis not present

## 2015-10-14 DIAGNOSIS — Z9981 Dependence on supplemental oxygen: Secondary | ICD-10-CM | POA: Diagnosis not present

## 2015-10-14 DIAGNOSIS — R4182 Altered mental status, unspecified: Secondary | ICD-10-CM | POA: Diagnosis not present

## 2015-10-14 DIAGNOSIS — D649 Anemia, unspecified: Secondary | ICD-10-CM | POA: Diagnosis not present

## 2015-10-14 DIAGNOSIS — I10 Essential (primary) hypertension: Secondary | ICD-10-CM | POA: Diagnosis not present

## 2015-10-14 DIAGNOSIS — F333 Major depressive disorder, recurrent, severe with psychotic symptoms: Secondary | ICD-10-CM | POA: Diagnosis not present

## 2015-10-14 DIAGNOSIS — D509 Iron deficiency anemia, unspecified: Secondary | ICD-10-CM | POA: Diagnosis not present

## 2015-10-14 DIAGNOSIS — Z87891 Personal history of nicotine dependence: Secondary | ICD-10-CM | POA: Diagnosis not present

## 2015-10-14 DIAGNOSIS — F419 Anxiety disorder, unspecified: Secondary | ICD-10-CM | POA: Diagnosis not present

## 2015-10-14 DIAGNOSIS — Z720 Tobacco use: Secondary | ICD-10-CM | POA: Diagnosis not present

## 2015-10-14 DIAGNOSIS — Z8673 Personal history of transient ischemic attack (TIA), and cerebral infarction without residual deficits: Secondary | ICD-10-CM | POA: Diagnosis not present

## 2015-10-14 DIAGNOSIS — R0602 Shortness of breath: Secondary | ICD-10-CM | POA: Diagnosis not present

## 2015-10-14 DIAGNOSIS — R109 Unspecified abdominal pain: Secondary | ICD-10-CM | POA: Diagnosis not present

## 2015-10-14 DIAGNOSIS — M2578 Osteophyte, vertebrae: Secondary | ICD-10-CM | POA: Diagnosis not present

## 2015-10-14 DIAGNOSIS — R488 Other symbolic dysfunctions: Secondary | ICD-10-CM | POA: Diagnosis not present

## 2015-10-14 DIAGNOSIS — G8929 Other chronic pain: Secondary | ICD-10-CM | POA: Diagnosis present

## 2015-10-14 DIAGNOSIS — Z7951 Long term (current) use of inhaled steroids: Secondary | ICD-10-CM | POA: Diagnosis not present

## 2015-10-14 DIAGNOSIS — R5381 Other malaise: Secondary | ICD-10-CM | POA: Diagnosis not present

## 2015-10-14 DIAGNOSIS — F329 Major depressive disorder, single episode, unspecified: Secondary | ICD-10-CM | POA: Diagnosis not present

## 2015-10-14 DIAGNOSIS — J9621 Acute and chronic respiratory failure with hypoxia: Secondary | ICD-10-CM | POA: Diagnosis not present

## 2015-10-14 DIAGNOSIS — E872 Acidosis: Secondary | ICD-10-CM | POA: Diagnosis not present

## 2015-10-14 DIAGNOSIS — R0902 Hypoxemia: Secondary | ICD-10-CM | POA: Diagnosis not present

## 2015-10-14 DIAGNOSIS — N179 Acute kidney failure, unspecified: Secondary | ICD-10-CM | POA: Diagnosis not present

## 2015-10-14 DIAGNOSIS — G4700 Insomnia, unspecified: Secondary | ICD-10-CM | POA: Diagnosis not present

## 2015-10-14 DIAGNOSIS — I4510 Unspecified right bundle-branch block: Secondary | ICD-10-CM | POA: Diagnosis not present

## 2015-10-14 DIAGNOSIS — J441 Chronic obstructive pulmonary disease with (acute) exacerbation: Secondary | ICD-10-CM | POA: Diagnosis not present

## 2015-10-14 DIAGNOSIS — Z515 Encounter for palliative care: Secondary | ICD-10-CM | POA: Diagnosis not present

## 2015-10-14 DIAGNOSIS — R279 Unspecified lack of coordination: Secondary | ICD-10-CM | POA: Diagnosis not present

## 2015-10-14 DIAGNOSIS — J9611 Chronic respiratory failure with hypoxia: Secondary | ICD-10-CM | POA: Diagnosis not present

## 2015-10-14 DIAGNOSIS — D638 Anemia in other chronic diseases classified elsewhere: Secondary | ICD-10-CM | POA: Diagnosis present

## 2015-10-17 DIAGNOSIS — F419 Anxiety disorder, unspecified: Secondary | ICD-10-CM | POA: Diagnosis not present

## 2015-10-17 DIAGNOSIS — J9621 Acute and chronic respiratory failure with hypoxia: Secondary | ICD-10-CM | POA: Diagnosis not present

## 2015-10-17 DIAGNOSIS — J441 Chronic obstructive pulmonary disease with (acute) exacerbation: Secondary | ICD-10-CM | POA: Diagnosis not present

## 2015-10-17 DIAGNOSIS — G4700 Insomnia, unspecified: Secondary | ICD-10-CM | POA: Diagnosis not present

## 2015-10-17 DIAGNOSIS — Z5189 Encounter for other specified aftercare: Secondary | ICD-10-CM | POA: Diagnosis not present

## 2015-10-17 DIAGNOSIS — I639 Cerebral infarction, unspecified: Secondary | ICD-10-CM | POA: Diagnosis not present

## 2015-10-17 DIAGNOSIS — J9622 Acute and chronic respiratory failure with hypercapnia: Secondary | ICD-10-CM | POA: Diagnosis not present

## 2015-10-17 DIAGNOSIS — M6281 Muscle weakness (generalized): Secondary | ICD-10-CM | POA: Diagnosis not present

## 2015-10-17 DIAGNOSIS — Z720 Tobacco use: Secondary | ICD-10-CM | POA: Diagnosis not present

## 2015-10-17 DIAGNOSIS — Z87891 Personal history of nicotine dependence: Secondary | ICD-10-CM | POA: Diagnosis not present

## 2015-10-17 DIAGNOSIS — K219 Gastro-esophageal reflux disease without esophagitis: Secondary | ICD-10-CM | POA: Diagnosis not present

## 2015-10-17 DIAGNOSIS — K567 Ileus, unspecified: Secondary | ICD-10-CM | POA: Diagnosis not present

## 2015-10-17 DIAGNOSIS — D649 Anemia, unspecified: Secondary | ICD-10-CM | POA: Diagnosis not present

## 2015-10-17 DIAGNOSIS — R140 Abdominal distension (gaseous): Secondary | ICD-10-CM | POA: Diagnosis not present

## 2015-10-17 DIAGNOSIS — R262 Difficulty in walking, not elsewhere classified: Secondary | ICD-10-CM | POA: Diagnosis not present

## 2015-10-17 DIAGNOSIS — I10 Essential (primary) hypertension: Secondary | ICD-10-CM | POA: Diagnosis not present

## 2015-10-17 DIAGNOSIS — J439 Emphysema, unspecified: Secondary | ICD-10-CM | POA: Diagnosis not present

## 2015-10-17 DIAGNOSIS — R0600 Dyspnea, unspecified: Secondary | ICD-10-CM | POA: Diagnosis not present

## 2015-10-17 DIAGNOSIS — R5381 Other malaise: Secondary | ICD-10-CM | POA: Diagnosis not present

## 2015-10-17 DIAGNOSIS — R2689 Other abnormalities of gait and mobility: Secondary | ICD-10-CM | POA: Diagnosis not present

## 2015-10-17 DIAGNOSIS — K6389 Other specified diseases of intestine: Secondary | ICD-10-CM | POA: Diagnosis not present

## 2015-10-17 DIAGNOSIS — Z515 Encounter for palliative care: Secondary | ICD-10-CM | POA: Diagnosis not present

## 2015-10-17 DIAGNOSIS — R488 Other symbolic dysfunctions: Secondary | ICD-10-CM | POA: Diagnosis not present

## 2015-10-17 DIAGNOSIS — R279 Unspecified lack of coordination: Secondary | ICD-10-CM | POA: Diagnosis not present

## 2015-10-17 DIAGNOSIS — R109 Unspecified abdominal pain: Secondary | ICD-10-CM | POA: Diagnosis not present

## 2015-10-17 DIAGNOSIS — R079 Chest pain, unspecified: Secondary | ICD-10-CM | POA: Diagnosis not present

## 2015-10-17 DIAGNOSIS — Z9981 Dependence on supplemental oxygen: Secondary | ICD-10-CM | POA: Diagnosis not present

## 2015-10-17 DIAGNOSIS — M25562 Pain in left knee: Secondary | ICD-10-CM | POA: Diagnosis not present

## 2015-10-17 DIAGNOSIS — F329 Major depressive disorder, single episode, unspecified: Secondary | ICD-10-CM | POA: Diagnosis not present

## 2015-10-17 DIAGNOSIS — J449 Chronic obstructive pulmonary disease, unspecified: Secondary | ICD-10-CM | POA: Diagnosis not present

## 2015-10-17 DIAGNOSIS — R6889 Other general symptoms and signs: Secondary | ICD-10-CM | POA: Diagnosis not present

## 2015-10-17 DIAGNOSIS — Z736 Limitation of activities due to disability: Secondary | ICD-10-CM | POA: Diagnosis not present

## 2015-10-17 DIAGNOSIS — R1907 Generalized intra-abdominal and pelvic swelling, mass and lump: Secondary | ICD-10-CM | POA: Diagnosis not present

## 2015-10-17 DIAGNOSIS — F333 Major depressive disorder, recurrent, severe with psychotic symptoms: Secondary | ICD-10-CM | POA: Diagnosis not present

## 2015-10-17 DIAGNOSIS — K5900 Constipation, unspecified: Secondary | ICD-10-CM | POA: Diagnosis not present

## 2015-10-17 DIAGNOSIS — R569 Unspecified convulsions: Secondary | ICD-10-CM | POA: Diagnosis not present

## 2015-10-17 DIAGNOSIS — I4510 Unspecified right bundle-branch block: Secondary | ICD-10-CM | POA: Diagnosis not present

## 2015-10-17 DIAGNOSIS — Z8673 Personal history of transient ischemic attack (TIA), and cerebral infarction without residual deficits: Secondary | ICD-10-CM | POA: Diagnosis not present

## 2015-10-17 DIAGNOSIS — M25561 Pain in right knee: Secondary | ICD-10-CM | POA: Diagnosis not present

## 2015-10-17 DIAGNOSIS — Z8249 Family history of ischemic heart disease and other diseases of the circulatory system: Secondary | ICD-10-CM | POA: Diagnosis not present

## 2015-10-17 DIAGNOSIS — Z7951 Long term (current) use of inhaled steroids: Secondary | ICD-10-CM | POA: Diagnosis not present

## 2015-10-17 DIAGNOSIS — R0689 Other abnormalities of breathing: Secondary | ICD-10-CM | POA: Diagnosis not present

## 2015-10-17 DIAGNOSIS — R197 Diarrhea, unspecified: Secondary | ICD-10-CM | POA: Diagnosis not present

## 2015-10-17 DIAGNOSIS — M25559 Pain in unspecified hip: Secondary | ICD-10-CM | POA: Diagnosis not present

## 2015-10-17 DIAGNOSIS — Z79899 Other long term (current) drug therapy: Secondary | ICD-10-CM | POA: Diagnosis not present

## 2015-10-18 DIAGNOSIS — F419 Anxiety disorder, unspecified: Secondary | ICD-10-CM | POA: Diagnosis not present

## 2015-10-18 DIAGNOSIS — G4700 Insomnia, unspecified: Secondary | ICD-10-CM | POA: Diagnosis not present

## 2015-10-18 DIAGNOSIS — F333 Major depressive disorder, recurrent, severe with psychotic symptoms: Secondary | ICD-10-CM | POA: Diagnosis not present

## 2015-10-23 DIAGNOSIS — K5900 Constipation, unspecified: Secondary | ICD-10-CM | POA: Diagnosis not present

## 2015-10-23 DIAGNOSIS — Z7951 Long term (current) use of inhaled steroids: Secondary | ICD-10-CM | POA: Diagnosis not present

## 2015-10-23 DIAGNOSIS — J441 Chronic obstructive pulmonary disease with (acute) exacerbation: Secondary | ICD-10-CM | POA: Diagnosis not present

## 2015-10-23 DIAGNOSIS — I10 Essential (primary) hypertension: Secondary | ICD-10-CM | POA: Diagnosis not present

## 2015-10-23 DIAGNOSIS — Z8673 Personal history of transient ischemic attack (TIA), and cerebral infarction without residual deficits: Secondary | ICD-10-CM | POA: Diagnosis not present

## 2015-10-23 DIAGNOSIS — Z87891 Personal history of nicotine dependence: Secondary | ICD-10-CM | POA: Diagnosis not present

## 2015-10-27 DIAGNOSIS — K5900 Constipation, unspecified: Secondary | ICD-10-CM | POA: Diagnosis not present

## 2015-10-27 DIAGNOSIS — I10 Essential (primary) hypertension: Secondary | ICD-10-CM | POA: Diagnosis not present

## 2015-10-27 DIAGNOSIS — R140 Abdominal distension (gaseous): Secondary | ICD-10-CM | POA: Diagnosis not present

## 2015-10-27 DIAGNOSIS — K6389 Other specified diseases of intestine: Secondary | ICD-10-CM | POA: Diagnosis not present

## 2015-10-27 DIAGNOSIS — J449 Chronic obstructive pulmonary disease, unspecified: Secondary | ICD-10-CM | POA: Diagnosis not present

## 2015-10-27 DIAGNOSIS — F419 Anxiety disorder, unspecified: Secondary | ICD-10-CM | POA: Diagnosis not present

## 2015-10-27 DIAGNOSIS — Z87891 Personal history of nicotine dependence: Secondary | ICD-10-CM | POA: Diagnosis not present

## 2015-10-29 DIAGNOSIS — J9622 Acute and chronic respiratory failure with hypercapnia: Secondary | ICD-10-CM | POA: Diagnosis not present

## 2015-10-29 DIAGNOSIS — R0689 Other abnormalities of breathing: Secondary | ICD-10-CM | POA: Diagnosis not present

## 2015-10-29 DIAGNOSIS — J441 Chronic obstructive pulmonary disease with (acute) exacerbation: Secondary | ICD-10-CM | POA: Diagnosis not present

## 2015-11-06 DIAGNOSIS — M25559 Pain in unspecified hip: Secondary | ICD-10-CM | POA: Diagnosis not present

## 2015-11-06 DIAGNOSIS — M25561 Pain in right knee: Secondary | ICD-10-CM | POA: Diagnosis not present

## 2015-11-06 DIAGNOSIS — I4510 Unspecified right bundle-branch block: Secondary | ICD-10-CM | POA: Diagnosis not present

## 2015-11-06 DIAGNOSIS — M25562 Pain in left knee: Secondary | ICD-10-CM | POA: Diagnosis not present

## 2015-11-06 DIAGNOSIS — I10 Essential (primary) hypertension: Secondary | ICD-10-CM | POA: Diagnosis not present

## 2015-11-06 DIAGNOSIS — J449 Chronic obstructive pulmonary disease, unspecified: Secondary | ICD-10-CM | POA: Diagnosis not present

## 2015-11-07 DIAGNOSIS — I10 Essential (primary) hypertension: Secondary | ICD-10-CM | POA: Diagnosis not present

## 2015-11-07 DIAGNOSIS — J449 Chronic obstructive pulmonary disease, unspecified: Secondary | ICD-10-CM | POA: Diagnosis not present

## 2015-11-07 DIAGNOSIS — R140 Abdominal distension (gaseous): Secondary | ICD-10-CM | POA: Diagnosis not present

## 2015-11-07 DIAGNOSIS — K5900 Constipation, unspecified: Secondary | ICD-10-CM | POA: Diagnosis not present

## 2015-11-07 DIAGNOSIS — J42 Unspecified chronic bronchitis: Secondary | ICD-10-CM | POA: Diagnosis not present

## 2015-11-07 DIAGNOSIS — Z8673 Personal history of transient ischemic attack (TIA), and cerebral infarction without residual deficits: Secondary | ICD-10-CM | POA: Diagnosis not present

## 2015-11-07 DIAGNOSIS — Z87891 Personal history of nicotine dependence: Secondary | ICD-10-CM | POA: Diagnosis not present

## 2015-11-07 DIAGNOSIS — F918 Other conduct disorders: Secondary | ICD-10-CM | POA: Diagnosis not present

## 2015-11-07 DIAGNOSIS — F419 Anxiety disorder, unspecified: Secondary | ICD-10-CM | POA: Diagnosis not present

## 2015-11-08 DIAGNOSIS — R0602 Shortness of breath: Secondary | ICD-10-CM | POA: Diagnosis not present

## 2015-11-08 DIAGNOSIS — F410 Panic disorder [episodic paroxysmal anxiety] without agoraphobia: Secondary | ICD-10-CM | POA: Diagnosis not present

## 2015-11-08 DIAGNOSIS — F0390 Unspecified dementia without behavioral disturbance: Secondary | ICD-10-CM | POA: Diagnosis not present

## 2015-11-08 DIAGNOSIS — R45851 Suicidal ideations: Secondary | ICD-10-CM | POA: Diagnosis not present

## 2015-11-08 DIAGNOSIS — R140 Abdominal distension (gaseous): Secondary | ICD-10-CM | POA: Diagnosis not present

## 2015-11-08 DIAGNOSIS — J42 Unspecified chronic bronchitis: Secondary | ICD-10-CM | POA: Diagnosis not present

## 2015-11-08 DIAGNOSIS — F419 Anxiety disorder, unspecified: Secondary | ICD-10-CM | POA: Diagnosis not present

## 2015-11-08 DIAGNOSIS — R9431 Abnormal electrocardiogram [ECG] [EKG]: Secondary | ICD-10-CM | POA: Diagnosis not present

## 2015-11-08 DIAGNOSIS — J440 Chronic obstructive pulmonary disease with acute lower respiratory infection: Secondary | ICD-10-CM | POA: Diagnosis not present

## 2015-11-08 DIAGNOSIS — J90 Pleural effusion, not elsewhere classified: Secondary | ICD-10-CM | POA: Diagnosis not present

## 2015-11-08 DIAGNOSIS — I4510 Unspecified right bundle-branch block: Secondary | ICD-10-CM | POA: Diagnosis not present

## 2015-11-08 DIAGNOSIS — Z9981 Dependence on supplemental oxygen: Secondary | ICD-10-CM | POA: Diagnosis not present

## 2015-11-08 DIAGNOSIS — F918 Other conduct disorders: Secondary | ICD-10-CM | POA: Diagnosis not present

## 2015-11-08 DIAGNOSIS — N281 Cyst of kidney, acquired: Secondary | ICD-10-CM | POA: Diagnosis not present

## 2015-11-08 DIAGNOSIS — I10 Essential (primary) hypertension: Secondary | ICD-10-CM | POA: Diagnosis not present

## 2015-11-08 DIAGNOSIS — J189 Pneumonia, unspecified organism: Secondary | ICD-10-CM | POA: Diagnosis not present

## 2015-11-08 DIAGNOSIS — J9612 Chronic respiratory failure with hypercapnia: Secondary | ICD-10-CM | POA: Diagnosis not present

## 2015-11-08 DIAGNOSIS — J181 Lobar pneumonia, unspecified organism: Secondary | ICD-10-CM | POA: Diagnosis not present

## 2015-11-08 DIAGNOSIS — R0989 Other specified symptoms and signs involving the circulatory and respiratory systems: Secondary | ICD-10-CM | POA: Diagnosis not present

## 2015-11-08 DIAGNOSIS — J188 Other pneumonia, unspecified organism: Secondary | ICD-10-CM | POA: Diagnosis not present

## 2015-11-08 DIAGNOSIS — K5900 Constipation, unspecified: Secondary | ICD-10-CM | POA: Diagnosis not present

## 2015-11-08 DIAGNOSIS — J9811 Atelectasis: Secondary | ICD-10-CM | POA: Diagnosis not present

## 2015-11-08 DIAGNOSIS — R1907 Generalized intra-abdominal and pelvic swelling, mass and lump: Secondary | ICD-10-CM | POA: Diagnosis not present

## 2015-11-08 DIAGNOSIS — R109 Unspecified abdominal pain: Secondary | ICD-10-CM | POA: Diagnosis not present

## 2015-11-08 DIAGNOSIS — R112 Nausea with vomiting, unspecified: Secondary | ICD-10-CM | POA: Diagnosis not present

## 2015-11-08 DIAGNOSIS — R462 Strange and inexplicable behavior: Secondary | ICD-10-CM | POA: Diagnosis not present

## 2015-11-08 DIAGNOSIS — F329 Major depressive disorder, single episode, unspecified: Secondary | ICD-10-CM | POA: Diagnosis not present

## 2015-11-09 DIAGNOSIS — J90 Pleural effusion, not elsewhere classified: Secondary | ICD-10-CM | POA: Diagnosis not present

## 2015-11-09 DIAGNOSIS — R45851 Suicidal ideations: Secondary | ICD-10-CM | POA: Diagnosis not present

## 2015-11-09 DIAGNOSIS — R0602 Shortness of breath: Secondary | ICD-10-CM | POA: Diagnosis not present

## 2015-11-09 DIAGNOSIS — F410 Panic disorder [episodic paroxysmal anxiety] without agoraphobia: Secondary | ICD-10-CM | POA: Diagnosis not present

## 2015-11-09 DIAGNOSIS — R140 Abdominal distension (gaseous): Secondary | ICD-10-CM | POA: Diagnosis not present

## 2015-11-09 DIAGNOSIS — R112 Nausea with vomiting, unspecified: Secondary | ICD-10-CM | POA: Diagnosis not present

## 2015-11-09 DIAGNOSIS — J9811 Atelectasis: Secondary | ICD-10-CM | POA: Diagnosis not present

## 2015-11-09 DIAGNOSIS — N281 Cyst of kidney, acquired: Secondary | ICD-10-CM | POA: Diagnosis not present

## 2015-11-09 DIAGNOSIS — F329 Major depressive disorder, single episode, unspecified: Secondary | ICD-10-CM | POA: Diagnosis not present

## 2015-11-09 DIAGNOSIS — R0989 Other specified symptoms and signs involving the circulatory and respiratory systems: Secondary | ICD-10-CM | POA: Diagnosis not present

## 2015-11-09 DIAGNOSIS — R109 Unspecified abdominal pain: Secondary | ICD-10-CM | POA: Diagnosis not present

## 2015-11-09 DIAGNOSIS — J189 Pneumonia, unspecified organism: Secondary | ICD-10-CM | POA: Diagnosis not present

## 2015-11-09 DIAGNOSIS — F419 Anxiety disorder, unspecified: Secondary | ICD-10-CM | POA: Diagnosis not present

## 2015-11-10 DIAGNOSIS — R2990 Unspecified symptoms and signs involving the nervous system: Secondary | ICD-10-CM | POA: Diagnosis not present

## 2015-11-10 DIAGNOSIS — R1010 Upper abdominal pain, unspecified: Secondary | ICD-10-CM | POA: Diagnosis not present

## 2015-11-10 DIAGNOSIS — I4510 Unspecified right bundle-branch block: Secondary | ICD-10-CM | POA: Diagnosis not present

## 2015-11-10 DIAGNOSIS — R0602 Shortness of breath: Secondary | ICD-10-CM | POA: Diagnosis not present

## 2015-11-10 DIAGNOSIS — R0600 Dyspnea, unspecified: Secondary | ICD-10-CM | POA: Diagnosis not present

## 2015-11-10 DIAGNOSIS — J9811 Atelectasis: Secondary | ICD-10-CM | POA: Diagnosis not present

## 2015-11-10 DIAGNOSIS — R531 Weakness: Secondary | ICD-10-CM | POA: Diagnosis not present

## 2015-11-10 DIAGNOSIS — R112 Nausea with vomiting, unspecified: Secondary | ICD-10-CM | POA: Diagnosis not present

## 2015-11-10 DIAGNOSIS — J90 Pleural effusion, not elsewhere classified: Secondary | ICD-10-CM | POA: Diagnosis not present

## 2015-11-10 DIAGNOSIS — J189 Pneumonia, unspecified organism: Secondary | ICD-10-CM | POA: Diagnosis not present

## 2015-11-10 DIAGNOSIS — R0989 Other specified symptoms and signs involving the circulatory and respiratory systems: Secondary | ICD-10-CM | POA: Diagnosis not present

## 2015-11-10 DIAGNOSIS — J188 Other pneumonia, unspecified organism: Secondary | ICD-10-CM | POA: Diagnosis not present

## 2015-11-10 DIAGNOSIS — R140 Abdominal distension (gaseous): Secondary | ICD-10-CM | POA: Diagnosis not present

## 2015-11-10 DIAGNOSIS — R9431 Abnormal electrocardiogram [ECG] [EKG]: Secondary | ICD-10-CM | POA: Diagnosis not present

## 2015-11-11 DIAGNOSIS — F0151 Vascular dementia with behavioral disturbance: Secondary | ICD-10-CM | POA: Diagnosis not present

## 2015-11-11 DIAGNOSIS — K436 Other and unspecified ventral hernia with obstruction, without gangrene: Secondary | ICD-10-CM | POA: Diagnosis not present

## 2015-11-11 DIAGNOSIS — I10 Essential (primary) hypertension: Secondary | ICD-10-CM | POA: Diagnosis present

## 2015-11-11 DIAGNOSIS — J440 Chronic obstructive pulmonary disease with acute lower respiratory infection: Secondary | ICD-10-CM | POA: Diagnosis present

## 2015-11-11 DIAGNOSIS — F17200 Nicotine dependence, unspecified, uncomplicated: Secondary | ICD-10-CM | POA: Diagnosis present

## 2015-11-11 DIAGNOSIS — Z79899 Other long term (current) drug therapy: Secondary | ICD-10-CM | POA: Diagnosis not present

## 2015-11-11 DIAGNOSIS — F418 Other specified anxiety disorders: Secondary | ICD-10-CM | POA: Diagnosis not present

## 2015-11-11 DIAGNOSIS — J439 Emphysema, unspecified: Secondary | ICD-10-CM | POA: Diagnosis not present

## 2015-11-11 DIAGNOSIS — R279 Unspecified lack of coordination: Secondary | ICD-10-CM | POA: Diagnosis not present

## 2015-11-11 DIAGNOSIS — R110 Nausea: Secondary | ICD-10-CM | POA: Diagnosis not present

## 2015-11-11 DIAGNOSIS — K429 Umbilical hernia without obstruction or gangrene: Secondary | ICD-10-CM | POA: Diagnosis present

## 2015-11-11 DIAGNOSIS — F410 Panic disorder [episodic paroxysmal anxiety] without agoraphobia: Secondary | ICD-10-CM | POA: Diagnosis not present

## 2015-11-11 DIAGNOSIS — K5900 Constipation, unspecified: Secondary | ICD-10-CM | POA: Diagnosis not present

## 2015-11-11 DIAGNOSIS — M6281 Muscle weakness (generalized): Secondary | ICD-10-CM | POA: Diagnosis not present

## 2015-11-11 DIAGNOSIS — R262 Difficulty in walking, not elsewhere classified: Secondary | ICD-10-CM | POA: Diagnosis not present

## 2015-11-11 DIAGNOSIS — R109 Unspecified abdominal pain: Secondary | ICD-10-CM | POA: Diagnosis not present

## 2015-11-11 DIAGNOSIS — Z741 Need for assistance with personal care: Secondary | ICD-10-CM | POA: Diagnosis not present

## 2015-11-11 DIAGNOSIS — E278 Other specified disorders of adrenal gland: Secondary | ICD-10-CM | POA: Diagnosis not present

## 2015-11-11 DIAGNOSIS — K589 Irritable bowel syndrome without diarrhea: Secondary | ICD-10-CM | POA: Diagnosis not present

## 2015-11-11 DIAGNOSIS — F419 Anxiety disorder, unspecified: Secondary | ICD-10-CM | POA: Diagnosis present

## 2015-11-11 DIAGNOSIS — D6489 Other specified anemias: Secondary | ICD-10-CM | POA: Diagnosis not present

## 2015-11-11 DIAGNOSIS — J181 Lobar pneumonia, unspecified organism: Secondary | ICD-10-CM | POA: Diagnosis not present

## 2015-11-11 DIAGNOSIS — N281 Cyst of kidney, acquired: Secondary | ICD-10-CM | POA: Diagnosis not present

## 2015-11-11 DIAGNOSIS — G3184 Mild cognitive impairment, so stated: Secondary | ICD-10-CM | POA: Diagnosis not present

## 2015-11-11 DIAGNOSIS — Z87891 Personal history of nicotine dependence: Secondary | ICD-10-CM | POA: Diagnosis not present

## 2015-11-11 DIAGNOSIS — J449 Chronic obstructive pulmonary disease, unspecified: Secondary | ICD-10-CM | POA: Diagnosis not present

## 2015-11-11 DIAGNOSIS — J189 Pneumonia, unspecified organism: Secondary | ICD-10-CM | POA: Diagnosis present

## 2015-11-11 DIAGNOSIS — D649 Anemia, unspecified: Secondary | ICD-10-CM | POA: Diagnosis present

## 2015-11-11 DIAGNOSIS — K439 Ventral hernia without obstruction or gangrene: Secondary | ICD-10-CM | POA: Diagnosis present

## 2015-11-11 DIAGNOSIS — R1084 Generalized abdominal pain: Secondary | ICD-10-CM | POA: Diagnosis not present

## 2015-11-11 DIAGNOSIS — R188 Other ascites: Secondary | ICD-10-CM | POA: Diagnosis not present

## 2015-11-11 DIAGNOSIS — Z9981 Dependence on supplemental oxygen: Secondary | ICD-10-CM | POA: Diagnosis not present

## 2015-11-11 DIAGNOSIS — F320 Major depressive disorder, single episode, mild: Secondary | ICD-10-CM | POA: Diagnosis not present

## 2015-11-11 DIAGNOSIS — Z8673 Personal history of transient ischemic attack (TIA), and cerebral infarction without residual deficits: Secondary | ICD-10-CM | POA: Diagnosis not present

## 2015-11-11 DIAGNOSIS — J9612 Chronic respiratory failure with hypercapnia: Secondary | ICD-10-CM | POA: Diagnosis present

## 2015-11-11 DIAGNOSIS — K567 Ileus, unspecified: Secondary | ICD-10-CM | POA: Diagnosis not present

## 2015-11-11 DIAGNOSIS — K219 Gastro-esophageal reflux disease without esophagitis: Secondary | ICD-10-CM | POA: Diagnosis present

## 2015-11-11 DIAGNOSIS — R4189 Other symptoms and signs involving cognitive functions and awareness: Secondary | ICD-10-CM | POA: Diagnosis not present

## 2015-11-11 DIAGNOSIS — R5381 Other malaise: Secondary | ICD-10-CM | POA: Diagnosis not present

## 2015-11-11 DIAGNOSIS — F0390 Unspecified dementia without behavioral disturbance: Secondary | ICD-10-CM | POA: Diagnosis present

## 2015-11-11 DIAGNOSIS — R1013 Epigastric pain: Secondary | ICD-10-CM | POA: Diagnosis not present

## 2015-11-11 DIAGNOSIS — G4700 Insomnia, unspecified: Secondary | ICD-10-CM | POA: Diagnosis not present

## 2015-11-11 DIAGNOSIS — F329 Major depressive disorder, single episode, unspecified: Secondary | ICD-10-CM | POA: Diagnosis present

## 2015-11-11 DIAGNOSIS — Z7409 Other reduced mobility: Secondary | ICD-10-CM | POA: Diagnosis not present

## 2015-11-11 DIAGNOSIS — K581 Irritable bowel syndrome with constipation: Secondary | ICD-10-CM | POA: Diagnosis present

## 2015-11-17 DIAGNOSIS — G629 Polyneuropathy, unspecified: Secondary | ICD-10-CM | POA: Diagnosis not present

## 2015-11-17 DIAGNOSIS — R748 Abnormal levels of other serum enzymes: Secondary | ICD-10-CM | POA: Diagnosis not present

## 2015-11-17 DIAGNOSIS — Z79899 Other long term (current) drug therapy: Secondary | ICD-10-CM | POA: Diagnosis not present

## 2015-11-17 DIAGNOSIS — F410 Panic disorder [episodic paroxysmal anxiety] without agoraphobia: Secondary | ICD-10-CM | POA: Diagnosis not present

## 2015-11-17 DIAGNOSIS — Z741 Need for assistance with personal care: Secondary | ICD-10-CM | POA: Diagnosis not present

## 2015-11-17 DIAGNOSIS — L218 Other seborrheic dermatitis: Secondary | ICD-10-CM | POA: Diagnosis not present

## 2015-11-17 DIAGNOSIS — G3184 Mild cognitive impairment, so stated: Secondary | ICD-10-CM | POA: Diagnosis not present

## 2015-11-17 DIAGNOSIS — K219 Gastro-esophageal reflux disease without esophagitis: Secondary | ICD-10-CM | POA: Diagnosis not present

## 2015-11-17 DIAGNOSIS — G8929 Other chronic pain: Secondary | ICD-10-CM | POA: Diagnosis not present

## 2015-11-17 DIAGNOSIS — F418 Other specified anxiety disorders: Secondary | ICD-10-CM | POA: Diagnosis not present

## 2015-11-17 DIAGNOSIS — M6281 Muscle weakness (generalized): Secondary | ICD-10-CM | POA: Diagnosis not present

## 2015-11-17 DIAGNOSIS — R109 Unspecified abdominal pain: Secondary | ICD-10-CM | POA: Diagnosis not present

## 2015-11-17 DIAGNOSIS — F0151 Vascular dementia with behavioral disturbance: Secondary | ICD-10-CM | POA: Diagnosis not present

## 2015-11-17 DIAGNOSIS — R4189 Other symptoms and signs involving cognitive functions and awareness: Secondary | ICD-10-CM | POA: Diagnosis not present

## 2015-11-17 DIAGNOSIS — R1032 Left lower quadrant pain: Secondary | ICD-10-CM | POA: Diagnosis not present

## 2015-11-17 DIAGNOSIS — K5909 Other constipation: Secondary | ICD-10-CM | POA: Diagnosis not present

## 2015-11-17 DIAGNOSIS — M6250 Muscle wasting and atrophy, not elsewhere classified, unspecified site: Secondary | ICD-10-CM | POA: Diagnosis not present

## 2015-11-17 DIAGNOSIS — R1013 Epigastric pain: Secondary | ICD-10-CM | POA: Diagnosis not present

## 2015-11-17 DIAGNOSIS — J439 Emphysema, unspecified: Secondary | ICD-10-CM | POA: Diagnosis not present

## 2015-11-17 DIAGNOSIS — R1012 Left upper quadrant pain: Secondary | ICD-10-CM | POA: Diagnosis not present

## 2015-11-17 DIAGNOSIS — D6489 Other specified anemias: Secondary | ICD-10-CM | POA: Diagnosis not present

## 2015-11-17 DIAGNOSIS — D649 Anemia, unspecified: Secondary | ICD-10-CM | POA: Diagnosis not present

## 2015-11-17 DIAGNOSIS — F320 Major depressive disorder, single episode, mild: Secondary | ICD-10-CM | POA: Diagnosis not present

## 2015-11-17 DIAGNOSIS — R279 Unspecified lack of coordination: Secondary | ICD-10-CM | POA: Diagnosis not present

## 2015-11-17 DIAGNOSIS — Z87891 Personal history of nicotine dependence: Secondary | ICD-10-CM | POA: Diagnosis not present

## 2015-11-17 DIAGNOSIS — N281 Cyst of kidney, acquired: Secondary | ICD-10-CM | POA: Diagnosis not present

## 2015-11-17 DIAGNOSIS — L723 Sebaceous cyst: Secondary | ICD-10-CM | POA: Diagnosis not present

## 2015-11-17 DIAGNOSIS — Z8673 Personal history of transient ischemic attack (TIA), and cerebral infarction without residual deficits: Secondary | ICD-10-CM | POA: Diagnosis not present

## 2015-11-17 DIAGNOSIS — R932 Abnormal findings on diagnostic imaging of liver and biliary tract: Secondary | ICD-10-CM | POA: Diagnosis not present

## 2015-11-17 DIAGNOSIS — Z8249 Family history of ischemic heart disease and other diseases of the circulatory system: Secondary | ICD-10-CM | POA: Diagnosis not present

## 2015-11-17 DIAGNOSIS — R9431 Abnormal electrocardiogram [ECG] [EKG]: Secondary | ICD-10-CM | POA: Diagnosis not present

## 2015-11-17 DIAGNOSIS — R935 Abnormal findings on diagnostic imaging of other abdominal regions, including retroperitoneum: Secondary | ICD-10-CM | POA: Diagnosis not present

## 2015-11-17 DIAGNOSIS — K5900 Constipation, unspecified: Secondary | ICD-10-CM | POA: Diagnosis not present

## 2015-11-17 DIAGNOSIS — G6289 Other specified polyneuropathies: Secondary | ICD-10-CM | POA: Diagnosis not present

## 2015-11-17 DIAGNOSIS — J440 Chronic obstructive pulmonary disease with acute lower respiratory infection: Secondary | ICD-10-CM | POA: Diagnosis not present

## 2015-11-17 DIAGNOSIS — R5381 Other malaise: Secondary | ICD-10-CM | POA: Diagnosis not present

## 2015-11-17 DIAGNOSIS — R110 Nausea: Secondary | ICD-10-CM | POA: Diagnosis not present

## 2015-11-17 DIAGNOSIS — R262 Difficulty in walking, not elsewhere classified: Secondary | ICD-10-CM | POA: Diagnosis not present

## 2015-11-17 DIAGNOSIS — F411 Generalized anxiety disorder: Secondary | ICD-10-CM | POA: Diagnosis not present

## 2015-11-17 DIAGNOSIS — F329 Major depressive disorder, single episode, unspecified: Secondary | ICD-10-CM | POA: Diagnosis not present

## 2015-11-17 DIAGNOSIS — R1084 Generalized abdominal pain: Secondary | ICD-10-CM | POA: Diagnosis not present

## 2015-11-17 DIAGNOSIS — I10 Essential (primary) hypertension: Secondary | ICD-10-CM | POA: Diagnosis not present

## 2015-11-17 DIAGNOSIS — G4700 Insomnia, unspecified: Secondary | ICD-10-CM | POA: Diagnosis not present

## 2015-11-17 DIAGNOSIS — K5669 Other intestinal obstruction: Secondary | ICD-10-CM | POA: Diagnosis not present

## 2015-11-17 DIAGNOSIS — Z7409 Other reduced mobility: Secondary | ICD-10-CM | POA: Diagnosis not present

## 2015-11-17 DIAGNOSIS — R531 Weakness: Secondary | ICD-10-CM | POA: Diagnosis not present

## 2015-11-17 DIAGNOSIS — R140 Abdominal distension (gaseous): Secondary | ICD-10-CM | POA: Diagnosis not present

## 2015-11-17 DIAGNOSIS — E278 Other specified disorders of adrenal gland: Secondary | ICD-10-CM | POA: Diagnosis not present

## 2015-11-17 DIAGNOSIS — J449 Chronic obstructive pulmonary disease, unspecified: Secondary | ICD-10-CM | POA: Diagnosis not present

## 2015-11-17 DIAGNOSIS — R2990 Unspecified symptoms and signs involving the nervous system: Secondary | ICD-10-CM | POA: Diagnosis not present

## 2015-11-17 DIAGNOSIS — R05 Cough: Secondary | ICD-10-CM | POA: Diagnosis not present

## 2015-11-17 DIAGNOSIS — J189 Pneumonia, unspecified organism: Secondary | ICD-10-CM | POA: Diagnosis not present

## 2015-11-17 DIAGNOSIS — Z7951 Long term (current) use of inhaled steroids: Secondary | ICD-10-CM | POA: Diagnosis not present

## 2015-11-17 DIAGNOSIS — I4510 Unspecified right bundle-branch block: Secondary | ICD-10-CM | POA: Diagnosis not present

## 2015-11-17 DIAGNOSIS — K567 Ileus, unspecified: Secondary | ICD-10-CM | POA: Diagnosis not present

## 2015-11-17 DIAGNOSIS — K769 Liver disease, unspecified: Secondary | ICD-10-CM | POA: Diagnosis not present

## 2015-11-17 DIAGNOSIS — F419 Anxiety disorder, unspecified: Secondary | ICD-10-CM | POA: Diagnosis not present

## 2015-11-17 DIAGNOSIS — R188 Other ascites: Secondary | ICD-10-CM | POA: Diagnosis not present

## 2015-11-17 DIAGNOSIS — K6389 Other specified diseases of intestine: Secondary | ICD-10-CM | POA: Diagnosis not present

## 2015-11-21 DIAGNOSIS — D649 Anemia, unspecified: Secondary | ICD-10-CM | POA: Diagnosis not present

## 2015-11-21 DIAGNOSIS — F419 Anxiety disorder, unspecified: Secondary | ICD-10-CM | POA: Diagnosis not present

## 2015-11-21 DIAGNOSIS — J449 Chronic obstructive pulmonary disease, unspecified: Secondary | ICD-10-CM | POA: Diagnosis not present

## 2015-11-21 DIAGNOSIS — R109 Unspecified abdominal pain: Secondary | ICD-10-CM | POA: Diagnosis not present

## 2015-11-23 DIAGNOSIS — J189 Pneumonia, unspecified organism: Secondary | ICD-10-CM | POA: Diagnosis not present

## 2015-11-23 DIAGNOSIS — D649 Anemia, unspecified: Secondary | ICD-10-CM | POA: Diagnosis not present

## 2015-11-23 DIAGNOSIS — R109 Unspecified abdominal pain: Secondary | ICD-10-CM | POA: Diagnosis not present

## 2015-11-23 DIAGNOSIS — J449 Chronic obstructive pulmonary disease, unspecified: Secondary | ICD-10-CM | POA: Diagnosis not present

## 2015-11-25 DIAGNOSIS — K5909 Other constipation: Secondary | ICD-10-CM | POA: Diagnosis not present

## 2015-11-25 DIAGNOSIS — I10 Essential (primary) hypertension: Secondary | ICD-10-CM | POA: Diagnosis not present

## 2015-11-25 DIAGNOSIS — J449 Chronic obstructive pulmonary disease, unspecified: Secondary | ICD-10-CM | POA: Diagnosis not present

## 2015-11-28 DIAGNOSIS — J449 Chronic obstructive pulmonary disease, unspecified: Secondary | ICD-10-CM | POA: Diagnosis not present

## 2015-11-28 DIAGNOSIS — G629 Polyneuropathy, unspecified: Secondary | ICD-10-CM | POA: Diagnosis not present

## 2015-11-28 DIAGNOSIS — K5909 Other constipation: Secondary | ICD-10-CM | POA: Diagnosis not present

## 2015-11-28 DIAGNOSIS — R109 Unspecified abdominal pain: Secondary | ICD-10-CM | POA: Diagnosis not present

## 2015-11-30 DIAGNOSIS — G629 Polyneuropathy, unspecified: Secondary | ICD-10-CM | POA: Diagnosis not present

## 2015-11-30 DIAGNOSIS — K5909 Other constipation: Secondary | ICD-10-CM | POA: Diagnosis not present

## 2015-11-30 DIAGNOSIS — D649 Anemia, unspecified: Secondary | ICD-10-CM | POA: Diagnosis not present

## 2015-11-30 DIAGNOSIS — F419 Anxiety disorder, unspecified: Secondary | ICD-10-CM | POA: Diagnosis not present

## 2015-12-01 DIAGNOSIS — K5909 Other constipation: Secondary | ICD-10-CM | POA: Diagnosis not present

## 2015-12-01 DIAGNOSIS — J449 Chronic obstructive pulmonary disease, unspecified: Secondary | ICD-10-CM | POA: Diagnosis not present

## 2015-12-01 DIAGNOSIS — R109 Unspecified abdominal pain: Secondary | ICD-10-CM | POA: Diagnosis not present

## 2015-12-01 DIAGNOSIS — G629 Polyneuropathy, unspecified: Secondary | ICD-10-CM | POA: Diagnosis not present

## 2015-12-05 DIAGNOSIS — D649 Anemia, unspecified: Secondary | ICD-10-CM | POA: Diagnosis not present

## 2015-12-05 DIAGNOSIS — K5909 Other constipation: Secondary | ICD-10-CM | POA: Diagnosis not present

## 2015-12-05 DIAGNOSIS — J449 Chronic obstructive pulmonary disease, unspecified: Secondary | ICD-10-CM | POA: Diagnosis not present

## 2015-12-05 DIAGNOSIS — F419 Anxiety disorder, unspecified: Secondary | ICD-10-CM | POA: Diagnosis not present

## 2015-12-07 DIAGNOSIS — K5909 Other constipation: Secondary | ICD-10-CM | POA: Diagnosis not present

## 2015-12-07 DIAGNOSIS — G6289 Other specified polyneuropathies: Secondary | ICD-10-CM | POA: Diagnosis not present

## 2015-12-07 DIAGNOSIS — F418 Other specified anxiety disorders: Secondary | ICD-10-CM | POA: Diagnosis not present

## 2015-12-13 DIAGNOSIS — J440 Chronic obstructive pulmonary disease with acute lower respiratory infection: Secondary | ICD-10-CM | POA: Diagnosis not present

## 2015-12-13 DIAGNOSIS — R1084 Generalized abdominal pain: Secondary | ICD-10-CM | POA: Diagnosis not present

## 2015-12-13 DIAGNOSIS — F418 Other specified anxiety disorders: Secondary | ICD-10-CM | POA: Diagnosis not present

## 2015-12-13 DIAGNOSIS — K5909 Other constipation: Secondary | ICD-10-CM | POA: Diagnosis not present

## 2015-12-14 DIAGNOSIS — R935 Abnormal findings on diagnostic imaging of other abdominal regions, including retroperitoneum: Secondary | ICD-10-CM | POA: Diagnosis not present

## 2015-12-14 DIAGNOSIS — R932 Abnormal findings on diagnostic imaging of liver and biliary tract: Secondary | ICD-10-CM | POA: Diagnosis not present

## 2015-12-14 DIAGNOSIS — E278 Other specified disorders of adrenal gland: Secondary | ICD-10-CM | POA: Diagnosis not present

## 2015-12-14 DIAGNOSIS — N281 Cyst of kidney, acquired: Secondary | ICD-10-CM | POA: Diagnosis not present

## 2015-12-14 DIAGNOSIS — R109 Unspecified abdominal pain: Secondary | ICD-10-CM | POA: Diagnosis not present

## 2015-12-14 DIAGNOSIS — K5900 Constipation, unspecified: Secondary | ICD-10-CM | POA: Diagnosis not present

## 2015-12-14 DIAGNOSIS — R110 Nausea: Secondary | ICD-10-CM | POA: Diagnosis not present

## 2015-12-14 DIAGNOSIS — G8929 Other chronic pain: Secondary | ICD-10-CM | POA: Diagnosis not present

## 2015-12-15 DIAGNOSIS — R1084 Generalized abdominal pain: Secondary | ICD-10-CM | POA: Diagnosis not present

## 2015-12-15 DIAGNOSIS — K5909 Other constipation: Secondary | ICD-10-CM | POA: Diagnosis not present

## 2015-12-15 DIAGNOSIS — F418 Other specified anxiety disorders: Secondary | ICD-10-CM | POA: Diagnosis not present

## 2015-12-19 DIAGNOSIS — J440 Chronic obstructive pulmonary disease with acute lower respiratory infection: Secondary | ICD-10-CM | POA: Diagnosis not present

## 2015-12-19 DIAGNOSIS — R1084 Generalized abdominal pain: Secondary | ICD-10-CM | POA: Diagnosis not present

## 2015-12-19 DIAGNOSIS — K5909 Other constipation: Secondary | ICD-10-CM | POA: Diagnosis not present

## 2015-12-21 ENCOUNTER — Ambulatory Visit: Payer: Medicare Other | Admitting: Internal Medicine

## 2015-12-23 DIAGNOSIS — D6489 Other specified anemias: Secondary | ICD-10-CM | POA: Diagnosis not present

## 2015-12-23 DIAGNOSIS — I10 Essential (primary) hypertension: Secondary | ICD-10-CM | POA: Diagnosis not present

## 2015-12-23 DIAGNOSIS — F418 Other specified anxiety disorders: Secondary | ICD-10-CM | POA: Diagnosis not present

## 2015-12-23 DIAGNOSIS — J440 Chronic obstructive pulmonary disease with acute lower respiratory infection: Secondary | ICD-10-CM | POA: Diagnosis not present

## 2015-12-28 DIAGNOSIS — L723 Sebaceous cyst: Secondary | ICD-10-CM | POA: Diagnosis not present

## 2015-12-28 DIAGNOSIS — F418 Other specified anxiety disorders: Secondary | ICD-10-CM | POA: Diagnosis not present

## 2015-12-28 DIAGNOSIS — G6289 Other specified polyneuropathies: Secondary | ICD-10-CM | POA: Diagnosis not present

## 2015-12-28 DIAGNOSIS — J440 Chronic obstructive pulmonary disease with acute lower respiratory infection: Secondary | ICD-10-CM | POA: Diagnosis not present

## 2015-12-28 DIAGNOSIS — R1084 Generalized abdominal pain: Secondary | ICD-10-CM | POA: Diagnosis not present

## 2015-12-28 DIAGNOSIS — L218 Other seborrheic dermatitis: Secondary | ICD-10-CM | POA: Diagnosis not present

## 2016-01-06 DIAGNOSIS — J440 Chronic obstructive pulmonary disease with acute lower respiratory infection: Secondary | ICD-10-CM | POA: Diagnosis not present

## 2016-01-06 DIAGNOSIS — D6489 Other specified anemias: Secondary | ICD-10-CM | POA: Diagnosis not present

## 2016-01-06 DIAGNOSIS — K5909 Other constipation: Secondary | ICD-10-CM | POA: Diagnosis not present

## 2016-01-06 DIAGNOSIS — K5669 Other intestinal obstruction: Secondary | ICD-10-CM | POA: Diagnosis not present

## 2016-01-10 DIAGNOSIS — F419 Anxiety disorder, unspecified: Secondary | ICD-10-CM | POA: Diagnosis not present

## 2016-01-10 DIAGNOSIS — F329 Major depressive disorder, single episode, unspecified: Secondary | ICD-10-CM | POA: Diagnosis not present

## 2016-01-11 DIAGNOSIS — R220 Localized swelling, mass and lump, head: Secondary | ICD-10-CM | POA: Diagnosis not present

## 2016-01-11 DIAGNOSIS — J440 Chronic obstructive pulmonary disease with acute lower respiratory infection: Secondary | ICD-10-CM | POA: Diagnosis not present

## 2016-01-11 DIAGNOSIS — K5909 Other constipation: Secondary | ICD-10-CM | POA: Diagnosis not present

## 2016-01-11 DIAGNOSIS — F418 Other specified anxiety disorders: Secondary | ICD-10-CM | POA: Diagnosis not present

## 2016-01-16 DIAGNOSIS — F320 Major depressive disorder, single episode, mild: Secondary | ICD-10-CM | POA: Diagnosis not present

## 2016-01-16 DIAGNOSIS — G6289 Other specified polyneuropathies: Secondary | ICD-10-CM | POA: Diagnosis not present

## 2016-01-16 DIAGNOSIS — R1084 Generalized abdominal pain: Secondary | ICD-10-CM | POA: Diagnosis not present

## 2016-01-16 DIAGNOSIS — J440 Chronic obstructive pulmonary disease with acute lower respiratory infection: Secondary | ICD-10-CM | POA: Diagnosis not present

## 2016-01-19 DIAGNOSIS — F419 Anxiety disorder, unspecified: Secondary | ICD-10-CM | POA: Diagnosis not present

## 2016-01-19 DIAGNOSIS — F329 Major depressive disorder, single episode, unspecified: Secondary | ICD-10-CM | POA: Diagnosis not present

## 2016-01-19 DIAGNOSIS — G4700 Insomnia, unspecified: Secondary | ICD-10-CM | POA: Diagnosis not present

## 2016-01-24 DIAGNOSIS — F329 Major depressive disorder, single episode, unspecified: Secondary | ICD-10-CM | POA: Diagnosis not present

## 2016-01-24 DIAGNOSIS — F419 Anxiety disorder, unspecified: Secondary | ICD-10-CM | POA: Diagnosis not present

## 2016-01-26 DIAGNOSIS — G6289 Other specified polyneuropathies: Secondary | ICD-10-CM | POA: Diagnosis not present

## 2016-01-26 DIAGNOSIS — F418 Other specified anxiety disorders: Secondary | ICD-10-CM | POA: Diagnosis not present

## 2016-01-26 DIAGNOSIS — J440 Chronic obstructive pulmonary disease with acute lower respiratory infection: Secondary | ICD-10-CM | POA: Diagnosis not present

## 2016-01-26 DIAGNOSIS — R1084 Generalized abdominal pain: Secondary | ICD-10-CM | POA: Diagnosis not present

## 2016-02-02 DIAGNOSIS — F419 Anxiety disorder, unspecified: Secondary | ICD-10-CM | POA: Diagnosis not present

## 2016-02-02 DIAGNOSIS — G4700 Insomnia, unspecified: Secondary | ICD-10-CM | POA: Diagnosis not present

## 2016-02-02 DIAGNOSIS — F329 Major depressive disorder, single episode, unspecified: Secondary | ICD-10-CM | POA: Diagnosis not present

## 2016-02-07 DIAGNOSIS — F0391 Unspecified dementia with behavioral disturbance: Secondary | ICD-10-CM | POA: Diagnosis not present

## 2016-02-07 DIAGNOSIS — F419 Anxiety disorder, unspecified: Secondary | ICD-10-CM | POA: Diagnosis not present

## 2016-02-07 DIAGNOSIS — F329 Major depressive disorder, single episode, unspecified: Secondary | ICD-10-CM | POA: Diagnosis not present

## 2016-02-08 DIAGNOSIS — J984 Other disorders of lung: Secondary | ICD-10-CM | POA: Diagnosis not present

## 2016-02-08 DIAGNOSIS — R441 Visual hallucinations: Secondary | ICD-10-CM | POA: Diagnosis not present

## 2016-02-08 DIAGNOSIS — F418 Other specified anxiety disorders: Secondary | ICD-10-CM | POA: Diagnosis not present

## 2016-02-08 DIAGNOSIS — F320 Major depressive disorder, single episode, mild: Secondary | ICD-10-CM | POA: Diagnosis not present

## 2016-02-09 DIAGNOSIS — R531 Weakness: Secondary | ICD-10-CM | POA: Diagnosis not present

## 2016-02-15 DIAGNOSIS — Z23 Encounter for immunization: Secondary | ICD-10-CM | POA: Diagnosis not present

## 2016-02-16 DIAGNOSIS — F329 Major depressive disorder, single episode, unspecified: Secondary | ICD-10-CM | POA: Diagnosis not present

## 2016-02-16 DIAGNOSIS — G4700 Insomnia, unspecified: Secondary | ICD-10-CM | POA: Diagnosis not present

## 2016-02-16 DIAGNOSIS — F419 Anxiety disorder, unspecified: Secondary | ICD-10-CM | POA: Diagnosis not present

## 2016-02-20 DIAGNOSIS — F329 Major depressive disorder, single episode, unspecified: Secondary | ICD-10-CM | POA: Diagnosis not present

## 2016-02-20 DIAGNOSIS — F0391 Unspecified dementia with behavioral disturbance: Secondary | ICD-10-CM | POA: Diagnosis not present

## 2016-02-20 DIAGNOSIS — F419 Anxiety disorder, unspecified: Secondary | ICD-10-CM | POA: Diagnosis not present

## 2016-02-23 DIAGNOSIS — G6289 Other specified polyneuropathies: Secondary | ICD-10-CM | POA: Diagnosis not present

## 2016-02-23 DIAGNOSIS — I10 Essential (primary) hypertension: Secondary | ICD-10-CM | POA: Diagnosis not present

## 2016-02-23 DIAGNOSIS — J984 Other disorders of lung: Secondary | ICD-10-CM | POA: Diagnosis not present

## 2016-02-23 DIAGNOSIS — F418 Other specified anxiety disorders: Secondary | ICD-10-CM | POA: Diagnosis not present

## 2016-02-24 DIAGNOSIS — I10 Essential (primary) hypertension: Secondary | ICD-10-CM | POA: Diagnosis not present

## 2016-02-24 DIAGNOSIS — R0602 Shortness of breath: Secondary | ICD-10-CM | POA: Diagnosis not present

## 2016-02-24 DIAGNOSIS — R918 Other nonspecific abnormal finding of lung field: Secondary | ICD-10-CM | POA: Diagnosis not present

## 2016-02-24 DIAGNOSIS — I638 Other cerebral infarction: Secondary | ICD-10-CM | POA: Diagnosis not present

## 2016-02-24 DIAGNOSIS — K5909 Other constipation: Secondary | ICD-10-CM | POA: Diagnosis not present

## 2016-02-27 DIAGNOSIS — D6489 Other specified anemias: Secondary | ICD-10-CM | POA: Diagnosis not present

## 2016-02-27 DIAGNOSIS — J189 Pneumonia, unspecified organism: Secondary | ICD-10-CM | POA: Diagnosis not present

## 2016-02-27 DIAGNOSIS — I10 Essential (primary) hypertension: Secondary | ICD-10-CM | POA: Diagnosis not present

## 2016-02-27 DIAGNOSIS — J984 Other disorders of lung: Secondary | ICD-10-CM | POA: Diagnosis not present

## 2016-02-29 DIAGNOSIS — G4700 Insomnia, unspecified: Secondary | ICD-10-CM | POA: Diagnosis not present

## 2016-02-29 DIAGNOSIS — F329 Major depressive disorder, single episode, unspecified: Secondary | ICD-10-CM | POA: Diagnosis not present

## 2016-02-29 DIAGNOSIS — F419 Anxiety disorder, unspecified: Secondary | ICD-10-CM | POA: Diagnosis not present

## 2016-03-13 DIAGNOSIS — Z8673 Personal history of transient ischemic attack (TIA), and cerebral infarction without residual deficits: Secondary | ICD-10-CM | POA: Diagnosis not present

## 2016-03-13 DIAGNOSIS — R1010 Upper abdominal pain, unspecified: Secondary | ICD-10-CM | POA: Diagnosis not present

## 2016-03-13 DIAGNOSIS — F419 Anxiety disorder, unspecified: Secondary | ICD-10-CM | POA: Diagnosis not present

## 2016-03-13 DIAGNOSIS — K219 Gastro-esophageal reflux disease without esophagitis: Secondary | ICD-10-CM | POA: Diagnosis not present

## 2016-03-13 DIAGNOSIS — I10 Essential (primary) hypertension: Secondary | ICD-10-CM | POA: Diagnosis not present

## 2016-03-13 DIAGNOSIS — J9811 Atelectasis: Secondary | ICD-10-CM | POA: Diagnosis not present

## 2016-03-13 DIAGNOSIS — R140 Abdominal distension (gaseous): Secondary | ICD-10-CM | POA: Diagnosis not present

## 2016-03-13 DIAGNOSIS — K5900 Constipation, unspecified: Secondary | ICD-10-CM | POA: Diagnosis not present

## 2016-03-13 DIAGNOSIS — Z888 Allergy status to other drugs, medicaments and biological substances status: Secondary | ICD-10-CM | POA: Diagnosis not present

## 2016-03-13 DIAGNOSIS — Z87891 Personal history of nicotine dependence: Secondary | ICD-10-CM | POA: Diagnosis not present

## 2016-03-13 DIAGNOSIS — Z79899 Other long term (current) drug therapy: Secondary | ICD-10-CM | POA: Diagnosis not present

## 2016-03-13 DIAGNOSIS — J449 Chronic obstructive pulmonary disease, unspecified: Secondary | ICD-10-CM | POA: Diagnosis not present

## 2016-03-14 DIAGNOSIS — R531 Weakness: Secondary | ICD-10-CM | POA: Diagnosis not present

## 2016-03-14 DIAGNOSIS — F329 Major depressive disorder, single episode, unspecified: Secondary | ICD-10-CM | POA: Diagnosis not present

## 2016-03-14 DIAGNOSIS — F419 Anxiety disorder, unspecified: Secondary | ICD-10-CM | POA: Diagnosis not present

## 2016-03-14 DIAGNOSIS — M6250 Muscle wasting and atrophy, not elsewhere classified, unspecified site: Secondary | ICD-10-CM | POA: Diagnosis not present

## 2016-03-14 DIAGNOSIS — F0391 Unspecified dementia with behavioral disturbance: Secondary | ICD-10-CM | POA: Diagnosis not present

## 2016-03-14 DIAGNOSIS — K5900 Constipation, unspecified: Secondary | ICD-10-CM | POA: Diagnosis not present

## 2016-03-14 DIAGNOSIS — R2990 Unspecified symptoms and signs involving the nervous system: Secondary | ICD-10-CM | POA: Diagnosis not present

## 2016-03-18 DIAGNOSIS — R531 Weakness: Secondary | ICD-10-CM | POA: Diagnosis not present

## 2016-03-18 DIAGNOSIS — I10 Essential (primary) hypertension: Secondary | ICD-10-CM | POA: Diagnosis not present

## 2016-03-18 DIAGNOSIS — M6250 Muscle wasting and atrophy, not elsewhere classified, unspecified site: Secondary | ICD-10-CM | POA: Diagnosis not present

## 2016-03-18 DIAGNOSIS — R0602 Shortness of breath: Secondary | ICD-10-CM | POA: Diagnosis not present

## 2016-03-18 DIAGNOSIS — R918 Other nonspecific abnormal finding of lung field: Secondary | ICD-10-CM | POA: Diagnosis not present

## 2016-03-18 DIAGNOSIS — R9431 Abnormal electrocardiogram [ECG] [EKG]: Secondary | ICD-10-CM | POA: Diagnosis not present

## 2016-03-18 DIAGNOSIS — F419 Anxiety disorder, unspecified: Secondary | ICD-10-CM | POA: Diagnosis not present

## 2016-03-18 DIAGNOSIS — Z79899 Other long term (current) drug therapy: Secondary | ICD-10-CM | POA: Diagnosis not present

## 2016-03-18 DIAGNOSIS — J984 Other disorders of lung: Secondary | ICD-10-CM | POA: Diagnosis not present

## 2016-03-18 DIAGNOSIS — R079 Chest pain, unspecified: Secondary | ICD-10-CM | POA: Diagnosis not present

## 2016-03-18 DIAGNOSIS — K219 Gastro-esophageal reflux disease without esophagitis: Secondary | ICD-10-CM | POA: Diagnosis not present

## 2016-03-18 DIAGNOSIS — I4510 Unspecified right bundle-branch block: Secondary | ICD-10-CM | POA: Diagnosis not present

## 2016-03-18 DIAGNOSIS — J441 Chronic obstructive pulmonary disease with (acute) exacerbation: Secondary | ICD-10-CM | POA: Diagnosis not present

## 2016-03-18 DIAGNOSIS — R5381 Other malaise: Secondary | ICD-10-CM | POA: Diagnosis not present

## 2016-03-18 DIAGNOSIS — J90 Pleural effusion, not elsewhere classified: Secondary | ICD-10-CM | POA: Diagnosis not present

## 2016-03-18 DIAGNOSIS — R2990 Unspecified symptoms and signs involving the nervous system: Secondary | ICD-10-CM | POA: Diagnosis not present

## 2016-03-18 DIAGNOSIS — F05 Delirium due to known physiological condition: Secondary | ICD-10-CM | POA: Diagnosis not present

## 2016-03-18 DIAGNOSIS — M791 Myalgia: Secondary | ICD-10-CM | POA: Diagnosis not present

## 2016-03-18 DIAGNOSIS — Z8673 Personal history of transient ischemic attack (TIA), and cerebral infarction without residual deficits: Secondary | ICD-10-CM | POA: Diagnosis not present

## 2016-03-18 DIAGNOSIS — Z87891 Personal history of nicotine dependence: Secondary | ICD-10-CM | POA: Diagnosis not present

## 2016-03-22 DIAGNOSIS — M6281 Muscle weakness (generalized): Secondary | ICD-10-CM | POA: Diagnosis not present

## 2016-03-22 DIAGNOSIS — R262 Difficulty in walking, not elsewhere classified: Secondary | ICD-10-CM | POA: Diagnosis not present

## 2016-03-22 DIAGNOSIS — Z741 Need for assistance with personal care: Secondary | ICD-10-CM | POA: Diagnosis not present

## 2016-03-22 DIAGNOSIS — R1084 Generalized abdominal pain: Secondary | ICD-10-CM | POA: Diagnosis not present

## 2016-03-22 DIAGNOSIS — Z7409 Other reduced mobility: Secondary | ICD-10-CM | POA: Diagnosis not present

## 2016-03-23 DIAGNOSIS — R1084 Generalized abdominal pain: Secondary | ICD-10-CM | POA: Diagnosis not present

## 2016-03-23 DIAGNOSIS — Z7409 Other reduced mobility: Secondary | ICD-10-CM | POA: Diagnosis not present

## 2016-03-23 DIAGNOSIS — Z741 Need for assistance with personal care: Secondary | ICD-10-CM | POA: Diagnosis not present

## 2016-03-23 DIAGNOSIS — M6281 Muscle weakness (generalized): Secondary | ICD-10-CM | POA: Diagnosis not present

## 2016-03-23 DIAGNOSIS — R262 Difficulty in walking, not elsewhere classified: Secondary | ICD-10-CM | POA: Diagnosis not present

## 2016-03-26 DIAGNOSIS — R262 Difficulty in walking, not elsewhere classified: Secondary | ICD-10-CM | POA: Diagnosis not present

## 2016-03-26 DIAGNOSIS — Z7409 Other reduced mobility: Secondary | ICD-10-CM | POA: Diagnosis not present

## 2016-03-26 DIAGNOSIS — M6281 Muscle weakness (generalized): Secondary | ICD-10-CM | POA: Diagnosis not present

## 2016-03-26 DIAGNOSIS — Z741 Need for assistance with personal care: Secondary | ICD-10-CM | POA: Diagnosis not present

## 2016-03-26 DIAGNOSIS — R1084 Generalized abdominal pain: Secondary | ICD-10-CM | POA: Diagnosis not present

## 2016-03-27 DIAGNOSIS — M6281 Muscle weakness (generalized): Secondary | ICD-10-CM | POA: Diagnosis not present

## 2016-03-27 DIAGNOSIS — Z7409 Other reduced mobility: Secondary | ICD-10-CM | POA: Diagnosis not present

## 2016-03-27 DIAGNOSIS — R262 Difficulty in walking, not elsewhere classified: Secondary | ICD-10-CM | POA: Diagnosis not present

## 2016-03-27 DIAGNOSIS — R1084 Generalized abdominal pain: Secondary | ICD-10-CM | POA: Diagnosis not present

## 2016-03-27 DIAGNOSIS — Z741 Need for assistance with personal care: Secondary | ICD-10-CM | POA: Diagnosis not present

## 2016-03-28 DIAGNOSIS — R1084 Generalized abdominal pain: Secondary | ICD-10-CM | POA: Diagnosis not present

## 2016-03-28 DIAGNOSIS — M6281 Muscle weakness (generalized): Secondary | ICD-10-CM | POA: Diagnosis not present

## 2016-03-28 DIAGNOSIS — Z741 Need for assistance with personal care: Secondary | ICD-10-CM | POA: Diagnosis not present

## 2016-03-28 DIAGNOSIS — R262 Difficulty in walking, not elsewhere classified: Secondary | ICD-10-CM | POA: Diagnosis not present

## 2016-03-28 DIAGNOSIS — Z7409 Other reduced mobility: Secondary | ICD-10-CM | POA: Diagnosis not present

## 2016-03-29 DIAGNOSIS — Z7409 Other reduced mobility: Secondary | ICD-10-CM | POA: Diagnosis not present

## 2016-03-29 DIAGNOSIS — G4700 Insomnia, unspecified: Secondary | ICD-10-CM | POA: Diagnosis not present

## 2016-03-29 DIAGNOSIS — F329 Major depressive disorder, single episode, unspecified: Secondary | ICD-10-CM | POA: Diagnosis not present

## 2016-03-29 DIAGNOSIS — R262 Difficulty in walking, not elsewhere classified: Secondary | ICD-10-CM | POA: Diagnosis not present

## 2016-03-29 DIAGNOSIS — M6281 Muscle weakness (generalized): Secondary | ICD-10-CM | POA: Diagnosis not present

## 2016-03-29 DIAGNOSIS — F419 Anxiety disorder, unspecified: Secondary | ICD-10-CM | POA: Diagnosis not present

## 2016-03-29 DIAGNOSIS — Z741 Need for assistance with personal care: Secondary | ICD-10-CM | POA: Diagnosis not present

## 2016-03-29 DIAGNOSIS — R1084 Generalized abdominal pain: Secondary | ICD-10-CM | POA: Diagnosis not present

## 2016-03-30 DIAGNOSIS — M6281 Muscle weakness (generalized): Secondary | ICD-10-CM | POA: Diagnosis not present

## 2016-03-30 DIAGNOSIS — Z741 Need for assistance with personal care: Secondary | ICD-10-CM | POA: Diagnosis not present

## 2016-03-30 DIAGNOSIS — R1084 Generalized abdominal pain: Secondary | ICD-10-CM | POA: Diagnosis not present

## 2016-03-30 DIAGNOSIS — R05 Cough: Secondary | ICD-10-CM | POA: Diagnosis not present

## 2016-03-30 DIAGNOSIS — R262 Difficulty in walking, not elsewhere classified: Secondary | ICD-10-CM | POA: Diagnosis not present

## 2016-03-30 DIAGNOSIS — K567 Ileus, unspecified: Secondary | ICD-10-CM | POA: Diagnosis not present

## 2016-03-30 DIAGNOSIS — R140 Abdominal distension (gaseous): Secondary | ICD-10-CM | POA: Diagnosis not present

## 2016-03-30 DIAGNOSIS — Z7409 Other reduced mobility: Secondary | ICD-10-CM | POA: Diagnosis not present

## 2016-04-03 DIAGNOSIS — Z7409 Other reduced mobility: Secondary | ICD-10-CM | POA: Diagnosis not present

## 2016-04-03 DIAGNOSIS — R262 Difficulty in walking, not elsewhere classified: Secondary | ICD-10-CM | POA: Diagnosis not present

## 2016-04-03 DIAGNOSIS — Z741 Need for assistance with personal care: Secondary | ICD-10-CM | POA: Diagnosis not present

## 2016-04-03 DIAGNOSIS — M6281 Muscle weakness (generalized): Secondary | ICD-10-CM | POA: Diagnosis not present

## 2016-04-03 DIAGNOSIS — R1084 Generalized abdominal pain: Secondary | ICD-10-CM | POA: Diagnosis not present

## 2016-04-04 DIAGNOSIS — R262 Difficulty in walking, not elsewhere classified: Secondary | ICD-10-CM | POA: Diagnosis not present

## 2016-04-04 DIAGNOSIS — Z741 Need for assistance with personal care: Secondary | ICD-10-CM | POA: Diagnosis not present

## 2016-04-04 DIAGNOSIS — Z7409 Other reduced mobility: Secondary | ICD-10-CM | POA: Diagnosis not present

## 2016-04-04 DIAGNOSIS — M6281 Muscle weakness (generalized): Secondary | ICD-10-CM | POA: Diagnosis not present

## 2016-04-04 DIAGNOSIS — R1084 Generalized abdominal pain: Secondary | ICD-10-CM | POA: Diagnosis not present

## 2016-04-07 DIAGNOSIS — R1084 Generalized abdominal pain: Secondary | ICD-10-CM | POA: Diagnosis not present

## 2016-04-07 DIAGNOSIS — K922 Gastrointestinal hemorrhage, unspecified: Secondary | ICD-10-CM | POA: Diagnosis not present

## 2016-04-07 DIAGNOSIS — J984 Other disorders of lung: Secondary | ICD-10-CM | POA: Diagnosis not present

## 2016-04-07 DIAGNOSIS — J069 Acute upper respiratory infection, unspecified: Secondary | ICD-10-CM | POA: Diagnosis not present

## 2016-04-07 DIAGNOSIS — F0390 Unspecified dementia without behavioral disturbance: Secondary | ICD-10-CM | POA: Diagnosis not present

## 2016-04-07 DIAGNOSIS — I4510 Unspecified right bundle-branch block: Secondary | ICD-10-CM | POA: Diagnosis not present

## 2016-04-07 DIAGNOSIS — K5939 Other megacolon: Secondary | ICD-10-CM | POA: Diagnosis not present

## 2016-04-07 DIAGNOSIS — N281 Cyst of kidney, acquired: Secondary | ICD-10-CM | POA: Diagnosis not present

## 2016-04-07 DIAGNOSIS — R1013 Epigastric pain: Secondary | ICD-10-CM | POA: Diagnosis not present

## 2016-04-07 DIAGNOSIS — E278 Other specified disorders of adrenal gland: Secondary | ICD-10-CM | POA: Diagnosis not present

## 2016-04-07 DIAGNOSIS — I10 Essential (primary) hypertension: Secondary | ICD-10-CM | POA: Diagnosis not present

## 2016-04-07 DIAGNOSIS — K56600 Partial intestinal obstruction, unspecified as to cause: Secondary | ICD-10-CM | POA: Diagnosis not present

## 2016-04-07 DIAGNOSIS — R9431 Abnormal electrocardiogram [ECG] [EKG]: Secondary | ICD-10-CM | POA: Diagnosis not present

## 2016-04-07 DIAGNOSIS — R0989 Other specified symptoms and signs involving the circulatory and respiratory systems: Secondary | ICD-10-CM | POA: Diagnosis not present

## 2016-04-07 DIAGNOSIS — K5900 Constipation, unspecified: Secondary | ICD-10-CM | POA: Diagnosis not present

## 2016-04-07 DIAGNOSIS — J449 Chronic obstructive pulmonary disease, unspecified: Secondary | ICD-10-CM | POA: Diagnosis not present

## 2016-04-07 DIAGNOSIS — R109 Unspecified abdominal pain: Secondary | ICD-10-CM | POA: Diagnosis not present

## 2016-04-07 DIAGNOSIS — J9611 Chronic respiratory failure with hypoxia: Secondary | ICD-10-CM | POA: Diagnosis not present

## 2016-04-07 DIAGNOSIS — K6389 Other specified diseases of intestine: Secondary | ICD-10-CM | POA: Diagnosis not present

## 2016-04-07 DIAGNOSIS — R918 Other nonspecific abnormal finding of lung field: Secondary | ICD-10-CM | POA: Diagnosis not present

## 2016-04-07 DIAGNOSIS — E872 Acidosis: Secondary | ICD-10-CM | POA: Diagnosis not present

## 2016-04-08 DIAGNOSIS — R140 Abdominal distension (gaseous): Secondary | ICD-10-CM | POA: Diagnosis not present

## 2016-04-08 DIAGNOSIS — R109 Unspecified abdominal pain: Secondary | ICD-10-CM | POA: Diagnosis not present

## 2016-04-08 DIAGNOSIS — R1013 Epigastric pain: Secondary | ICD-10-CM | POA: Diagnosis not present

## 2016-04-08 DIAGNOSIS — J9611 Chronic respiratory failure with hypoxia: Secondary | ICD-10-CM | POA: Diagnosis not present

## 2016-04-08 DIAGNOSIS — R933 Abnormal findings on diagnostic imaging of other parts of digestive tract: Secondary | ICD-10-CM | POA: Diagnosis not present

## 2016-04-09 DIAGNOSIS — Z741 Need for assistance with personal care: Secondary | ICD-10-CM | POA: Diagnosis not present

## 2016-04-09 DIAGNOSIS — K429 Umbilical hernia without obstruction or gangrene: Secondary | ICD-10-CM | POA: Diagnosis not present

## 2016-04-09 DIAGNOSIS — J449 Chronic obstructive pulmonary disease, unspecified: Secondary | ICD-10-CM | POA: Diagnosis present

## 2016-04-09 DIAGNOSIS — K219 Gastro-esophageal reflux disease without esophagitis: Secondary | ICD-10-CM | POA: Diagnosis present

## 2016-04-09 DIAGNOSIS — R109 Unspecified abdominal pain: Secondary | ICD-10-CM | POA: Diagnosis not present

## 2016-04-09 DIAGNOSIS — K56609 Unspecified intestinal obstruction, unspecified as to partial versus complete obstruction: Secondary | ICD-10-CM | POA: Diagnosis not present

## 2016-04-09 DIAGNOSIS — Z87891 Personal history of nicotine dependence: Secondary | ICD-10-CM | POA: Diagnosis not present

## 2016-04-09 DIAGNOSIS — K5900 Constipation, unspecified: Secondary | ICD-10-CM | POA: Diagnosis not present

## 2016-04-09 DIAGNOSIS — I10 Essential (primary) hypertension: Secondary | ICD-10-CM | POA: Diagnosis present

## 2016-04-09 DIAGNOSIS — M6281 Muscle weakness (generalized): Secondary | ICD-10-CM | POA: Diagnosis not present

## 2016-04-09 DIAGNOSIS — Z8673 Personal history of transient ischemic attack (TIA), and cerebral infarction without residual deficits: Secondary | ICD-10-CM | POA: Diagnosis not present

## 2016-04-09 DIAGNOSIS — R5381 Other malaise: Secondary | ICD-10-CM | POA: Diagnosis not present

## 2016-04-09 DIAGNOSIS — Z7409 Other reduced mobility: Secondary | ICD-10-CM | POA: Diagnosis not present

## 2016-04-09 DIAGNOSIS — F330 Major depressive disorder, recurrent, mild: Secondary | ICD-10-CM | POA: Diagnosis not present

## 2016-04-09 DIAGNOSIS — F329 Major depressive disorder, single episode, unspecified: Secondary | ICD-10-CM | POA: Diagnosis present

## 2016-04-09 DIAGNOSIS — J441 Chronic obstructive pulmonary disease with (acute) exacerbation: Secondary | ICD-10-CM | POA: Diagnosis not present

## 2016-04-09 DIAGNOSIS — R140 Abdominal distension (gaseous): Secondary | ICD-10-CM | POA: Diagnosis not present

## 2016-04-09 DIAGNOSIS — F0390 Unspecified dementia without behavioral disturbance: Secondary | ICD-10-CM | POA: Diagnosis present

## 2016-04-09 DIAGNOSIS — K5939 Other megacolon: Secondary | ICD-10-CM | POA: Diagnosis present

## 2016-04-09 DIAGNOSIS — K922 Gastrointestinal hemorrhage, unspecified: Secondary | ICD-10-CM | POA: Diagnosis not present

## 2016-04-09 DIAGNOSIS — Z7951 Long term (current) use of inhaled steroids: Secondary | ICD-10-CM | POA: Diagnosis not present

## 2016-04-09 DIAGNOSIS — D123 Benign neoplasm of transverse colon: Secondary | ICD-10-CM | POA: Diagnosis present

## 2016-04-09 DIAGNOSIS — R279 Unspecified lack of coordination: Secondary | ICD-10-CM | POA: Diagnosis not present

## 2016-04-09 DIAGNOSIS — F0151 Vascular dementia with behavioral disturbance: Secondary | ICD-10-CM | POA: Diagnosis not present

## 2016-04-09 DIAGNOSIS — Z01810 Encounter for preprocedural cardiovascular examination: Secondary | ICD-10-CM | POA: Diagnosis not present

## 2016-04-09 DIAGNOSIS — K56600 Partial intestinal obstruction, unspecified as to cause: Secondary | ICD-10-CM | POA: Diagnosis present

## 2016-04-09 DIAGNOSIS — F419 Anxiety disorder, unspecified: Secondary | ICD-10-CM | POA: Diagnosis not present

## 2016-04-09 DIAGNOSIS — Z01818 Encounter for other preprocedural examination: Secondary | ICD-10-CM | POA: Diagnosis not present

## 2016-04-09 DIAGNOSIS — D509 Iron deficiency anemia, unspecified: Secondary | ICD-10-CM | POA: Diagnosis present

## 2016-04-09 DIAGNOSIS — Z87898 Personal history of other specified conditions: Secondary | ICD-10-CM | POA: Diagnosis not present

## 2016-04-09 DIAGNOSIS — F410 Panic disorder [episodic paroxysmal anxiety] without agoraphobia: Secondary | ICD-10-CM | POA: Diagnosis present

## 2016-04-09 DIAGNOSIS — J439 Emphysema, unspecified: Secondary | ICD-10-CM | POA: Diagnosis not present

## 2016-04-09 DIAGNOSIS — K640 First degree hemorrhoids: Secondary | ICD-10-CM | POA: Diagnosis present

## 2016-04-09 DIAGNOSIS — D124 Benign neoplasm of descending colon: Secondary | ICD-10-CM | POA: Diagnosis present

## 2016-04-09 DIAGNOSIS — D125 Benign neoplasm of sigmoid colon: Secondary | ICD-10-CM | POA: Diagnosis present

## 2016-04-09 DIAGNOSIS — R938 Abnormal findings on diagnostic imaging of other specified body structures: Secondary | ICD-10-CM | POA: Diagnosis not present

## 2016-04-09 DIAGNOSIS — E872 Acidosis: Secondary | ICD-10-CM | POA: Diagnosis present

## 2016-04-09 DIAGNOSIS — K439 Ventral hernia without obstruction or gangrene: Secondary | ICD-10-CM | POA: Diagnosis present

## 2016-04-12 DIAGNOSIS — R5381 Other malaise: Secondary | ICD-10-CM | POA: Diagnosis not present

## 2016-04-12 DIAGNOSIS — R140 Abdominal distension (gaseous): Secondary | ICD-10-CM | POA: Diagnosis not present

## 2016-04-12 DIAGNOSIS — K56699 Other intestinal obstruction unspecified as to partial versus complete obstruction: Secondary | ICD-10-CM | POA: Diagnosis not present

## 2016-04-12 DIAGNOSIS — Z7409 Other reduced mobility: Secondary | ICD-10-CM | POA: Diagnosis not present

## 2016-04-12 DIAGNOSIS — F0151 Vascular dementia with behavioral disturbance: Secondary | ICD-10-CM | POA: Diagnosis not present

## 2016-04-12 DIAGNOSIS — F330 Major depressive disorder, recurrent, mild: Secondary | ICD-10-CM | POA: Diagnosis not present

## 2016-04-12 DIAGNOSIS — K56609 Unspecified intestinal obstruction, unspecified as to partial versus complete obstruction: Secondary | ICD-10-CM | POA: Diagnosis not present

## 2016-04-12 DIAGNOSIS — K5900 Constipation, unspecified: Secondary | ICD-10-CM | POA: Diagnosis not present

## 2016-04-12 DIAGNOSIS — Z7951 Long term (current) use of inhaled steroids: Secondary | ICD-10-CM | POA: Diagnosis not present

## 2016-04-12 DIAGNOSIS — J3489 Other specified disorders of nose and nasal sinuses: Secondary | ICD-10-CM | POA: Diagnosis not present

## 2016-04-12 DIAGNOSIS — K56600 Partial intestinal obstruction, unspecified as to cause: Secondary | ICD-10-CM | POA: Diagnosis not present

## 2016-04-12 DIAGNOSIS — Z79899 Other long term (current) drug therapy: Secondary | ICD-10-CM | POA: Diagnosis not present

## 2016-04-12 DIAGNOSIS — J449 Chronic obstructive pulmonary disease, unspecified: Secondary | ICD-10-CM | POA: Diagnosis not present

## 2016-04-12 DIAGNOSIS — K5909 Other constipation: Secondary | ICD-10-CM | POA: Diagnosis not present

## 2016-04-12 DIAGNOSIS — F0390 Unspecified dementia without behavioral disturbance: Secondary | ICD-10-CM | POA: Diagnosis not present

## 2016-04-12 DIAGNOSIS — F419 Anxiety disorder, unspecified: Secondary | ICD-10-CM | POA: Diagnosis not present

## 2016-04-12 DIAGNOSIS — R1031 Right lower quadrant pain: Secondary | ICD-10-CM | POA: Diagnosis not present

## 2016-04-12 DIAGNOSIS — J441 Chronic obstructive pulmonary disease with (acute) exacerbation: Secondary | ICD-10-CM | POA: Diagnosis not present

## 2016-04-12 DIAGNOSIS — J439 Emphysema, unspecified: Secondary | ICD-10-CM | POA: Diagnosis not present

## 2016-04-12 DIAGNOSIS — I10 Essential (primary) hypertension: Secondary | ICD-10-CM | POA: Diagnosis not present

## 2016-04-12 DIAGNOSIS — Z87891 Personal history of nicotine dependence: Secondary | ICD-10-CM | POA: Diagnosis not present

## 2016-04-12 DIAGNOSIS — R079 Chest pain, unspecified: Secondary | ICD-10-CM | POA: Diagnosis not present

## 2016-04-12 DIAGNOSIS — R0602 Shortness of breath: Secondary | ICD-10-CM | POA: Diagnosis not present

## 2016-04-12 DIAGNOSIS — I4510 Unspecified right bundle-branch block: Secondary | ICD-10-CM | POA: Diagnosis not present

## 2016-04-12 DIAGNOSIS — R1084 Generalized abdominal pain: Secondary | ICD-10-CM | POA: Diagnosis not present

## 2016-04-12 DIAGNOSIS — F418 Other specified anxiety disorders: Secondary | ICD-10-CM | POA: Diagnosis not present

## 2016-04-12 DIAGNOSIS — R279 Unspecified lack of coordination: Secondary | ICD-10-CM | POA: Diagnosis not present

## 2016-04-12 DIAGNOSIS — R109 Unspecified abdominal pain: Secondary | ICD-10-CM | POA: Diagnosis not present

## 2016-04-12 DIAGNOSIS — K219 Gastro-esophageal reflux disease without esophagitis: Secondary | ICD-10-CM | POA: Diagnosis not present

## 2016-04-12 DIAGNOSIS — Z741 Need for assistance with personal care: Secondary | ICD-10-CM | POA: Diagnosis not present

## 2016-04-12 DIAGNOSIS — K922 Gastrointestinal hemorrhage, unspecified: Secondary | ICD-10-CM | POA: Diagnosis not present

## 2016-04-12 DIAGNOSIS — M6281 Muscle weakness (generalized): Secondary | ICD-10-CM | POA: Diagnosis not present

## 2016-04-12 DIAGNOSIS — K1120 Sialoadenitis, unspecified: Secondary | ICD-10-CM | POA: Diagnosis not present

## 2016-04-12 DIAGNOSIS — F329 Major depressive disorder, single episode, unspecified: Secondary | ICD-10-CM | POA: Diagnosis not present

## 2016-04-13 DIAGNOSIS — K5909 Other constipation: Secondary | ICD-10-CM | POA: Diagnosis not present

## 2016-04-13 DIAGNOSIS — J449 Chronic obstructive pulmonary disease, unspecified: Secondary | ICD-10-CM | POA: Diagnosis not present

## 2016-04-13 DIAGNOSIS — K922 Gastrointestinal hemorrhage, unspecified: Secondary | ICD-10-CM | POA: Diagnosis not present

## 2016-04-13 DIAGNOSIS — F418 Other specified anxiety disorders: Secondary | ICD-10-CM | POA: Diagnosis not present

## 2016-04-13 DIAGNOSIS — J441 Chronic obstructive pulmonary disease with (acute) exacerbation: Secondary | ICD-10-CM | POA: Diagnosis not present

## 2016-04-13 DIAGNOSIS — I10 Essential (primary) hypertension: Secondary | ICD-10-CM | POA: Diagnosis not present

## 2016-04-13 DIAGNOSIS — K56609 Unspecified intestinal obstruction, unspecified as to partial versus complete obstruction: Secondary | ICD-10-CM | POA: Diagnosis not present

## 2016-04-13 DIAGNOSIS — K1120 Sialoadenitis, unspecified: Secondary | ICD-10-CM | POA: Diagnosis not present

## 2016-04-15 DIAGNOSIS — R109 Unspecified abdominal pain: Secondary | ICD-10-CM | POA: Diagnosis not present

## 2016-04-15 DIAGNOSIS — Z79899 Other long term (current) drug therapy: Secondary | ICD-10-CM | POA: Diagnosis not present

## 2016-04-15 DIAGNOSIS — F329 Major depressive disorder, single episode, unspecified: Secondary | ICD-10-CM | POA: Diagnosis not present

## 2016-04-15 DIAGNOSIS — R1031 Right lower quadrant pain: Secondary | ICD-10-CM | POA: Diagnosis not present

## 2016-04-15 DIAGNOSIS — R0602 Shortness of breath: Secondary | ICD-10-CM | POA: Diagnosis not present

## 2016-04-15 DIAGNOSIS — K219 Gastro-esophageal reflux disease without esophagitis: Secondary | ICD-10-CM | POA: Diagnosis not present

## 2016-04-15 DIAGNOSIS — R1084 Generalized abdominal pain: Secondary | ICD-10-CM | POA: Diagnosis not present

## 2016-04-15 DIAGNOSIS — K56699 Other intestinal obstruction unspecified as to partial versus complete obstruction: Secondary | ICD-10-CM | POA: Diagnosis not present

## 2016-04-15 DIAGNOSIS — I4510 Unspecified right bundle-branch block: Secondary | ICD-10-CM | POA: Diagnosis not present

## 2016-04-15 DIAGNOSIS — F419 Anxiety disorder, unspecified: Secondary | ICD-10-CM | POA: Diagnosis not present

## 2016-04-15 DIAGNOSIS — F0390 Unspecified dementia without behavioral disturbance: Secondary | ICD-10-CM | POA: Diagnosis not present

## 2016-04-15 DIAGNOSIS — I10 Essential (primary) hypertension: Secondary | ICD-10-CM | POA: Diagnosis not present

## 2016-04-15 DIAGNOSIS — K56609 Unspecified intestinal obstruction, unspecified as to partial versus complete obstruction: Secondary | ICD-10-CM | POA: Diagnosis not present

## 2016-04-15 DIAGNOSIS — R140 Abdominal distension (gaseous): Secondary | ICD-10-CM | POA: Diagnosis not present

## 2016-04-15 DIAGNOSIS — J449 Chronic obstructive pulmonary disease, unspecified: Secondary | ICD-10-CM | POA: Diagnosis not present

## 2016-04-15 DIAGNOSIS — K1120 Sialoadenitis, unspecified: Secondary | ICD-10-CM | POA: Diagnosis not present

## 2016-04-16 DIAGNOSIS — I10 Essential (primary) hypertension: Secondary | ICD-10-CM | POA: Diagnosis not present

## 2016-04-16 DIAGNOSIS — K5900 Constipation, unspecified: Secondary | ICD-10-CM | POA: Diagnosis not present

## 2016-04-16 DIAGNOSIS — K219 Gastro-esophageal reflux disease without esophagitis: Secondary | ICD-10-CM | POA: Diagnosis not present

## 2016-04-16 DIAGNOSIS — R1084 Generalized abdominal pain: Secondary | ICD-10-CM | POA: Diagnosis not present

## 2016-04-16 DIAGNOSIS — D509 Iron deficiency anemia, unspecified: Secondary | ICD-10-CM | POA: Diagnosis not present

## 2016-04-16 DIAGNOSIS — F329 Major depressive disorder, single episode, unspecified: Secondary | ICD-10-CM | POA: Diagnosis not present

## 2016-04-16 DIAGNOSIS — J449 Chronic obstructive pulmonary disease, unspecified: Secondary | ICD-10-CM | POA: Diagnosis not present

## 2016-04-17 DIAGNOSIS — G9389 Other specified disorders of brain: Secondary | ICD-10-CM | POA: Diagnosis not present

## 2016-04-17 DIAGNOSIS — F419 Anxiety disorder, unspecified: Secondary | ICD-10-CM | POA: Diagnosis not present

## 2016-04-17 DIAGNOSIS — R220 Localized swelling, mass and lump, head: Secondary | ICD-10-CM | POA: Diagnosis not present

## 2016-04-17 DIAGNOSIS — R1013 Epigastric pain: Secondary | ICD-10-CM | POA: Diagnosis not present

## 2016-04-17 DIAGNOSIS — J449 Chronic obstructive pulmonary disease, unspecified: Secondary | ICD-10-CM | POA: Diagnosis not present

## 2016-04-17 DIAGNOSIS — R930 Abnormal findings on diagnostic imaging of skull and head, not elsewhere classified: Secondary | ICD-10-CM | POA: Diagnosis not present

## 2016-04-17 DIAGNOSIS — D509 Iron deficiency anemia, unspecified: Secondary | ICD-10-CM | POA: Diagnosis not present

## 2016-04-17 DIAGNOSIS — F0390 Unspecified dementia without behavioral disturbance: Secondary | ICD-10-CM | POA: Diagnosis not present

## 2016-04-17 DIAGNOSIS — R590 Localized enlarged lymph nodes: Secondary | ICD-10-CM | POA: Diagnosis not present

## 2016-04-17 DIAGNOSIS — Z8673 Personal history of transient ischemic attack (TIA), and cerebral infarction without residual deficits: Secondary | ICD-10-CM | POA: Diagnosis not present

## 2016-04-18 DIAGNOSIS — R0600 Dyspnea, unspecified: Secondary | ICD-10-CM | POA: Diagnosis not present

## 2016-04-18 DIAGNOSIS — G4700 Insomnia, unspecified: Secondary | ICD-10-CM | POA: Diagnosis not present

## 2016-04-18 DIAGNOSIS — Z888 Allergy status to other drugs, medicaments and biological substances status: Secondary | ICD-10-CM | POA: Diagnosis not present

## 2016-04-18 DIAGNOSIS — J90 Pleural effusion, not elsewhere classified: Secondary | ICD-10-CM | POA: Diagnosis not present

## 2016-04-18 DIAGNOSIS — R0789 Other chest pain: Secondary | ICD-10-CM | POA: Diagnosis not present

## 2016-04-18 DIAGNOSIS — Z736 Limitation of activities due to disability: Secondary | ICD-10-CM | POA: Diagnosis not present

## 2016-04-18 DIAGNOSIS — J929 Pleural plaque without asbestos: Secondary | ICD-10-CM | POA: Diagnosis not present

## 2016-04-18 DIAGNOSIS — R918 Other nonspecific abnormal finding of lung field: Secondary | ICD-10-CM | POA: Diagnosis not present

## 2016-04-18 DIAGNOSIS — R2990 Unspecified symptoms and signs involving the nervous system: Secondary | ICD-10-CM | POA: Diagnosis not present

## 2016-04-18 DIAGNOSIS — R109 Unspecified abdominal pain: Secondary | ICD-10-CM | POA: Diagnosis not present

## 2016-04-18 DIAGNOSIS — J849 Interstitial pulmonary disease, unspecified: Secondary | ICD-10-CM | POA: Diagnosis not present

## 2016-04-18 DIAGNOSIS — Z8673 Personal history of transient ischemic attack (TIA), and cerebral infarction without residual deficits: Secondary | ICD-10-CM | POA: Diagnosis not present

## 2016-04-18 DIAGNOSIS — F0390 Unspecified dementia without behavioral disturbance: Secondary | ICD-10-CM | POA: Diagnosis not present

## 2016-04-18 DIAGNOSIS — K1120 Sialoadenitis, unspecified: Secondary | ICD-10-CM | POA: Diagnosis not present

## 2016-04-18 DIAGNOSIS — R609 Edema, unspecified: Secondary | ICD-10-CM | POA: Diagnosis not present

## 2016-04-18 DIAGNOSIS — R079 Chest pain, unspecified: Secondary | ICD-10-CM | POA: Diagnosis not present

## 2016-04-18 DIAGNOSIS — R2689 Other abnormalities of gait and mobility: Secondary | ICD-10-CM | POA: Diagnosis not present

## 2016-04-18 DIAGNOSIS — R140 Abdominal distension (gaseous): Secondary | ICD-10-CM | POA: Diagnosis not present

## 2016-04-18 DIAGNOSIS — K439 Ventral hernia without obstruction or gangrene: Secondary | ICD-10-CM | POA: Diagnosis not present

## 2016-04-18 DIAGNOSIS — R279 Unspecified lack of coordination: Secondary | ICD-10-CM | POA: Diagnosis not present

## 2016-04-18 DIAGNOSIS — R5381 Other malaise: Secondary | ICD-10-CM | POA: Diagnosis not present

## 2016-04-18 DIAGNOSIS — K56609 Unspecified intestinal obstruction, unspecified as to partial versus complete obstruction: Secondary | ICD-10-CM | POA: Diagnosis not present

## 2016-04-18 DIAGNOSIS — K5900 Constipation, unspecified: Secondary | ICD-10-CM | POA: Diagnosis not present

## 2016-04-18 DIAGNOSIS — R093 Abnormal sputum: Secondary | ICD-10-CM | POA: Diagnosis not present

## 2016-04-18 DIAGNOSIS — Z9114 Patient's other noncompliance with medication regimen: Secondary | ICD-10-CM | POA: Diagnosis not present

## 2016-04-18 DIAGNOSIS — K5909 Other constipation: Secondary | ICD-10-CM | POA: Diagnosis not present

## 2016-04-18 DIAGNOSIS — R0602 Shortness of breath: Secondary | ICD-10-CM | POA: Diagnosis not present

## 2016-04-18 DIAGNOSIS — F0391 Unspecified dementia with behavioral disturbance: Secondary | ICD-10-CM | POA: Diagnosis not present

## 2016-04-18 DIAGNOSIS — J441 Chronic obstructive pulmonary disease with (acute) exacerbation: Secondary | ICD-10-CM | POA: Diagnosis not present

## 2016-04-18 DIAGNOSIS — J439 Emphysema, unspecified: Secondary | ICD-10-CM | POA: Diagnosis not present

## 2016-04-18 DIAGNOSIS — Z87891 Personal history of nicotine dependence: Secondary | ICD-10-CM | POA: Diagnosis not present

## 2016-04-18 DIAGNOSIS — J339 Nasal polyp, unspecified: Secondary | ICD-10-CM | POA: Diagnosis not present

## 2016-04-18 DIAGNOSIS — F411 Generalized anxiety disorder: Secondary | ICD-10-CM | POA: Diagnosis not present

## 2016-04-18 DIAGNOSIS — F330 Major depressive disorder, recurrent, mild: Secondary | ICD-10-CM | POA: Diagnosis not present

## 2016-04-18 DIAGNOSIS — K429 Umbilical hernia without obstruction or gangrene: Secondary | ICD-10-CM | POA: Diagnosis not present

## 2016-04-18 DIAGNOSIS — Z9981 Dependence on supplemental oxygen: Secondary | ICD-10-CM | POA: Diagnosis not present

## 2016-04-18 DIAGNOSIS — R000 Tachycardia, unspecified: Secondary | ICD-10-CM | POA: Diagnosis not present

## 2016-04-18 DIAGNOSIS — M6281 Muscle weakness (generalized): Secondary | ICD-10-CM | POA: Diagnosis not present

## 2016-04-18 DIAGNOSIS — I4510 Unspecified right bundle-branch block: Secondary | ICD-10-CM | POA: Diagnosis not present

## 2016-04-18 DIAGNOSIS — R9431 Abnormal electrocardiogram [ECG] [EKG]: Secondary | ICD-10-CM | POA: Diagnosis not present

## 2016-04-18 DIAGNOSIS — K219 Gastro-esophageal reflux disease without esophagitis: Secondary | ICD-10-CM | POA: Diagnosis not present

## 2016-04-18 DIAGNOSIS — F0151 Vascular dementia with behavioral disturbance: Secondary | ICD-10-CM | POA: Diagnosis not present

## 2016-04-18 DIAGNOSIS — J449 Chronic obstructive pulmonary disease, unspecified: Secondary | ICD-10-CM | POA: Diagnosis not present

## 2016-04-18 DIAGNOSIS — J9811 Atelectasis: Secondary | ICD-10-CM | POA: Diagnosis not present

## 2016-04-18 DIAGNOSIS — K922 Gastrointestinal hemorrhage, unspecified: Secondary | ICD-10-CM | POA: Diagnosis not present

## 2016-04-18 DIAGNOSIS — R531 Weakness: Secondary | ICD-10-CM | POA: Diagnosis not present

## 2016-04-18 DIAGNOSIS — R1013 Epigastric pain: Secondary | ICD-10-CM | POA: Diagnosis not present

## 2016-04-18 DIAGNOSIS — F418 Other specified anxiety disorders: Secondary | ICD-10-CM | POA: Diagnosis not present

## 2016-04-18 DIAGNOSIS — Z79899 Other long term (current) drug therapy: Secondary | ICD-10-CM | POA: Diagnosis not present

## 2016-04-18 DIAGNOSIS — F419 Anxiety disorder, unspecified: Secondary | ICD-10-CM | POA: Diagnosis not present

## 2016-04-18 DIAGNOSIS — M6250 Muscle wasting and atrophy, not elsewhere classified, unspecified site: Secondary | ICD-10-CM | POA: Diagnosis not present

## 2016-04-18 DIAGNOSIS — Z741 Need for assistance with personal care: Secondary | ICD-10-CM | POA: Diagnosis not present

## 2016-04-18 DIAGNOSIS — F329 Major depressive disorder, single episode, unspecified: Secondary | ICD-10-CM | POA: Diagnosis not present

## 2016-04-18 DIAGNOSIS — F068 Other specified mental disorders due to known physiological condition: Secondary | ICD-10-CM | POA: Diagnosis not present

## 2016-04-18 DIAGNOSIS — I10 Essential (primary) hypertension: Secondary | ICD-10-CM | POA: Diagnosis not present

## 2016-04-18 DIAGNOSIS — K599 Functional intestinal disorder, unspecified: Secondary | ICD-10-CM | POA: Diagnosis not present

## 2016-04-18 DIAGNOSIS — Z7409 Other reduced mobility: Secondary | ICD-10-CM | POA: Diagnosis not present

## 2016-04-20 DIAGNOSIS — F418 Other specified anxiety disorders: Secondary | ICD-10-CM | POA: Diagnosis not present

## 2016-04-20 DIAGNOSIS — K1120 Sialoadenitis, unspecified: Secondary | ICD-10-CM | POA: Diagnosis not present

## 2016-04-20 DIAGNOSIS — K5909 Other constipation: Secondary | ICD-10-CM | POA: Diagnosis not present

## 2016-04-20 DIAGNOSIS — J449 Chronic obstructive pulmonary disease, unspecified: Secondary | ICD-10-CM | POA: Diagnosis not present

## 2016-04-20 DIAGNOSIS — I10 Essential (primary) hypertension: Secondary | ICD-10-CM | POA: Diagnosis not present

## 2016-04-20 DIAGNOSIS — K56609 Unspecified intestinal obstruction, unspecified as to partial versus complete obstruction: Secondary | ICD-10-CM | POA: Diagnosis not present

## 2016-04-20 DIAGNOSIS — K922 Gastrointestinal hemorrhage, unspecified: Secondary | ICD-10-CM | POA: Diagnosis not present

## 2016-04-20 DIAGNOSIS — J441 Chronic obstructive pulmonary disease with (acute) exacerbation: Secondary | ICD-10-CM | POA: Diagnosis not present

## 2016-04-23 DIAGNOSIS — R000 Tachycardia, unspecified: Secondary | ICD-10-CM | POA: Diagnosis not present

## 2016-04-23 DIAGNOSIS — J441 Chronic obstructive pulmonary disease with (acute) exacerbation: Secondary | ICD-10-CM | POA: Diagnosis not present

## 2016-04-23 DIAGNOSIS — K5909 Other constipation: Secondary | ICD-10-CM | POA: Diagnosis not present

## 2016-04-23 DIAGNOSIS — R9431 Abnormal electrocardiogram [ECG] [EKG]: Secondary | ICD-10-CM | POA: Diagnosis not present

## 2016-04-23 DIAGNOSIS — K429 Umbilical hernia without obstruction or gangrene: Secondary | ICD-10-CM | POA: Diagnosis not present

## 2016-04-23 DIAGNOSIS — K439 Ventral hernia without obstruction or gangrene: Secondary | ICD-10-CM | POA: Diagnosis not present

## 2016-04-23 DIAGNOSIS — J849 Interstitial pulmonary disease, unspecified: Secondary | ICD-10-CM | POA: Diagnosis not present

## 2016-04-23 DIAGNOSIS — K5900 Constipation, unspecified: Secondary | ICD-10-CM | POA: Diagnosis not present

## 2016-04-23 DIAGNOSIS — J439 Emphysema, unspecified: Secondary | ICD-10-CM | POA: Diagnosis not present

## 2016-04-23 DIAGNOSIS — R093 Abnormal sputum: Secondary | ICD-10-CM | POA: Diagnosis not present

## 2016-04-23 DIAGNOSIS — I4510 Unspecified right bundle-branch block: Secondary | ICD-10-CM | POA: Diagnosis not present

## 2016-04-23 DIAGNOSIS — R0600 Dyspnea, unspecified: Secondary | ICD-10-CM | POA: Diagnosis not present

## 2016-04-23 DIAGNOSIS — R0602 Shortness of breath: Secondary | ICD-10-CM | POA: Diagnosis not present

## 2016-04-30 DIAGNOSIS — F419 Anxiety disorder, unspecified: Secondary | ICD-10-CM | POA: Diagnosis not present

## 2016-04-30 DIAGNOSIS — F329 Major depressive disorder, single episode, unspecified: Secondary | ICD-10-CM | POA: Diagnosis not present

## 2016-04-30 DIAGNOSIS — G4700 Insomnia, unspecified: Secondary | ICD-10-CM | POA: Diagnosis not present

## 2016-05-11 DIAGNOSIS — F0390 Unspecified dementia without behavioral disturbance: Secondary | ICD-10-CM | POA: Diagnosis not present

## 2016-05-11 DIAGNOSIS — R0789 Other chest pain: Secondary | ICD-10-CM | POA: Diagnosis not present

## 2016-05-11 DIAGNOSIS — J929 Pleural plaque without asbestos: Secondary | ICD-10-CM | POA: Diagnosis not present

## 2016-05-11 DIAGNOSIS — R079 Chest pain, unspecified: Secondary | ICD-10-CM | POA: Diagnosis not present

## 2016-05-11 DIAGNOSIS — I10 Essential (primary) hypertension: Secondary | ICD-10-CM | POA: Diagnosis not present

## 2016-05-11 DIAGNOSIS — K5900 Constipation, unspecified: Secondary | ICD-10-CM | POA: Diagnosis not present

## 2016-05-11 DIAGNOSIS — J449 Chronic obstructive pulmonary disease, unspecified: Secondary | ICD-10-CM | POA: Diagnosis not present

## 2016-05-11 DIAGNOSIS — R918 Other nonspecific abnormal finding of lung field: Secondary | ICD-10-CM | POA: Diagnosis not present

## 2016-05-11 DIAGNOSIS — I4510 Unspecified right bundle-branch block: Secondary | ICD-10-CM | POA: Diagnosis not present

## 2016-05-11 DIAGNOSIS — R9431 Abnormal electrocardiogram [ECG] [EKG]: Secondary | ICD-10-CM | POA: Diagnosis not present

## 2016-05-11 DIAGNOSIS — R000 Tachycardia, unspecified: Secondary | ICD-10-CM | POA: Diagnosis not present

## 2016-05-28 DIAGNOSIS — F329 Major depressive disorder, single episode, unspecified: Secondary | ICD-10-CM | POA: Diagnosis not present

## 2016-05-28 DIAGNOSIS — F419 Anxiety disorder, unspecified: Secondary | ICD-10-CM | POA: Diagnosis not present

## 2016-05-28 DIAGNOSIS — G4700 Insomnia, unspecified: Secondary | ICD-10-CM | POA: Diagnosis not present

## 2016-06-01 DIAGNOSIS — K5900 Constipation, unspecified: Secondary | ICD-10-CM | POA: Diagnosis not present

## 2016-06-01 DIAGNOSIS — Z9114 Patient's other noncompliance with medication regimen: Secondary | ICD-10-CM | POA: Diagnosis not present

## 2016-06-12 DIAGNOSIS — Z736 Limitation of activities due to disability: Secondary | ICD-10-CM | POA: Diagnosis not present

## 2016-06-12 DIAGNOSIS — J449 Chronic obstructive pulmonary disease, unspecified: Secondary | ICD-10-CM | POA: Diagnosis not present

## 2016-06-12 DIAGNOSIS — K599 Functional intestinal disorder, unspecified: Secondary | ICD-10-CM | POA: Diagnosis not present

## 2016-06-12 DIAGNOSIS — Z9981 Dependence on supplemental oxygen: Secondary | ICD-10-CM | POA: Diagnosis not present

## 2016-06-18 DIAGNOSIS — F419 Anxiety disorder, unspecified: Secondary | ICD-10-CM | POA: Diagnosis not present

## 2016-06-18 DIAGNOSIS — J9811 Atelectasis: Secondary | ICD-10-CM | POA: Diagnosis not present

## 2016-06-18 DIAGNOSIS — Z87891 Personal history of nicotine dependence: Secondary | ICD-10-CM | POA: Diagnosis not present

## 2016-06-18 DIAGNOSIS — R918 Other nonspecific abnormal finding of lung field: Secondary | ICD-10-CM | POA: Diagnosis not present

## 2016-06-18 DIAGNOSIS — J439 Emphysema, unspecified: Secondary | ICD-10-CM | POA: Diagnosis not present

## 2016-06-18 DIAGNOSIS — J441 Chronic obstructive pulmonary disease with (acute) exacerbation: Secondary | ICD-10-CM | POA: Diagnosis not present

## 2016-06-18 DIAGNOSIS — J90 Pleural effusion, not elsewhere classified: Secondary | ICD-10-CM | POA: Diagnosis not present

## 2016-06-18 DIAGNOSIS — R609 Edema, unspecified: Secondary | ICD-10-CM | POA: Diagnosis not present

## 2016-06-18 DIAGNOSIS — I10 Essential (primary) hypertension: Secondary | ICD-10-CM | POA: Diagnosis not present

## 2016-06-18 DIAGNOSIS — R0602 Shortness of breath: Secondary | ICD-10-CM | POA: Diagnosis not present

## 2016-06-18 DIAGNOSIS — J339 Nasal polyp, unspecified: Secondary | ICD-10-CM | POA: Diagnosis not present

## 2016-06-20 DIAGNOSIS — I10 Essential (primary) hypertension: Secondary | ICD-10-CM | POA: Diagnosis not present

## 2016-06-20 DIAGNOSIS — J449 Chronic obstructive pulmonary disease, unspecified: Secondary | ICD-10-CM | POA: Diagnosis not present

## 2016-06-20 DIAGNOSIS — K5909 Other constipation: Secondary | ICD-10-CM | POA: Diagnosis not present

## 2016-06-20 DIAGNOSIS — J9610 Chronic respiratory failure, unspecified whether with hypoxia or hypercapnia: Secondary | ICD-10-CM | POA: Diagnosis not present

## 2016-06-21 DIAGNOSIS — F329 Major depressive disorder, single episode, unspecified: Secondary | ICD-10-CM | POA: Diagnosis not present

## 2016-06-21 DIAGNOSIS — F419 Anxiety disorder, unspecified: Secondary | ICD-10-CM | POA: Diagnosis not present

## 2016-06-28 DIAGNOSIS — J441 Chronic obstructive pulmonary disease with (acute) exacerbation: Secondary | ICD-10-CM | POA: Diagnosis not present

## 2016-06-28 DIAGNOSIS — J439 Emphysema, unspecified: Secondary | ICD-10-CM | POA: Diagnosis not present

## 2016-06-28 DIAGNOSIS — K5900 Constipation, unspecified: Secondary | ICD-10-CM | POA: Diagnosis not present

## 2016-06-28 DIAGNOSIS — R000 Tachycardia, unspecified: Secondary | ICD-10-CM | POA: Diagnosis not present

## 2016-06-28 DIAGNOSIS — Z8673 Personal history of transient ischemic attack (TIA), and cerebral infarction without residual deficits: Secondary | ICD-10-CM | POA: Diagnosis not present

## 2016-06-28 DIAGNOSIS — D509 Iron deficiency anemia, unspecified: Secondary | ICD-10-CM | POA: Diagnosis not present

## 2016-06-28 DIAGNOSIS — K920 Hematemesis: Secondary | ICD-10-CM | POA: Diagnosis not present

## 2016-06-28 DIAGNOSIS — F0390 Unspecified dementia without behavioral disturbance: Secondary | ICD-10-CM | POA: Diagnosis not present

## 2016-06-28 DIAGNOSIS — I10 Essential (primary) hypertension: Secondary | ICD-10-CM | POA: Diagnosis not present

## 2016-06-28 DIAGNOSIS — G4700 Insomnia, unspecified: Secondary | ICD-10-CM | POA: Diagnosis not present

## 2016-06-28 DIAGNOSIS — F329 Major depressive disorder, single episode, unspecified: Secondary | ICD-10-CM | POA: Diagnosis not present

## 2016-06-28 DIAGNOSIS — R0600 Dyspnea, unspecified: Secondary | ICD-10-CM | POA: Diagnosis not present

## 2016-06-28 DIAGNOSIS — R042 Hemoptysis: Secondary | ICD-10-CM | POA: Diagnosis not present

## 2016-06-28 DIAGNOSIS — Z8249 Family history of ischemic heart disease and other diseases of the circulatory system: Secondary | ICD-10-CM | POA: Diagnosis not present

## 2016-06-28 DIAGNOSIS — R05 Cough: Secondary | ICD-10-CM | POA: Diagnosis not present

## 2016-06-28 DIAGNOSIS — Z87891 Personal history of nicotine dependence: Secondary | ICD-10-CM | POA: Diagnosis not present

## 2016-06-28 DIAGNOSIS — F419 Anxiety disorder, unspecified: Secondary | ICD-10-CM | POA: Diagnosis not present

## 2016-06-28 DIAGNOSIS — G3184 Mild cognitive impairment, so stated: Secondary | ICD-10-CM | POA: Diagnosis not present

## 2016-06-28 DIAGNOSIS — D72829 Elevated white blood cell count, unspecified: Secondary | ICD-10-CM | POA: Diagnosis not present

## 2016-06-28 DIAGNOSIS — R0602 Shortness of breath: Secondary | ICD-10-CM | POA: Diagnosis not present

## 2016-06-29 DIAGNOSIS — I10 Essential (primary) hypertension: Secondary | ICD-10-CM | POA: Diagnosis not present

## 2016-06-29 DIAGNOSIS — D509 Iron deficiency anemia, unspecified: Secondary | ICD-10-CM | POA: Diagnosis not present

## 2016-06-29 DIAGNOSIS — R918 Other nonspecific abnormal finding of lung field: Secondary | ICD-10-CM | POA: Diagnosis not present

## 2016-06-29 DIAGNOSIS — J441 Chronic obstructive pulmonary disease with (acute) exacerbation: Secondary | ICD-10-CM | POA: Diagnosis not present

## 2016-06-29 DIAGNOSIS — N281 Cyst of kidney, acquired: Secondary | ICD-10-CM | POA: Diagnosis not present

## 2016-06-29 DIAGNOSIS — F0390 Unspecified dementia without behavioral disturbance: Secondary | ICD-10-CM | POA: Diagnosis not present

## 2016-06-29 DIAGNOSIS — F419 Anxiety disorder, unspecified: Secondary | ICD-10-CM | POA: Diagnosis not present

## 2016-06-29 DIAGNOSIS — R05 Cough: Secondary | ICD-10-CM | POA: Diagnosis not present

## 2016-06-29 DIAGNOSIS — K5900 Constipation, unspecified: Secondary | ICD-10-CM | POA: Diagnosis not present

## 2016-06-29 DIAGNOSIS — Q438 Other specified congenital malformations of intestine: Secondary | ICD-10-CM | POA: Diagnosis not present

## 2016-06-29 DIAGNOSIS — E278 Other specified disorders of adrenal gland: Secondary | ICD-10-CM | POA: Diagnosis not present

## 2016-06-29 DIAGNOSIS — R042 Hemoptysis: Secondary | ICD-10-CM | POA: Diagnosis not present

## 2016-06-29 DIAGNOSIS — J439 Emphysema, unspecified: Secondary | ICD-10-CM | POA: Diagnosis not present

## 2016-06-30 DIAGNOSIS — K5900 Constipation, unspecified: Secondary | ICD-10-CM | POA: Diagnosis not present

## 2016-06-30 DIAGNOSIS — D509 Iron deficiency anemia, unspecified: Secondary | ICD-10-CM | POA: Diagnosis not present

## 2016-06-30 DIAGNOSIS — F0390 Unspecified dementia without behavioral disturbance: Secondary | ICD-10-CM | POA: Diagnosis not present

## 2016-06-30 DIAGNOSIS — I10 Essential (primary) hypertension: Secondary | ICD-10-CM | POA: Diagnosis not present

## 2016-06-30 DIAGNOSIS — F419 Anxiety disorder, unspecified: Secondary | ICD-10-CM | POA: Diagnosis not present

## 2016-06-30 DIAGNOSIS — R042 Hemoptysis: Secondary | ICD-10-CM | POA: Diagnosis not present

## 2016-06-30 DIAGNOSIS — J441 Chronic obstructive pulmonary disease with (acute) exacerbation: Secondary | ICD-10-CM | POA: Diagnosis not present

## 2016-07-01 DIAGNOSIS — F0390 Unspecified dementia without behavioral disturbance: Secondary | ICD-10-CM | POA: Diagnosis not present

## 2016-07-01 DIAGNOSIS — I10 Essential (primary) hypertension: Secondary | ICD-10-CM | POA: Diagnosis not present

## 2016-07-01 DIAGNOSIS — F419 Anxiety disorder, unspecified: Secondary | ICD-10-CM | POA: Diagnosis not present

## 2016-07-01 DIAGNOSIS — D509 Iron deficiency anemia, unspecified: Secondary | ICD-10-CM | POA: Diagnosis not present

## 2016-07-01 DIAGNOSIS — J441 Chronic obstructive pulmonary disease with (acute) exacerbation: Secondary | ICD-10-CM | POA: Diagnosis not present

## 2016-07-01 DIAGNOSIS — K5900 Constipation, unspecified: Secondary | ICD-10-CM | POA: Diagnosis not present

## 2016-07-01 DIAGNOSIS — R042 Hemoptysis: Secondary | ICD-10-CM | POA: Diagnosis not present

## 2016-07-02 DIAGNOSIS — Z7409 Other reduced mobility: Secondary | ICD-10-CM | POA: Diagnosis not present

## 2016-07-02 DIAGNOSIS — Z9981 Dependence on supplemental oxygen: Secondary | ICD-10-CM | POA: Diagnosis not present

## 2016-07-02 DIAGNOSIS — J449 Chronic obstructive pulmonary disease, unspecified: Secondary | ICD-10-CM | POA: Diagnosis not present

## 2016-07-02 DIAGNOSIS — K5900 Constipation, unspecified: Secondary | ICD-10-CM | POA: Diagnosis not present

## 2016-07-03 DIAGNOSIS — M6281 Muscle weakness (generalized): Secondary | ICD-10-CM | POA: Diagnosis not present

## 2016-07-03 DIAGNOSIS — R41841 Cognitive communication deficit: Secondary | ICD-10-CM | POA: Diagnosis not present

## 2016-07-03 DIAGNOSIS — Z741 Need for assistance with personal care: Secondary | ICD-10-CM | POA: Diagnosis not present

## 2016-07-03 DIAGNOSIS — R2689 Other abnormalities of gait and mobility: Secondary | ICD-10-CM | POA: Diagnosis not present

## 2016-07-03 DIAGNOSIS — R262 Difficulty in walking, not elsewhere classified: Secondary | ICD-10-CM | POA: Diagnosis not present

## 2016-07-03 DIAGNOSIS — R1084 Generalized abdominal pain: Secondary | ICD-10-CM | POA: Diagnosis not present

## 2016-07-04 DIAGNOSIS — R109 Unspecified abdominal pain: Secondary | ICD-10-CM | POA: Diagnosis not present

## 2016-07-04 DIAGNOSIS — K5900 Constipation, unspecified: Secondary | ICD-10-CM | POA: Diagnosis not present

## 2016-07-04 DIAGNOSIS — R2689 Other abnormalities of gait and mobility: Secondary | ICD-10-CM | POA: Diagnosis not present

## 2016-07-04 DIAGNOSIS — R41841 Cognitive communication deficit: Secondary | ICD-10-CM | POA: Diagnosis not present

## 2016-07-04 DIAGNOSIS — J9610 Chronic respiratory failure, unspecified whether with hypoxia or hypercapnia: Secondary | ICD-10-CM | POA: Diagnosis not present

## 2016-07-04 DIAGNOSIS — J449 Chronic obstructive pulmonary disease, unspecified: Secondary | ICD-10-CM | POA: Diagnosis not present

## 2016-07-04 DIAGNOSIS — R262 Difficulty in walking, not elsewhere classified: Secondary | ICD-10-CM | POA: Diagnosis not present

## 2016-07-04 DIAGNOSIS — R1084 Generalized abdominal pain: Secondary | ICD-10-CM | POA: Diagnosis not present

## 2016-07-04 DIAGNOSIS — Z741 Need for assistance with personal care: Secondary | ICD-10-CM | POA: Diagnosis not present

## 2016-07-04 DIAGNOSIS — M6281 Muscle weakness (generalized): Secondary | ICD-10-CM | POA: Diagnosis not present

## 2016-07-05 DIAGNOSIS — R2689 Other abnormalities of gait and mobility: Secondary | ICD-10-CM | POA: Diagnosis not present

## 2016-07-05 DIAGNOSIS — R41841 Cognitive communication deficit: Secondary | ICD-10-CM | POA: Diagnosis not present

## 2016-07-05 DIAGNOSIS — Z741 Need for assistance with personal care: Secondary | ICD-10-CM | POA: Diagnosis not present

## 2016-07-05 DIAGNOSIS — M6281 Muscle weakness (generalized): Secondary | ICD-10-CM | POA: Diagnosis not present

## 2016-07-05 DIAGNOSIS — R1084 Generalized abdominal pain: Secondary | ICD-10-CM | POA: Diagnosis not present

## 2016-07-05 DIAGNOSIS — R262 Difficulty in walking, not elsewhere classified: Secondary | ICD-10-CM | POA: Diagnosis not present

## 2016-07-05 DIAGNOSIS — F329 Major depressive disorder, single episode, unspecified: Secondary | ICD-10-CM | POA: Diagnosis not present

## 2016-07-05 DIAGNOSIS — F419 Anxiety disorder, unspecified: Secondary | ICD-10-CM | POA: Diagnosis not present

## 2016-07-05 DIAGNOSIS — G4700 Insomnia, unspecified: Secondary | ICD-10-CM | POA: Diagnosis not present

## 2016-07-06 DIAGNOSIS — R1084 Generalized abdominal pain: Secondary | ICD-10-CM | POA: Diagnosis not present

## 2016-07-06 DIAGNOSIS — M6281 Muscle weakness (generalized): Secondary | ICD-10-CM | POA: Diagnosis not present

## 2016-07-06 DIAGNOSIS — R262 Difficulty in walking, not elsewhere classified: Secondary | ICD-10-CM | POA: Diagnosis not present

## 2016-07-06 DIAGNOSIS — R41841 Cognitive communication deficit: Secondary | ICD-10-CM | POA: Diagnosis not present

## 2016-07-06 DIAGNOSIS — R2689 Other abnormalities of gait and mobility: Secondary | ICD-10-CM | POA: Diagnosis not present

## 2016-07-06 DIAGNOSIS — Z741 Need for assistance with personal care: Secondary | ICD-10-CM | POA: Diagnosis not present

## 2016-07-08 DIAGNOSIS — Z8673 Personal history of transient ischemic attack (TIA), and cerebral infarction without residual deficits: Secondary | ICD-10-CM | POA: Diagnosis not present

## 2016-07-08 DIAGNOSIS — I4510 Unspecified right bundle-branch block: Secondary | ICD-10-CM | POA: Diagnosis not present

## 2016-07-08 DIAGNOSIS — R0602 Shortness of breath: Secondary | ICD-10-CM | POA: Diagnosis not present

## 2016-07-08 DIAGNOSIS — R079 Chest pain, unspecified: Secondary | ICD-10-CM | POA: Diagnosis not present

## 2016-07-08 DIAGNOSIS — Z87891 Personal history of nicotine dependence: Secondary | ICD-10-CM | POA: Diagnosis not present

## 2016-07-08 DIAGNOSIS — I77810 Thoracic aortic ectasia: Secondary | ICD-10-CM | POA: Diagnosis not present

## 2016-07-08 DIAGNOSIS — R918 Other nonspecific abnormal finding of lung field: Secondary | ICD-10-CM | POA: Diagnosis not present

## 2016-07-08 DIAGNOSIS — F0390 Unspecified dementia without behavioral disturbance: Secondary | ICD-10-CM | POA: Diagnosis not present

## 2016-07-08 DIAGNOSIS — R140 Abdominal distension (gaseous): Secondary | ICD-10-CM | POA: Diagnosis not present

## 2016-07-08 DIAGNOSIS — J432 Centrilobular emphysema: Secondary | ICD-10-CM | POA: Diagnosis not present

## 2016-07-08 DIAGNOSIS — F419 Anxiety disorder, unspecified: Secondary | ICD-10-CM | POA: Diagnosis not present

## 2016-07-08 DIAGNOSIS — J449 Chronic obstructive pulmonary disease, unspecified: Secondary | ICD-10-CM | POA: Diagnosis not present

## 2016-07-08 DIAGNOSIS — D509 Iron deficiency anemia, unspecified: Secondary | ICD-10-CM | POA: Diagnosis not present

## 2016-07-08 DIAGNOSIS — Z66 Do not resuscitate: Secondary | ICD-10-CM | POA: Diagnosis not present

## 2016-07-08 DIAGNOSIS — K5900 Constipation, unspecified: Secondary | ICD-10-CM | POA: Diagnosis not present

## 2016-07-08 DIAGNOSIS — I10 Essential (primary) hypertension: Secondary | ICD-10-CM | POA: Diagnosis not present

## 2016-07-08 DIAGNOSIS — Z9981 Dependence on supplemental oxygen: Secondary | ICD-10-CM | POA: Diagnosis not present

## 2016-07-08 DIAGNOSIS — D500 Iron deficiency anemia secondary to blood loss (chronic): Secondary | ICD-10-CM | POA: Diagnosis not present

## 2016-07-08 DIAGNOSIS — Z79899 Other long term (current) drug therapy: Secondary | ICD-10-CM | POA: Diagnosis not present

## 2016-07-08 DIAGNOSIS — J441 Chronic obstructive pulmonary disease with (acute) exacerbation: Secondary | ICD-10-CM | POA: Diagnosis not present

## 2016-07-08 DIAGNOSIS — J439 Emphysema, unspecified: Secondary | ICD-10-CM | POA: Diagnosis not present

## 2016-07-08 DIAGNOSIS — R0789 Other chest pain: Secondary | ICD-10-CM | POA: Diagnosis not present

## 2016-07-09 DIAGNOSIS — R531 Weakness: Secondary | ICD-10-CM | POA: Diagnosis not present

## 2016-07-09 DIAGNOSIS — M6250 Muscle wasting and atrophy, not elsewhere classified, unspecified site: Secondary | ICD-10-CM | POA: Diagnosis not present

## 2016-07-09 DIAGNOSIS — J441 Chronic obstructive pulmonary disease with (acute) exacerbation: Secondary | ICD-10-CM | POA: Diagnosis not present

## 2016-07-12 DIAGNOSIS — R262 Difficulty in walking, not elsewhere classified: Secondary | ICD-10-CM | POA: Diagnosis not present

## 2016-07-12 DIAGNOSIS — J189 Pneumonia, unspecified organism: Secondary | ICD-10-CM | POA: Diagnosis not present

## 2016-07-12 DIAGNOSIS — R41841 Cognitive communication deficit: Secondary | ICD-10-CM | POA: Diagnosis not present

## 2016-07-12 DIAGNOSIS — F419 Anxiety disorder, unspecified: Secondary | ICD-10-CM | POA: Diagnosis not present

## 2016-07-12 DIAGNOSIS — R1084 Generalized abdominal pain: Secondary | ICD-10-CM | POA: Diagnosis not present

## 2016-07-12 DIAGNOSIS — M6281 Muscle weakness (generalized): Secondary | ICD-10-CM | POA: Diagnosis not present

## 2016-07-12 DIAGNOSIS — R2689 Other abnormalities of gait and mobility: Secondary | ICD-10-CM | POA: Diagnosis not present

## 2016-07-12 DIAGNOSIS — F329 Major depressive disorder, single episode, unspecified: Secondary | ICD-10-CM | POA: Diagnosis not present

## 2016-07-12 DIAGNOSIS — Z741 Need for assistance with personal care: Secondary | ICD-10-CM | POA: Diagnosis not present

## 2016-07-13 DIAGNOSIS — D649 Anemia, unspecified: Secondary | ICD-10-CM | POA: Diagnosis not present

## 2016-07-13 DIAGNOSIS — G4700 Insomnia, unspecified: Secondary | ICD-10-CM | POA: Diagnosis not present

## 2016-07-13 DIAGNOSIS — R262 Difficulty in walking, not elsewhere classified: Secondary | ICD-10-CM | POA: Diagnosis not present

## 2016-07-13 DIAGNOSIS — Z741 Need for assistance with personal care: Secondary | ICD-10-CM | POA: Diagnosis not present

## 2016-07-13 DIAGNOSIS — G629 Polyneuropathy, unspecified: Secondary | ICD-10-CM | POA: Diagnosis not present

## 2016-07-13 DIAGNOSIS — R1084 Generalized abdominal pain: Secondary | ICD-10-CM | POA: Diagnosis not present

## 2016-07-13 DIAGNOSIS — F419 Anxiety disorder, unspecified: Secondary | ICD-10-CM | POA: Diagnosis not present

## 2016-07-13 DIAGNOSIS — M6281 Muscle weakness (generalized): Secondary | ICD-10-CM | POA: Diagnosis not present

## 2016-07-13 DIAGNOSIS — K219 Gastro-esophageal reflux disease without esophagitis: Secondary | ICD-10-CM | POA: Diagnosis not present

## 2016-07-13 DIAGNOSIS — R2689 Other abnormalities of gait and mobility: Secondary | ICD-10-CM | POA: Diagnosis not present

## 2016-07-13 DIAGNOSIS — K5900 Constipation, unspecified: Secondary | ICD-10-CM | POA: Diagnosis not present

## 2016-07-13 DIAGNOSIS — Z515 Encounter for palliative care: Secondary | ICD-10-CM | POA: Diagnosis not present

## 2016-07-13 DIAGNOSIS — J441 Chronic obstructive pulmonary disease with (acute) exacerbation: Secondary | ICD-10-CM | POA: Diagnosis not present

## 2016-07-13 DIAGNOSIS — R41841 Cognitive communication deficit: Secondary | ICD-10-CM | POA: Diagnosis not present

## 2016-07-14 DIAGNOSIS — K5900 Constipation, unspecified: Secondary | ICD-10-CM | POA: Diagnosis not present

## 2016-07-14 DIAGNOSIS — D649 Anemia, unspecified: Secondary | ICD-10-CM | POA: Diagnosis not present

## 2016-07-14 DIAGNOSIS — J441 Chronic obstructive pulmonary disease with (acute) exacerbation: Secondary | ICD-10-CM | POA: Diagnosis not present

## 2016-07-14 DIAGNOSIS — G629 Polyneuropathy, unspecified: Secondary | ICD-10-CM | POA: Diagnosis not present

## 2016-07-14 DIAGNOSIS — G4700 Insomnia, unspecified: Secondary | ICD-10-CM | POA: Diagnosis not present

## 2016-07-14 DIAGNOSIS — F419 Anxiety disorder, unspecified: Secondary | ICD-10-CM | POA: Diagnosis not present

## 2016-07-17 DIAGNOSIS — K5900 Constipation, unspecified: Secondary | ICD-10-CM | POA: Diagnosis not present

## 2016-07-17 DIAGNOSIS — G4700 Insomnia, unspecified: Secondary | ICD-10-CM | POA: Diagnosis not present

## 2016-07-17 DIAGNOSIS — F419 Anxiety disorder, unspecified: Secondary | ICD-10-CM | POA: Diagnosis not present

## 2016-07-17 DIAGNOSIS — G629 Polyneuropathy, unspecified: Secondary | ICD-10-CM | POA: Diagnosis not present

## 2016-07-17 DIAGNOSIS — J441 Chronic obstructive pulmonary disease with (acute) exacerbation: Secondary | ICD-10-CM | POA: Diagnosis not present

## 2016-07-17 DIAGNOSIS — D649 Anemia, unspecified: Secondary | ICD-10-CM | POA: Diagnosis not present

## 2016-07-18 DIAGNOSIS — K5900 Constipation, unspecified: Secondary | ICD-10-CM | POA: Diagnosis not present

## 2016-07-18 DIAGNOSIS — G4700 Insomnia, unspecified: Secondary | ICD-10-CM | POA: Diagnosis not present

## 2016-07-18 DIAGNOSIS — D649 Anemia, unspecified: Secondary | ICD-10-CM | POA: Diagnosis not present

## 2016-07-18 DIAGNOSIS — J441 Chronic obstructive pulmonary disease with (acute) exacerbation: Secondary | ICD-10-CM | POA: Diagnosis not present

## 2016-07-18 DIAGNOSIS — G629 Polyneuropathy, unspecified: Secondary | ICD-10-CM | POA: Diagnosis not present

## 2016-07-18 DIAGNOSIS — F419 Anxiety disorder, unspecified: Secondary | ICD-10-CM | POA: Diagnosis not present

## 2016-07-19 DIAGNOSIS — G4700 Insomnia, unspecified: Secondary | ICD-10-CM | POA: Diagnosis not present

## 2016-07-19 DIAGNOSIS — J439 Emphysema, unspecified: Secondary | ICD-10-CM | POA: Diagnosis not present

## 2016-07-19 DIAGNOSIS — D649 Anemia, unspecified: Secondary | ICD-10-CM | POA: Diagnosis not present

## 2016-07-19 DIAGNOSIS — J441 Chronic obstructive pulmonary disease with (acute) exacerbation: Secondary | ICD-10-CM | POA: Diagnosis not present

## 2016-07-19 DIAGNOSIS — F419 Anxiety disorder, unspecified: Secondary | ICD-10-CM | POA: Diagnosis not present

## 2016-07-19 DIAGNOSIS — R0789 Other chest pain: Secondary | ICD-10-CM | POA: Diagnosis not present

## 2016-07-19 DIAGNOSIS — R531 Weakness: Secondary | ICD-10-CM | POA: Diagnosis not present

## 2016-07-19 DIAGNOSIS — K5900 Constipation, unspecified: Secondary | ICD-10-CM | POA: Diagnosis not present

## 2016-07-19 DIAGNOSIS — I4510 Unspecified right bundle-branch block: Secondary | ICD-10-CM | POA: Diagnosis not present

## 2016-07-19 DIAGNOSIS — J984 Other disorders of lung: Secondary | ICD-10-CM | POA: Diagnosis not present

## 2016-07-19 DIAGNOSIS — R918 Other nonspecific abnormal finding of lung field: Secondary | ICD-10-CM | POA: Diagnosis not present

## 2016-07-19 DIAGNOSIS — R110 Nausea: Secondary | ICD-10-CM | POA: Diagnosis not present

## 2016-07-19 DIAGNOSIS — M6250 Muscle wasting and atrophy, not elsewhere classified, unspecified site: Secondary | ICD-10-CM | POA: Diagnosis not present

## 2016-07-19 DIAGNOSIS — R52 Pain, unspecified: Secondary | ICD-10-CM | POA: Diagnosis not present

## 2016-07-19 DIAGNOSIS — R079 Chest pain, unspecified: Secondary | ICD-10-CM | POA: Diagnosis not present

## 2016-07-19 DIAGNOSIS — G629 Polyneuropathy, unspecified: Secondary | ICD-10-CM | POA: Diagnosis not present

## 2016-07-20 DIAGNOSIS — G629 Polyneuropathy, unspecified: Secondary | ICD-10-CM | POA: Diagnosis not present

## 2016-07-20 DIAGNOSIS — F419 Anxiety disorder, unspecified: Secondary | ICD-10-CM | POA: Diagnosis not present

## 2016-07-20 DIAGNOSIS — K5900 Constipation, unspecified: Secondary | ICD-10-CM | POA: Diagnosis not present

## 2016-07-20 DIAGNOSIS — D649 Anemia, unspecified: Secondary | ICD-10-CM | POA: Diagnosis not present

## 2016-07-20 DIAGNOSIS — G4700 Insomnia, unspecified: Secondary | ICD-10-CM | POA: Diagnosis not present

## 2016-07-20 DIAGNOSIS — J441 Chronic obstructive pulmonary disease with (acute) exacerbation: Secondary | ICD-10-CM | POA: Diagnosis not present

## 2016-07-21 DIAGNOSIS — G8911 Acute pain due to trauma: Secondary | ICD-10-CM | POA: Diagnosis not present

## 2016-07-21 DIAGNOSIS — R531 Weakness: Secondary | ICD-10-CM | POA: Diagnosis not present

## 2016-07-21 DIAGNOSIS — K5900 Constipation, unspecified: Secondary | ICD-10-CM | POA: Diagnosis not present

## 2016-07-21 DIAGNOSIS — R0789 Other chest pain: Secondary | ICD-10-CM | POA: Diagnosis not present

## 2016-07-21 DIAGNOSIS — R079 Chest pain, unspecified: Secondary | ICD-10-CM | POA: Diagnosis not present

## 2016-07-21 DIAGNOSIS — M6250 Muscle wasting and atrophy, not elsewhere classified, unspecified site: Secondary | ICD-10-CM | POA: Diagnosis not present

## 2016-07-21 DIAGNOSIS — R140 Abdominal distension (gaseous): Secondary | ICD-10-CM | POA: Diagnosis not present

## 2016-07-21 DIAGNOSIS — I4510 Unspecified right bundle-branch block: Secondary | ICD-10-CM | POA: Diagnosis not present

## 2016-07-21 DIAGNOSIS — R52 Pain, unspecified: Secondary | ICD-10-CM | POA: Diagnosis not present

## 2016-07-21 DIAGNOSIS — R0602 Shortness of breath: Secondary | ICD-10-CM | POA: Diagnosis not present

## 2016-07-23 DIAGNOSIS — R079 Chest pain, unspecified: Secondary | ICD-10-CM | POA: Diagnosis not present

## 2016-07-23 DIAGNOSIS — F411 Generalized anxiety disorder: Secondary | ICD-10-CM | POA: Diagnosis not present

## 2016-07-23 DIAGNOSIS — K5909 Other constipation: Secondary | ICD-10-CM | POA: Diagnosis not present

## 2016-07-24 DIAGNOSIS — F419 Anxiety disorder, unspecified: Secondary | ICD-10-CM | POA: Diagnosis not present

## 2016-07-24 DIAGNOSIS — R0789 Other chest pain: Secondary | ICD-10-CM | POA: Diagnosis not present

## 2016-07-24 DIAGNOSIS — K5900 Constipation, unspecified: Secondary | ICD-10-CM | POA: Diagnosis not present

## 2016-07-24 DIAGNOSIS — J441 Chronic obstructive pulmonary disease with (acute) exacerbation: Secondary | ICD-10-CM | POA: Diagnosis not present

## 2016-07-24 DIAGNOSIS — Z9981 Dependence on supplemental oxygen: Secondary | ICD-10-CM | POA: Diagnosis not present

## 2016-07-24 DIAGNOSIS — G4700 Insomnia, unspecified: Secondary | ICD-10-CM | POA: Diagnosis not present

## 2016-07-24 DIAGNOSIS — G629 Polyneuropathy, unspecified: Secondary | ICD-10-CM | POA: Diagnosis not present

## 2016-07-24 DIAGNOSIS — R079 Chest pain, unspecified: Secondary | ICD-10-CM | POA: Diagnosis not present

## 2016-07-24 DIAGNOSIS — D649 Anemia, unspecified: Secondary | ICD-10-CM | POA: Diagnosis not present

## 2016-07-24 DIAGNOSIS — R0602 Shortness of breath: Secondary | ICD-10-CM | POA: Diagnosis not present

## 2016-07-25 DIAGNOSIS — K5900 Constipation, unspecified: Secondary | ICD-10-CM | POA: Diagnosis not present

## 2016-07-25 DIAGNOSIS — G4700 Insomnia, unspecified: Secondary | ICD-10-CM | POA: Diagnosis not present

## 2016-07-25 DIAGNOSIS — D649 Anemia, unspecified: Secondary | ICD-10-CM | POA: Diagnosis not present

## 2016-07-25 DIAGNOSIS — G629 Polyneuropathy, unspecified: Secondary | ICD-10-CM | POA: Diagnosis not present

## 2016-07-25 DIAGNOSIS — F419 Anxiety disorder, unspecified: Secondary | ICD-10-CM | POA: Diagnosis not present

## 2016-07-25 DIAGNOSIS — J441 Chronic obstructive pulmonary disease with (acute) exacerbation: Secondary | ICD-10-CM | POA: Diagnosis not present

## 2016-07-26 DIAGNOSIS — M19011 Primary osteoarthritis, right shoulder: Secondary | ICD-10-CM | POA: Diagnosis not present

## 2016-07-26 DIAGNOSIS — R52 Pain, unspecified: Secondary | ICD-10-CM | POA: Diagnosis not present

## 2016-07-26 DIAGNOSIS — M25511 Pain in right shoulder: Secondary | ICD-10-CM | POA: Diagnosis not present

## 2016-07-26 DIAGNOSIS — R079 Chest pain, unspecified: Secondary | ICD-10-CM | POA: Diagnosis not present

## 2016-07-26 DIAGNOSIS — W1830XA Fall on same level, unspecified, initial encounter: Secondary | ICD-10-CM | POA: Diagnosis not present

## 2016-07-26 DIAGNOSIS — R0789 Other chest pain: Secondary | ICD-10-CM | POA: Diagnosis not present

## 2016-07-27 DIAGNOSIS — R531 Weakness: Secondary | ICD-10-CM | POA: Diagnosis not present

## 2016-07-27 DIAGNOSIS — J441 Chronic obstructive pulmonary disease with (acute) exacerbation: Secondary | ICD-10-CM | POA: Diagnosis not present

## 2016-07-27 DIAGNOSIS — K5900 Constipation, unspecified: Secondary | ICD-10-CM | POA: Diagnosis not present

## 2016-07-27 DIAGNOSIS — G629 Polyneuropathy, unspecified: Secondary | ICD-10-CM | POA: Diagnosis not present

## 2016-07-27 DIAGNOSIS — D649 Anemia, unspecified: Secondary | ICD-10-CM | POA: Diagnosis not present

## 2016-07-27 DIAGNOSIS — G4700 Insomnia, unspecified: Secondary | ICD-10-CM | POA: Diagnosis not present

## 2016-07-27 DIAGNOSIS — Z9981 Dependence on supplemental oxygen: Secondary | ICD-10-CM | POA: Diagnosis not present

## 2016-07-27 DIAGNOSIS — R079 Chest pain, unspecified: Secondary | ICD-10-CM | POA: Diagnosis not present

## 2016-07-27 DIAGNOSIS — F419 Anxiety disorder, unspecified: Secondary | ICD-10-CM | POA: Diagnosis not present

## 2016-07-31 DIAGNOSIS — F419 Anxiety disorder, unspecified: Secondary | ICD-10-CM | POA: Diagnosis not present

## 2016-07-31 DIAGNOSIS — K5900 Constipation, unspecified: Secondary | ICD-10-CM | POA: Diagnosis not present

## 2016-07-31 DIAGNOSIS — J441 Chronic obstructive pulmonary disease with (acute) exacerbation: Secondary | ICD-10-CM | POA: Diagnosis not present

## 2016-07-31 DIAGNOSIS — G629 Polyneuropathy, unspecified: Secondary | ICD-10-CM | POA: Diagnosis not present

## 2016-07-31 DIAGNOSIS — G4700 Insomnia, unspecified: Secondary | ICD-10-CM | POA: Diagnosis not present

## 2016-07-31 DIAGNOSIS — D649 Anemia, unspecified: Secondary | ICD-10-CM | POA: Diagnosis not present

## 2016-08-01 DIAGNOSIS — F419 Anxiety disorder, unspecified: Secondary | ICD-10-CM | POA: Diagnosis not present

## 2016-08-01 DIAGNOSIS — G4700 Insomnia, unspecified: Secondary | ICD-10-CM | POA: Diagnosis not present

## 2016-08-01 DIAGNOSIS — K5900 Constipation, unspecified: Secondary | ICD-10-CM | POA: Diagnosis not present

## 2016-08-01 DIAGNOSIS — D649 Anemia, unspecified: Secondary | ICD-10-CM | POA: Diagnosis not present

## 2016-08-01 DIAGNOSIS — G629 Polyneuropathy, unspecified: Secondary | ICD-10-CM | POA: Diagnosis not present

## 2016-08-01 DIAGNOSIS — J441 Chronic obstructive pulmonary disease with (acute) exacerbation: Secondary | ICD-10-CM | POA: Diagnosis not present

## 2016-08-02 DIAGNOSIS — G4700 Insomnia, unspecified: Secondary | ICD-10-CM | POA: Diagnosis not present

## 2016-08-02 DIAGNOSIS — F329 Major depressive disorder, single episode, unspecified: Secondary | ICD-10-CM | POA: Diagnosis not present

## 2016-08-02 DIAGNOSIS — F419 Anxiety disorder, unspecified: Secondary | ICD-10-CM | POA: Diagnosis not present

## 2016-08-03 DIAGNOSIS — R0602 Shortness of breath: Secondary | ICD-10-CM | POA: Diagnosis not present

## 2016-08-03 DIAGNOSIS — G629 Polyneuropathy, unspecified: Secondary | ICD-10-CM | POA: Diagnosis not present

## 2016-08-03 DIAGNOSIS — G4700 Insomnia, unspecified: Secondary | ICD-10-CM | POA: Diagnosis not present

## 2016-08-03 DIAGNOSIS — F419 Anxiety disorder, unspecified: Secondary | ICD-10-CM | POA: Diagnosis not present

## 2016-08-03 DIAGNOSIS — K5900 Constipation, unspecified: Secondary | ICD-10-CM | POA: Diagnosis not present

## 2016-08-03 DIAGNOSIS — D649 Anemia, unspecified: Secondary | ICD-10-CM | POA: Diagnosis not present

## 2016-08-03 DIAGNOSIS — J441 Chronic obstructive pulmonary disease with (acute) exacerbation: Secondary | ICD-10-CM | POA: Diagnosis not present

## 2016-08-07 DIAGNOSIS — D649 Anemia, unspecified: Secondary | ICD-10-CM | POA: Diagnosis not present

## 2016-08-07 DIAGNOSIS — G629 Polyneuropathy, unspecified: Secondary | ICD-10-CM | POA: Diagnosis not present

## 2016-08-07 DIAGNOSIS — K219 Gastro-esophageal reflux disease without esophagitis: Secondary | ICD-10-CM | POA: Diagnosis not present

## 2016-08-07 DIAGNOSIS — J441 Chronic obstructive pulmonary disease with (acute) exacerbation: Secondary | ICD-10-CM | POA: Diagnosis not present

## 2016-08-07 DIAGNOSIS — F419 Anxiety disorder, unspecified: Secondary | ICD-10-CM | POA: Diagnosis not present

## 2016-08-07 DIAGNOSIS — K5900 Constipation, unspecified: Secondary | ICD-10-CM | POA: Diagnosis not present

## 2016-08-07 DIAGNOSIS — G4700 Insomnia, unspecified: Secondary | ICD-10-CM | POA: Diagnosis not present

## 2016-08-07 DIAGNOSIS — Z515 Encounter for palliative care: Secondary | ICD-10-CM | POA: Diagnosis not present

## 2016-08-09 DIAGNOSIS — K5900 Constipation, unspecified: Secondary | ICD-10-CM | POA: Diagnosis not present

## 2016-08-09 DIAGNOSIS — G3184 Mild cognitive impairment, so stated: Secondary | ICD-10-CM | POA: Diagnosis not present

## 2016-08-09 DIAGNOSIS — G4700 Insomnia, unspecified: Secondary | ICD-10-CM | POA: Diagnosis not present

## 2016-08-09 DIAGNOSIS — J441 Chronic obstructive pulmonary disease with (acute) exacerbation: Secondary | ICD-10-CM | POA: Diagnosis not present

## 2016-08-09 DIAGNOSIS — G629 Polyneuropathy, unspecified: Secondary | ICD-10-CM | POA: Diagnosis not present

## 2016-08-09 DIAGNOSIS — D649 Anemia, unspecified: Secondary | ICD-10-CM | POA: Diagnosis not present

## 2016-08-09 DIAGNOSIS — F329 Major depressive disorder, single episode, unspecified: Secondary | ICD-10-CM | POA: Diagnosis not present

## 2016-08-09 DIAGNOSIS — F419 Anxiety disorder, unspecified: Secondary | ICD-10-CM | POA: Diagnosis not present

## 2016-08-10 DIAGNOSIS — F419 Anxiety disorder, unspecified: Secondary | ICD-10-CM | POA: Diagnosis not present

## 2016-08-10 DIAGNOSIS — D649 Anemia, unspecified: Secondary | ICD-10-CM | POA: Diagnosis not present

## 2016-08-10 DIAGNOSIS — G629 Polyneuropathy, unspecified: Secondary | ICD-10-CM | POA: Diagnosis not present

## 2016-08-10 DIAGNOSIS — K5900 Constipation, unspecified: Secondary | ICD-10-CM | POA: Diagnosis not present

## 2016-08-10 DIAGNOSIS — G4700 Insomnia, unspecified: Secondary | ICD-10-CM | POA: Diagnosis not present

## 2016-08-10 DIAGNOSIS — J441 Chronic obstructive pulmonary disease with (acute) exacerbation: Secondary | ICD-10-CM | POA: Diagnosis not present

## 2016-08-14 DIAGNOSIS — G629 Polyneuropathy, unspecified: Secondary | ICD-10-CM | POA: Diagnosis not present

## 2016-08-14 DIAGNOSIS — K5900 Constipation, unspecified: Secondary | ICD-10-CM | POA: Diagnosis not present

## 2016-08-14 DIAGNOSIS — J441 Chronic obstructive pulmonary disease with (acute) exacerbation: Secondary | ICD-10-CM | POA: Diagnosis not present

## 2016-08-14 DIAGNOSIS — F419 Anxiety disorder, unspecified: Secondary | ICD-10-CM | POA: Diagnosis not present

## 2016-08-14 DIAGNOSIS — D649 Anemia, unspecified: Secondary | ICD-10-CM | POA: Diagnosis not present

## 2016-08-14 DIAGNOSIS — G4700 Insomnia, unspecified: Secondary | ICD-10-CM | POA: Diagnosis not present

## 2016-08-14 IMAGING — CT CT ABD-PELV W/ CM
2 of 5 series · 16 of 46 positions shown, 18 images · IV contrast (iopamidol)
Comparison: 01/25/2015

CLINICAL DATA: Mid abdominal pain for 1 year.  Abdominal mass.

EXAM:
CT ABDOMEN AND PELVIS WITH CONTRAST
TECHNIQUE: Multidetector CT imaging of the abdomen and pelvis was performed
using the standard protocol following bolus administration of
intravenous contrast.
CONTRAST:  100mL G4NYEW-HWW IOPAMIDOL (G4NYEW-HWW) INJECTION 61%

[Series 2: abd/ pelvis 5.0 i30f 1 · axial · 0.74mm/px · z∈[+844,+1244]mm · 13 of 92 slices shown, 15 images]
[im 6/92  soft-tissue]
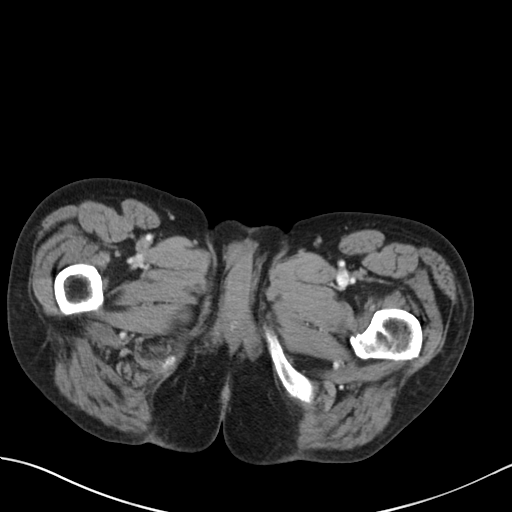
[im 6/92  bone]
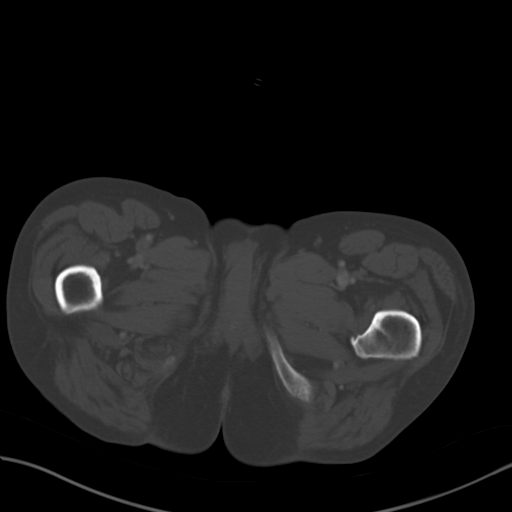
[im 11/92  soft-tissue]
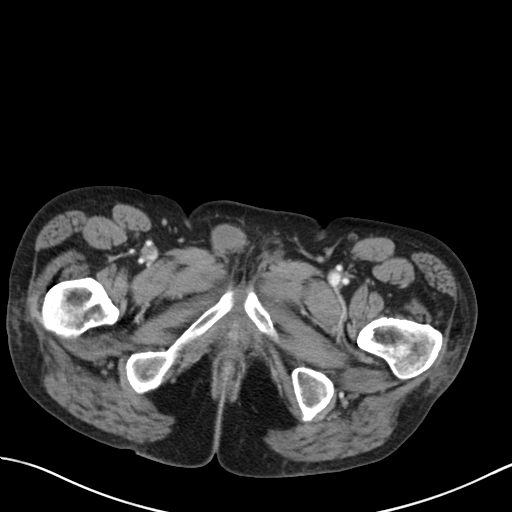
[im 21/92  soft-tissue]
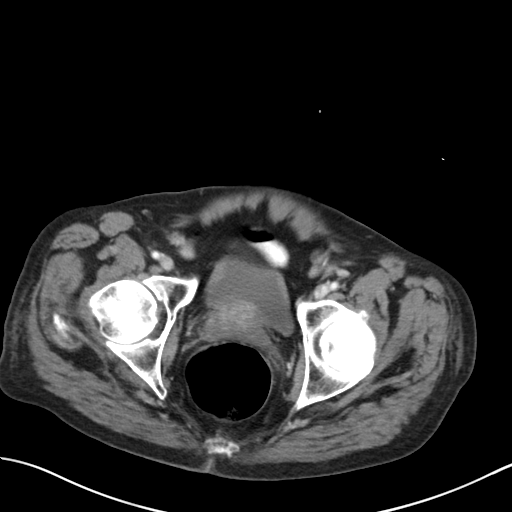
[im 26/92  soft-tissue]
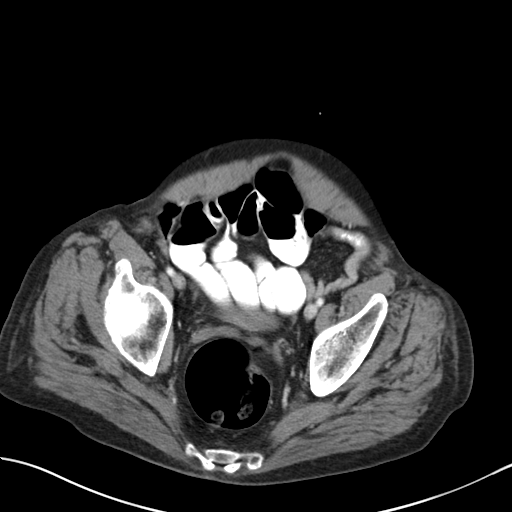
[im 31/92  soft-tissue]
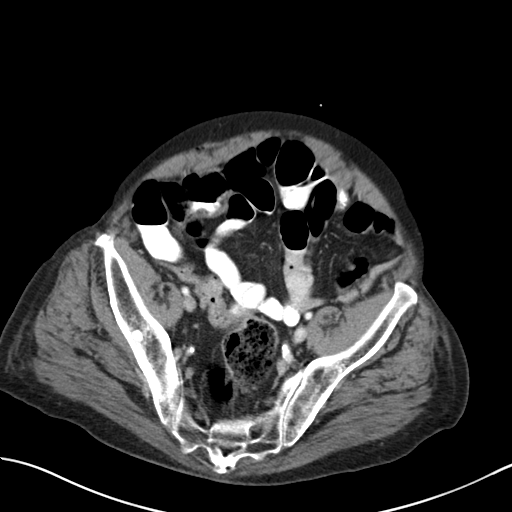
[im 41/92  soft-tissue]
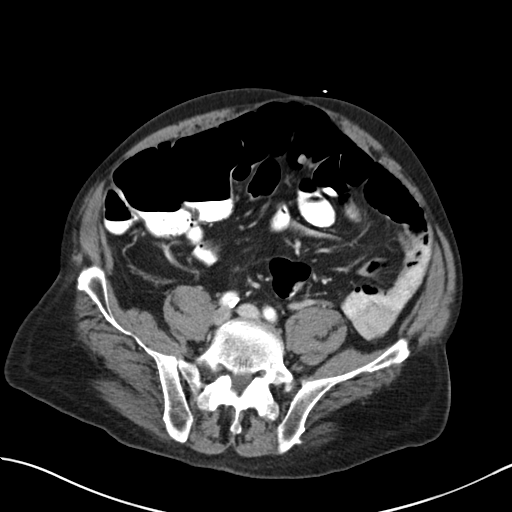
[im 46/92  soft-tissue]
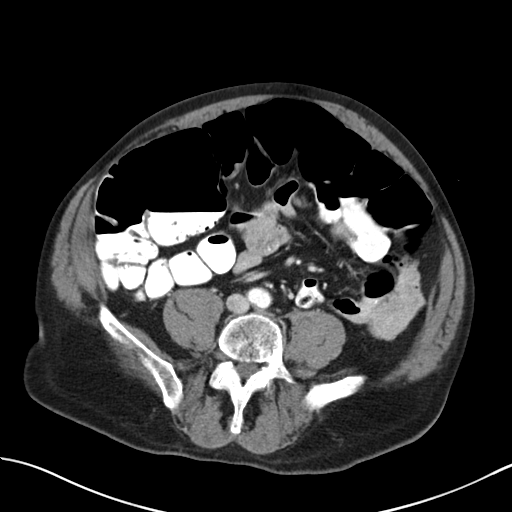
[im 51/92  soft-tissue]
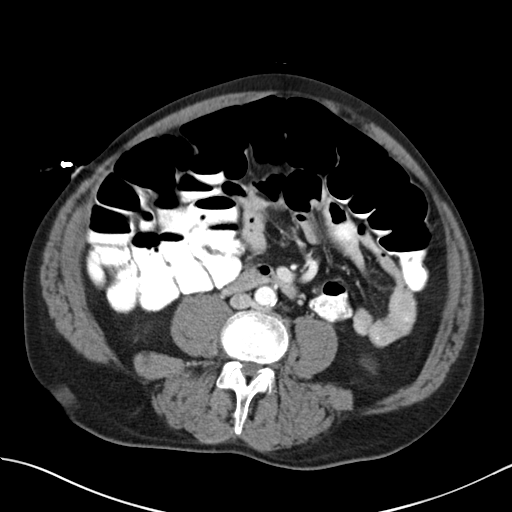
[im 61/92  soft-tissue]
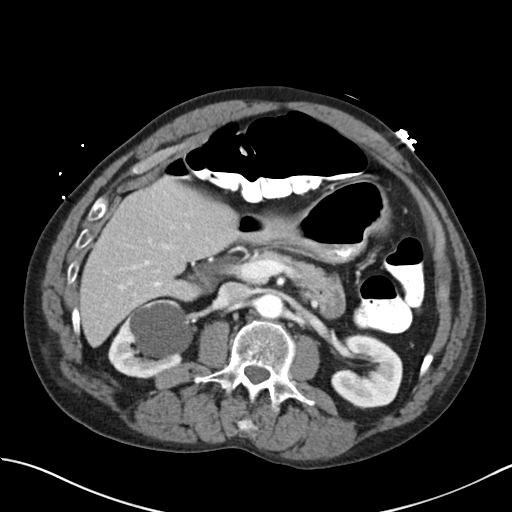
[im 61/92  bone]
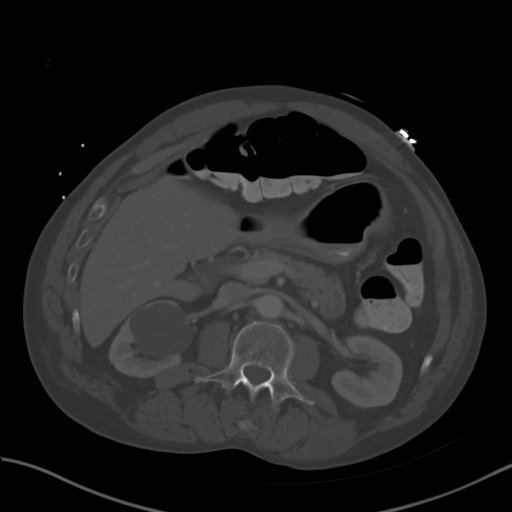
[im 66/92  soft-tissue]
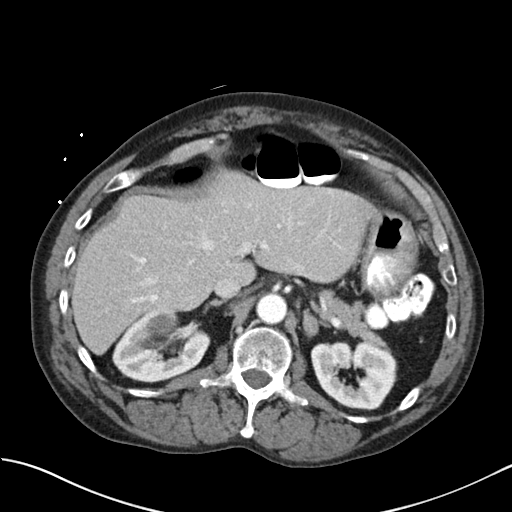
[im 71/92  soft-tissue]
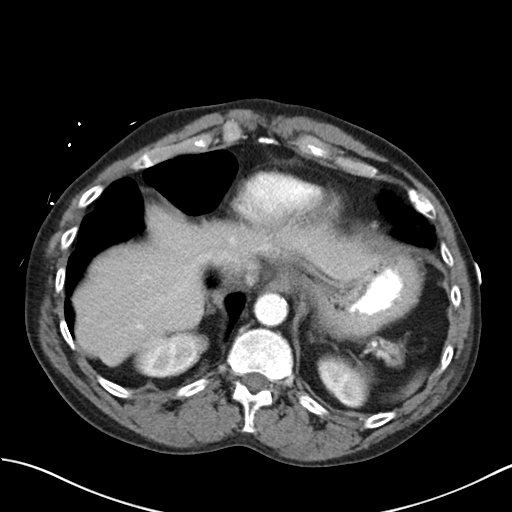
[im 81/92  soft-tissue]
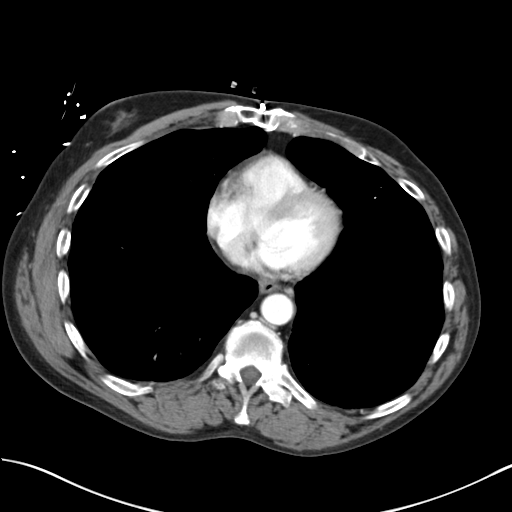
[im 86/92  soft-tissue]
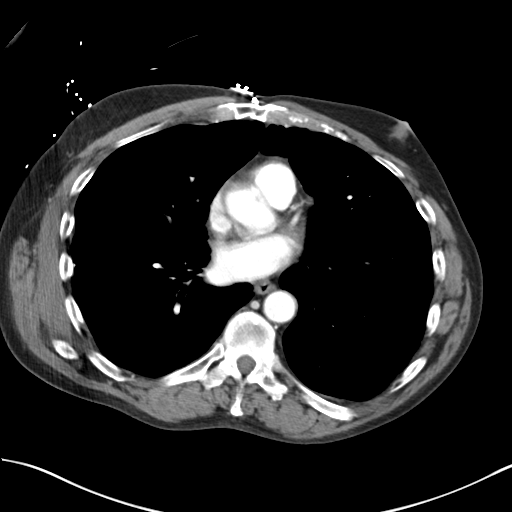

[Series 5: coronal soft tissue · coronal · 0.70mm/px · 3 of 88 slices shown]
[im 30/88  soft-tissue]
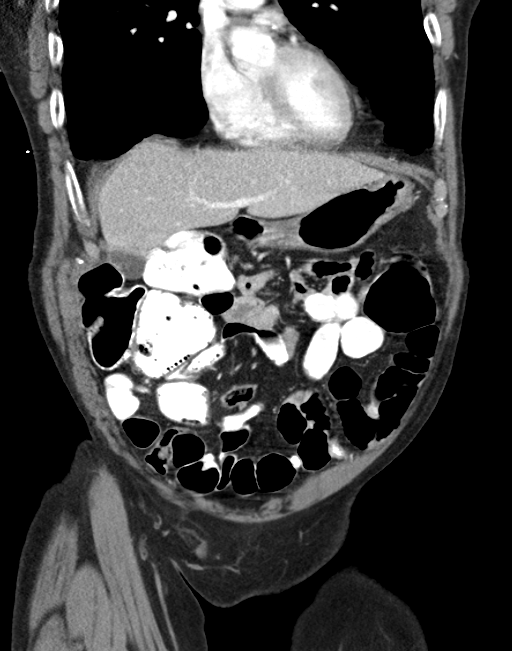
[im 39/88  soft-tissue]
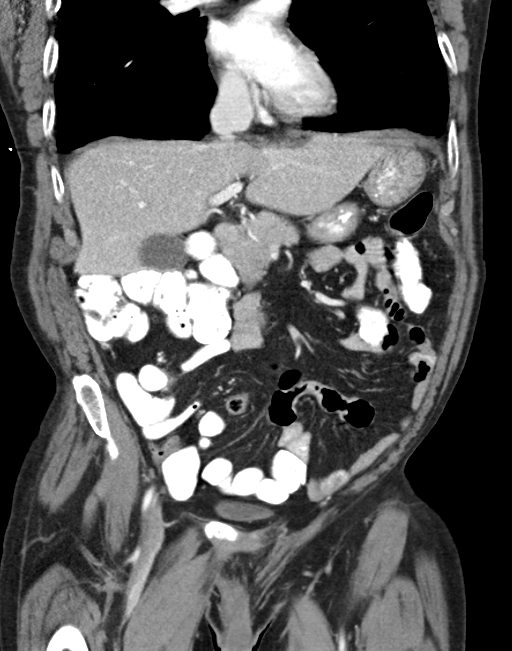
[im 49/88  soft-tissue]
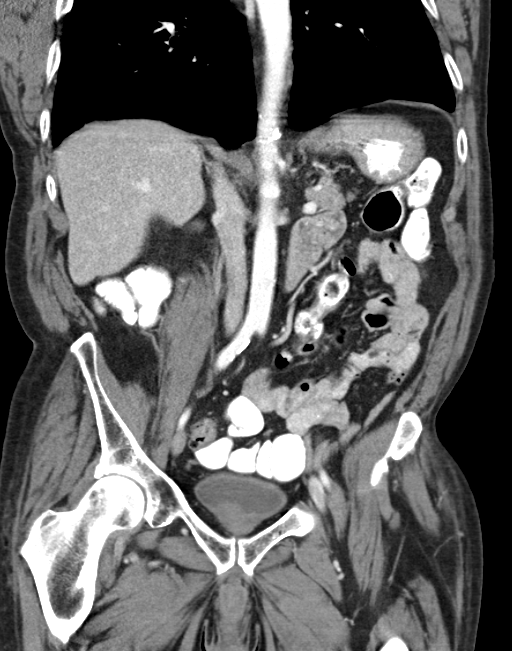

[16 of 46 positions shown; findings below may reference images not displayed]

FINDINGS: There is advanced emphysema at the lung bases with associated
posterior basilar scarring.

Mild diffuse hepatic steatosis

Gallbladder, spleen, and pancreas are within normal limits.

Small left adrenal nodule is stable on image 27. Right adrenal gland
is normal.

4.7 cm complex cystic lesion in the right kidney is stable.

Terminal ileum and appendix are unremarkable. Large bowel is
distended with air-fluid levels. Descending and sigmoid colon are
relatively decompressed, but there is no focal mass or stricture to
suggest a transition point cause.

Prostate is mildly enlarged.

There is fluid in the distal right inguinal ring of unknown
significance.

No free-fluid.  No abnormal retroperitoneal adenopathy.

There is gas within the subcutaneous fat of the right lower quadrant
likely from a recent injection.
IMPRESSION: No acute intra-abdominal or intrapelvic pathology.

Chronic changes are noted.

## 2016-08-17 DIAGNOSIS — G629 Polyneuropathy, unspecified: Secondary | ICD-10-CM | POA: Diagnosis not present

## 2016-08-17 DIAGNOSIS — D649 Anemia, unspecified: Secondary | ICD-10-CM | POA: Diagnosis not present

## 2016-08-17 DIAGNOSIS — J441 Chronic obstructive pulmonary disease with (acute) exacerbation: Secondary | ICD-10-CM | POA: Diagnosis not present

## 2016-08-17 DIAGNOSIS — F419 Anxiety disorder, unspecified: Secondary | ICD-10-CM | POA: Diagnosis not present

## 2016-08-17 DIAGNOSIS — G4700 Insomnia, unspecified: Secondary | ICD-10-CM | POA: Diagnosis not present

## 2016-08-17 DIAGNOSIS — K5900 Constipation, unspecified: Secondary | ICD-10-CM | POA: Diagnosis not present

## 2016-08-21 DIAGNOSIS — K5900 Constipation, unspecified: Secondary | ICD-10-CM | POA: Diagnosis not present

## 2016-08-21 DIAGNOSIS — G4700 Insomnia, unspecified: Secondary | ICD-10-CM | POA: Diagnosis not present

## 2016-08-21 DIAGNOSIS — F419 Anxiety disorder, unspecified: Secondary | ICD-10-CM | POA: Diagnosis not present

## 2016-08-21 DIAGNOSIS — D649 Anemia, unspecified: Secondary | ICD-10-CM | POA: Diagnosis not present

## 2016-08-21 DIAGNOSIS — G629 Polyneuropathy, unspecified: Secondary | ICD-10-CM | POA: Diagnosis not present

## 2016-08-21 DIAGNOSIS — J441 Chronic obstructive pulmonary disease with (acute) exacerbation: Secondary | ICD-10-CM | POA: Diagnosis not present

## 2016-08-24 DIAGNOSIS — D649 Anemia, unspecified: Secondary | ICD-10-CM | POA: Diagnosis not present

## 2016-08-24 DIAGNOSIS — K5900 Constipation, unspecified: Secondary | ICD-10-CM | POA: Diagnosis not present

## 2016-08-24 DIAGNOSIS — J441 Chronic obstructive pulmonary disease with (acute) exacerbation: Secondary | ICD-10-CM | POA: Diagnosis not present

## 2016-08-24 DIAGNOSIS — F419 Anxiety disorder, unspecified: Secondary | ICD-10-CM | POA: Diagnosis not present

## 2016-08-24 DIAGNOSIS — G629 Polyneuropathy, unspecified: Secondary | ICD-10-CM | POA: Diagnosis not present

## 2016-08-24 DIAGNOSIS — G4700 Insomnia, unspecified: Secondary | ICD-10-CM | POA: Diagnosis not present

## 2016-08-27 DIAGNOSIS — Q438 Other specified congenital malformations of intestine: Secondary | ICD-10-CM | POA: Diagnosis not present

## 2016-08-27 DIAGNOSIS — E278 Other specified disorders of adrenal gland: Secondary | ICD-10-CM | POA: Diagnosis not present

## 2016-08-27 DIAGNOSIS — R1084 Generalized abdominal pain: Secondary | ICD-10-CM | POA: Diagnosis not present

## 2016-08-27 DIAGNOSIS — F419 Anxiety disorder, unspecified: Secondary | ICD-10-CM | POA: Diagnosis not present

## 2016-08-27 DIAGNOSIS — N281 Cyst of kidney, acquired: Secondary | ICD-10-CM | POA: Diagnosis not present

## 2016-08-27 DIAGNOSIS — R531 Weakness: Secondary | ICD-10-CM | POA: Diagnosis not present

## 2016-08-29 DIAGNOSIS — G629 Polyneuropathy, unspecified: Secondary | ICD-10-CM | POA: Diagnosis not present

## 2016-08-29 DIAGNOSIS — G4700 Insomnia, unspecified: Secondary | ICD-10-CM | POA: Diagnosis not present

## 2016-08-29 DIAGNOSIS — J441 Chronic obstructive pulmonary disease with (acute) exacerbation: Secondary | ICD-10-CM | POA: Diagnosis not present

## 2016-08-29 DIAGNOSIS — D649 Anemia, unspecified: Secondary | ICD-10-CM | POA: Diagnosis not present

## 2016-08-29 DIAGNOSIS — K5900 Constipation, unspecified: Secondary | ICD-10-CM | POA: Diagnosis not present

## 2016-08-29 DIAGNOSIS — F419 Anxiety disorder, unspecified: Secondary | ICD-10-CM | POA: Diagnosis not present

## 2016-08-30 DIAGNOSIS — K5900 Constipation, unspecified: Secondary | ICD-10-CM | POA: Diagnosis not present

## 2016-08-30 DIAGNOSIS — G629 Polyneuropathy, unspecified: Secondary | ICD-10-CM | POA: Diagnosis not present

## 2016-08-30 DIAGNOSIS — F329 Major depressive disorder, single episode, unspecified: Secondary | ICD-10-CM | POA: Diagnosis not present

## 2016-08-30 DIAGNOSIS — F411 Generalized anxiety disorder: Secondary | ICD-10-CM | POA: Diagnosis not present

## 2016-08-30 DIAGNOSIS — G4700 Insomnia, unspecified: Secondary | ICD-10-CM | POA: Diagnosis not present

## 2016-08-30 DIAGNOSIS — J441 Chronic obstructive pulmonary disease with (acute) exacerbation: Secondary | ICD-10-CM | POA: Diagnosis not present

## 2016-08-30 DIAGNOSIS — D649 Anemia, unspecified: Secondary | ICD-10-CM | POA: Diagnosis not present

## 2016-08-30 DIAGNOSIS — F419 Anxiety disorder, unspecified: Secondary | ICD-10-CM | POA: Diagnosis not present

## 2016-08-31 DIAGNOSIS — F419 Anxiety disorder, unspecified: Secondary | ICD-10-CM | POA: Diagnosis not present

## 2016-08-31 DIAGNOSIS — K5909 Other constipation: Secondary | ICD-10-CM | POA: Diagnosis not present

## 2016-08-31 DIAGNOSIS — J449 Chronic obstructive pulmonary disease, unspecified: Secondary | ICD-10-CM | POA: Diagnosis not present

## 2016-08-31 DIAGNOSIS — F418 Other specified anxiety disorders: Secondary | ICD-10-CM | POA: Diagnosis not present

## 2016-08-31 DIAGNOSIS — F0390 Unspecified dementia without behavioral disturbance: Secondary | ICD-10-CM | POA: Diagnosis not present

## 2016-08-31 DIAGNOSIS — D649 Anemia, unspecified: Secondary | ICD-10-CM | POA: Diagnosis not present

## 2016-08-31 DIAGNOSIS — G629 Polyneuropathy, unspecified: Secondary | ICD-10-CM | POA: Diagnosis not present

## 2016-08-31 DIAGNOSIS — K5900 Constipation, unspecified: Secondary | ICD-10-CM | POA: Diagnosis not present

## 2016-08-31 DIAGNOSIS — J441 Chronic obstructive pulmonary disease with (acute) exacerbation: Secondary | ICD-10-CM | POA: Diagnosis not present

## 2016-08-31 DIAGNOSIS — G4700 Insomnia, unspecified: Secondary | ICD-10-CM | POA: Diagnosis not present

## 2016-09-01 DIAGNOSIS — G4700 Insomnia, unspecified: Secondary | ICD-10-CM | POA: Diagnosis not present

## 2016-09-01 DIAGNOSIS — J441 Chronic obstructive pulmonary disease with (acute) exacerbation: Secondary | ICD-10-CM | POA: Diagnosis not present

## 2016-09-01 DIAGNOSIS — G629 Polyneuropathy, unspecified: Secondary | ICD-10-CM | POA: Diagnosis not present

## 2016-09-01 DIAGNOSIS — F419 Anxiety disorder, unspecified: Secondary | ICD-10-CM | POA: Diagnosis not present

## 2016-09-01 DIAGNOSIS — D649 Anemia, unspecified: Secondary | ICD-10-CM | POA: Diagnosis not present

## 2016-09-01 DIAGNOSIS — K5900 Constipation, unspecified: Secondary | ICD-10-CM | POA: Diagnosis not present

## 2016-09-06 DIAGNOSIS — G4700 Insomnia, unspecified: Secondary | ICD-10-CM | POA: Diagnosis not present

## 2016-09-06 DIAGNOSIS — J441 Chronic obstructive pulmonary disease with (acute) exacerbation: Secondary | ICD-10-CM | POA: Diagnosis not present

## 2016-09-06 DIAGNOSIS — F329 Major depressive disorder, single episode, unspecified: Secondary | ICD-10-CM | POA: Diagnosis not present

## 2016-09-06 DIAGNOSIS — G629 Polyneuropathy, unspecified: Secondary | ICD-10-CM | POA: Diagnosis not present

## 2016-09-06 DIAGNOSIS — D649 Anemia, unspecified: Secondary | ICD-10-CM | POA: Diagnosis not present

## 2016-09-06 DIAGNOSIS — F419 Anxiety disorder, unspecified: Secondary | ICD-10-CM | POA: Diagnosis not present

## 2016-09-06 DIAGNOSIS — K5900 Constipation, unspecified: Secondary | ICD-10-CM | POA: Diagnosis not present

## 2016-09-07 DIAGNOSIS — K219 Gastro-esophageal reflux disease without esophagitis: Secondary | ICD-10-CM | POA: Diagnosis not present

## 2016-09-07 DIAGNOSIS — G4700 Insomnia, unspecified: Secondary | ICD-10-CM | POA: Diagnosis not present

## 2016-09-07 DIAGNOSIS — G629 Polyneuropathy, unspecified: Secondary | ICD-10-CM | POA: Diagnosis not present

## 2016-09-07 DIAGNOSIS — D649 Anemia, unspecified: Secondary | ICD-10-CM | POA: Diagnosis not present

## 2016-09-07 DIAGNOSIS — J441 Chronic obstructive pulmonary disease with (acute) exacerbation: Secondary | ICD-10-CM | POA: Diagnosis not present

## 2016-09-07 DIAGNOSIS — F419 Anxiety disorder, unspecified: Secondary | ICD-10-CM | POA: Diagnosis not present

## 2016-09-07 DIAGNOSIS — K5900 Constipation, unspecified: Secondary | ICD-10-CM | POA: Diagnosis not present

## 2016-09-07 DIAGNOSIS — Z515 Encounter for palliative care: Secondary | ICD-10-CM | POA: Diagnosis not present

## 2016-09-08 DIAGNOSIS — D649 Anemia, unspecified: Secondary | ICD-10-CM | POA: Diagnosis not present

## 2016-09-08 DIAGNOSIS — G4700 Insomnia, unspecified: Secondary | ICD-10-CM | POA: Diagnosis not present

## 2016-09-08 DIAGNOSIS — K5900 Constipation, unspecified: Secondary | ICD-10-CM | POA: Diagnosis not present

## 2016-09-08 DIAGNOSIS — G629 Polyneuropathy, unspecified: Secondary | ICD-10-CM | POA: Diagnosis not present

## 2016-09-08 DIAGNOSIS — J441 Chronic obstructive pulmonary disease with (acute) exacerbation: Secondary | ICD-10-CM | POA: Diagnosis not present

## 2016-09-08 DIAGNOSIS — F419 Anxiety disorder, unspecified: Secondary | ICD-10-CM | POA: Diagnosis not present

## 2016-09-10 DIAGNOSIS — J449 Chronic obstructive pulmonary disease, unspecified: Secondary | ICD-10-CM | POA: Diagnosis not present

## 2016-09-10 DIAGNOSIS — R0602 Shortness of breath: Secondary | ICD-10-CM | POA: Diagnosis not present

## 2016-09-10 DIAGNOSIS — F419 Anxiety disorder, unspecified: Secondary | ICD-10-CM | POA: Diagnosis not present

## 2016-09-11 DIAGNOSIS — G629 Polyneuropathy, unspecified: Secondary | ICD-10-CM | POA: Diagnosis not present

## 2016-09-11 DIAGNOSIS — G4700 Insomnia, unspecified: Secondary | ICD-10-CM | POA: Diagnosis not present

## 2016-09-11 DIAGNOSIS — F419 Anxiety disorder, unspecified: Secondary | ICD-10-CM | POA: Diagnosis not present

## 2016-09-11 DIAGNOSIS — K5900 Constipation, unspecified: Secondary | ICD-10-CM | POA: Diagnosis not present

## 2016-09-11 DIAGNOSIS — D649 Anemia, unspecified: Secondary | ICD-10-CM | POA: Diagnosis not present

## 2016-09-11 DIAGNOSIS — J441 Chronic obstructive pulmonary disease with (acute) exacerbation: Secondary | ICD-10-CM | POA: Diagnosis not present

## 2016-09-12 DIAGNOSIS — K5900 Constipation, unspecified: Secondary | ICD-10-CM | POA: Diagnosis not present

## 2016-09-12 DIAGNOSIS — F419 Anxiety disorder, unspecified: Secondary | ICD-10-CM | POA: Diagnosis not present

## 2016-09-12 DIAGNOSIS — J441 Chronic obstructive pulmonary disease with (acute) exacerbation: Secondary | ICD-10-CM | POA: Diagnosis not present

## 2016-09-12 DIAGNOSIS — D649 Anemia, unspecified: Secondary | ICD-10-CM | POA: Diagnosis not present

## 2016-09-12 DIAGNOSIS — G629 Polyneuropathy, unspecified: Secondary | ICD-10-CM | POA: Diagnosis not present

## 2016-09-12 DIAGNOSIS — G4700 Insomnia, unspecified: Secondary | ICD-10-CM | POA: Diagnosis not present

## 2016-09-13 DIAGNOSIS — F419 Anxiety disorder, unspecified: Secondary | ICD-10-CM | POA: Diagnosis not present

## 2016-09-13 DIAGNOSIS — F329 Major depressive disorder, single episode, unspecified: Secondary | ICD-10-CM | POA: Diagnosis not present

## 2016-09-13 DIAGNOSIS — G4700 Insomnia, unspecified: Secondary | ICD-10-CM | POA: Diagnosis not present

## 2016-09-14 DIAGNOSIS — D649 Anemia, unspecified: Secondary | ICD-10-CM | POA: Diagnosis not present

## 2016-09-14 DIAGNOSIS — J441 Chronic obstructive pulmonary disease with (acute) exacerbation: Secondary | ICD-10-CM | POA: Diagnosis not present

## 2016-09-14 DIAGNOSIS — F419 Anxiety disorder, unspecified: Secondary | ICD-10-CM | POA: Diagnosis not present

## 2016-09-14 DIAGNOSIS — G629 Polyneuropathy, unspecified: Secondary | ICD-10-CM | POA: Diagnosis not present

## 2016-09-14 DIAGNOSIS — G4700 Insomnia, unspecified: Secondary | ICD-10-CM | POA: Diagnosis not present

## 2016-09-14 DIAGNOSIS — K5900 Constipation, unspecified: Secondary | ICD-10-CM | POA: Diagnosis not present

## 2016-09-19 DIAGNOSIS — J441 Chronic obstructive pulmonary disease with (acute) exacerbation: Secondary | ICD-10-CM | POA: Diagnosis not present

## 2016-09-19 DIAGNOSIS — F329 Major depressive disorder, single episode, unspecified: Secondary | ICD-10-CM | POA: Diagnosis not present

## 2016-09-19 DIAGNOSIS — K219 Gastro-esophageal reflux disease without esophagitis: Secondary | ICD-10-CM | POA: Diagnosis not present

## 2016-09-19 DIAGNOSIS — G4700 Insomnia, unspecified: Secondary | ICD-10-CM | POA: Diagnosis not present

## 2016-09-19 DIAGNOSIS — D649 Anemia, unspecified: Secondary | ICD-10-CM | POA: Diagnosis not present

## 2016-09-19 DIAGNOSIS — K5900 Constipation, unspecified: Secondary | ICD-10-CM | POA: Diagnosis not present

## 2016-09-19 DIAGNOSIS — F0390 Unspecified dementia without behavioral disturbance: Secondary | ICD-10-CM | POA: Diagnosis not present

## 2016-09-19 DIAGNOSIS — F419 Anxiety disorder, unspecified: Secondary | ICD-10-CM | POA: Diagnosis not present

## 2016-09-19 DIAGNOSIS — G629 Polyneuropathy, unspecified: Secondary | ICD-10-CM | POA: Diagnosis not present

## 2016-09-19 DIAGNOSIS — J449 Chronic obstructive pulmonary disease, unspecified: Secondary | ICD-10-CM | POA: Diagnosis not present

## 2016-09-20 DIAGNOSIS — K5900 Constipation, unspecified: Secondary | ICD-10-CM | POA: Diagnosis not present

## 2016-09-20 DIAGNOSIS — G4700 Insomnia, unspecified: Secondary | ICD-10-CM | POA: Diagnosis not present

## 2016-09-20 DIAGNOSIS — D649 Anemia, unspecified: Secondary | ICD-10-CM | POA: Diagnosis not present

## 2016-09-20 DIAGNOSIS — G629 Polyneuropathy, unspecified: Secondary | ICD-10-CM | POA: Diagnosis not present

## 2016-09-20 DIAGNOSIS — R0602 Shortness of breath: Secondary | ICD-10-CM | POA: Diagnosis not present

## 2016-09-20 DIAGNOSIS — R9431 Abnormal electrocardiogram [ECG] [EKG]: Secondary | ICD-10-CM | POA: Diagnosis not present

## 2016-09-20 DIAGNOSIS — R079 Chest pain, unspecified: Secondary | ICD-10-CM | POA: Diagnosis not present

## 2016-09-20 DIAGNOSIS — I4510 Unspecified right bundle-branch block: Secondary | ICD-10-CM | POA: Diagnosis not present

## 2016-09-20 DIAGNOSIS — F419 Anxiety disorder, unspecified: Secondary | ICD-10-CM | POA: Diagnosis not present

## 2016-09-20 DIAGNOSIS — R109 Unspecified abdominal pain: Secondary | ICD-10-CM | POA: Diagnosis not present

## 2016-09-20 DIAGNOSIS — R0789 Other chest pain: Secondary | ICD-10-CM | POA: Diagnosis not present

## 2016-09-20 DIAGNOSIS — J441 Chronic obstructive pulmonary disease with (acute) exacerbation: Secondary | ICD-10-CM | POA: Diagnosis not present

## 2016-09-21 DIAGNOSIS — R531 Weakness: Secondary | ICD-10-CM | POA: Diagnosis not present

## 2016-09-21 DIAGNOSIS — D649 Anemia, unspecified: Secondary | ICD-10-CM | POA: Diagnosis not present

## 2016-09-21 DIAGNOSIS — K5900 Constipation, unspecified: Secondary | ICD-10-CM | POA: Diagnosis not present

## 2016-09-21 DIAGNOSIS — R079 Chest pain, unspecified: Secondary | ICD-10-CM | POA: Diagnosis not present

## 2016-09-21 DIAGNOSIS — G629 Polyneuropathy, unspecified: Secondary | ICD-10-CM | POA: Diagnosis not present

## 2016-09-21 DIAGNOSIS — G4700 Insomnia, unspecified: Secondary | ICD-10-CM | POA: Diagnosis not present

## 2016-09-21 DIAGNOSIS — J441 Chronic obstructive pulmonary disease with (acute) exacerbation: Secondary | ICD-10-CM | POA: Diagnosis not present

## 2016-09-21 DIAGNOSIS — F419 Anxiety disorder, unspecified: Secondary | ICD-10-CM | POA: Diagnosis not present

## 2016-09-21 DIAGNOSIS — J449 Chronic obstructive pulmonary disease, unspecified: Secondary | ICD-10-CM | POA: Diagnosis not present

## 2016-09-21 DIAGNOSIS — Z9981 Dependence on supplemental oxygen: Secondary | ICD-10-CM | POA: Diagnosis not present

## 2016-09-21 DIAGNOSIS — J9801 Acute bronchospasm: Secondary | ICD-10-CM | POA: Diagnosis not present

## 2016-09-26 DIAGNOSIS — G629 Polyneuropathy, unspecified: Secondary | ICD-10-CM | POA: Diagnosis not present

## 2016-09-26 DIAGNOSIS — D649 Anemia, unspecified: Secondary | ICD-10-CM | POA: Diagnosis not present

## 2016-09-26 DIAGNOSIS — K5900 Constipation, unspecified: Secondary | ICD-10-CM | POA: Diagnosis not present

## 2016-09-26 DIAGNOSIS — G4700 Insomnia, unspecified: Secondary | ICD-10-CM | POA: Diagnosis not present

## 2016-09-26 DIAGNOSIS — F419 Anxiety disorder, unspecified: Secondary | ICD-10-CM | POA: Diagnosis not present

## 2016-09-26 DIAGNOSIS — J441 Chronic obstructive pulmonary disease with (acute) exacerbation: Secondary | ICD-10-CM | POA: Diagnosis not present

## 2016-09-28 DIAGNOSIS — F419 Anxiety disorder, unspecified: Secondary | ICD-10-CM | POA: Diagnosis not present

## 2016-09-28 DIAGNOSIS — D649 Anemia, unspecified: Secondary | ICD-10-CM | POA: Diagnosis not present

## 2016-09-28 DIAGNOSIS — J441 Chronic obstructive pulmonary disease with (acute) exacerbation: Secondary | ICD-10-CM | POA: Diagnosis not present

## 2016-09-28 DIAGNOSIS — G4700 Insomnia, unspecified: Secondary | ICD-10-CM | POA: Diagnosis not present

## 2016-09-28 DIAGNOSIS — G629 Polyneuropathy, unspecified: Secondary | ICD-10-CM | POA: Diagnosis not present

## 2016-09-28 DIAGNOSIS — K5900 Constipation, unspecified: Secondary | ICD-10-CM | POA: Diagnosis not present

## 2016-09-30 DIAGNOSIS — R110 Nausea: Secondary | ICD-10-CM | POA: Diagnosis not present

## 2016-09-30 DIAGNOSIS — Z79899 Other long term (current) drug therapy: Secondary | ICD-10-CM | POA: Diagnosis not present

## 2016-09-30 DIAGNOSIS — F419 Anxiety disorder, unspecified: Secondary | ICD-10-CM | POA: Diagnosis not present

## 2016-09-30 DIAGNOSIS — F329 Major depressive disorder, single episode, unspecified: Secondary | ICD-10-CM | POA: Diagnosis not present

## 2016-09-30 DIAGNOSIS — Z87891 Personal history of nicotine dependence: Secondary | ICD-10-CM | POA: Diagnosis not present

## 2016-09-30 DIAGNOSIS — K5900 Constipation, unspecified: Secondary | ICD-10-CM | POA: Diagnosis not present

## 2016-09-30 DIAGNOSIS — Z8673 Personal history of transient ischemic attack (TIA), and cerebral infarction without residual deficits: Secondary | ICD-10-CM | POA: Diagnosis not present

## 2016-09-30 DIAGNOSIS — J449 Chronic obstructive pulmonary disease, unspecified: Secondary | ICD-10-CM | POA: Diagnosis not present

## 2016-09-30 DIAGNOSIS — I10 Essential (primary) hypertension: Secondary | ICD-10-CM | POA: Diagnosis not present

## 2016-10-01 DIAGNOSIS — Z9981 Dependence on supplemental oxygen: Secondary | ICD-10-CM | POA: Diagnosis not present

## 2016-10-01 DIAGNOSIS — R531 Weakness: Secondary | ICD-10-CM | POA: Diagnosis not present

## 2016-10-01 DIAGNOSIS — J449 Chronic obstructive pulmonary disease, unspecified: Secondary | ICD-10-CM | POA: Diagnosis not present

## 2016-10-02 DIAGNOSIS — J441 Chronic obstructive pulmonary disease with (acute) exacerbation: Secondary | ICD-10-CM | POA: Diagnosis not present

## 2016-10-02 DIAGNOSIS — K5900 Constipation, unspecified: Secondary | ICD-10-CM | POA: Diagnosis not present

## 2016-10-02 DIAGNOSIS — G4700 Insomnia, unspecified: Secondary | ICD-10-CM | POA: Diagnosis not present

## 2016-10-02 DIAGNOSIS — G629 Polyneuropathy, unspecified: Secondary | ICD-10-CM | POA: Diagnosis not present

## 2016-10-02 DIAGNOSIS — F419 Anxiety disorder, unspecified: Secondary | ICD-10-CM | POA: Diagnosis not present

## 2016-10-02 DIAGNOSIS — D649 Anemia, unspecified: Secondary | ICD-10-CM | POA: Diagnosis not present

## 2016-10-04 DIAGNOSIS — F419 Anxiety disorder, unspecified: Secondary | ICD-10-CM | POA: Diagnosis not present

## 2016-10-04 DIAGNOSIS — D649 Anemia, unspecified: Secondary | ICD-10-CM | POA: Diagnosis not present

## 2016-10-04 DIAGNOSIS — J441 Chronic obstructive pulmonary disease with (acute) exacerbation: Secondary | ICD-10-CM | POA: Diagnosis not present

## 2016-10-04 DIAGNOSIS — K5900 Constipation, unspecified: Secondary | ICD-10-CM | POA: Diagnosis not present

## 2016-10-04 DIAGNOSIS — G629 Polyneuropathy, unspecified: Secondary | ICD-10-CM | POA: Diagnosis not present

## 2016-10-04 DIAGNOSIS — G4700 Insomnia, unspecified: Secondary | ICD-10-CM | POA: Diagnosis not present

## 2016-10-07 DIAGNOSIS — K219 Gastro-esophageal reflux disease without esophagitis: Secondary | ICD-10-CM | POA: Diagnosis not present

## 2016-10-07 DIAGNOSIS — Z515 Encounter for palliative care: Secondary | ICD-10-CM | POA: Diagnosis not present

## 2016-10-07 DIAGNOSIS — G629 Polyneuropathy, unspecified: Secondary | ICD-10-CM | POA: Diagnosis not present

## 2016-10-07 DIAGNOSIS — K5900 Constipation, unspecified: Secondary | ICD-10-CM | POA: Diagnosis not present

## 2016-10-07 DIAGNOSIS — G4700 Insomnia, unspecified: Secondary | ICD-10-CM | POA: Diagnosis not present

## 2016-10-07 DIAGNOSIS — D649 Anemia, unspecified: Secondary | ICD-10-CM | POA: Diagnosis not present

## 2016-10-07 DIAGNOSIS — F419 Anxiety disorder, unspecified: Secondary | ICD-10-CM | POA: Diagnosis not present

## 2016-10-07 DIAGNOSIS — J441 Chronic obstructive pulmonary disease with (acute) exacerbation: Secondary | ICD-10-CM | POA: Diagnosis not present

## 2016-10-08 DIAGNOSIS — K5900 Constipation, unspecified: Secondary | ICD-10-CM | POA: Diagnosis not present

## 2016-10-08 DIAGNOSIS — J441 Chronic obstructive pulmonary disease with (acute) exacerbation: Secondary | ICD-10-CM | POA: Diagnosis not present

## 2016-10-08 DIAGNOSIS — D649 Anemia, unspecified: Secondary | ICD-10-CM | POA: Diagnosis not present

## 2016-10-08 DIAGNOSIS — F419 Anxiety disorder, unspecified: Secondary | ICD-10-CM | POA: Diagnosis not present

## 2016-10-08 DIAGNOSIS — G4700 Insomnia, unspecified: Secondary | ICD-10-CM | POA: Diagnosis not present

## 2016-10-08 DIAGNOSIS — G629 Polyneuropathy, unspecified: Secondary | ICD-10-CM | POA: Diagnosis not present

## 2016-10-09 DIAGNOSIS — G629 Polyneuropathy, unspecified: Secondary | ICD-10-CM | POA: Diagnosis not present

## 2016-10-09 DIAGNOSIS — F419 Anxiety disorder, unspecified: Secondary | ICD-10-CM | POA: Diagnosis not present

## 2016-10-09 DIAGNOSIS — J441 Chronic obstructive pulmonary disease with (acute) exacerbation: Secondary | ICD-10-CM | POA: Diagnosis not present

## 2016-10-09 DIAGNOSIS — K5900 Constipation, unspecified: Secondary | ICD-10-CM | POA: Diagnosis not present

## 2016-10-09 DIAGNOSIS — D649 Anemia, unspecified: Secondary | ICD-10-CM | POA: Diagnosis not present

## 2016-10-09 DIAGNOSIS — G4700 Insomnia, unspecified: Secondary | ICD-10-CM | POA: Diagnosis not present

## 2016-10-11 DIAGNOSIS — F419 Anxiety disorder, unspecified: Secondary | ICD-10-CM | POA: Diagnosis not present

## 2016-10-11 DIAGNOSIS — D649 Anemia, unspecified: Secondary | ICD-10-CM | POA: Diagnosis not present

## 2016-10-11 DIAGNOSIS — J441 Chronic obstructive pulmonary disease with (acute) exacerbation: Secondary | ICD-10-CM | POA: Diagnosis not present

## 2016-10-11 DIAGNOSIS — K5900 Constipation, unspecified: Secondary | ICD-10-CM | POA: Diagnosis not present

## 2016-10-11 DIAGNOSIS — G629 Polyneuropathy, unspecified: Secondary | ICD-10-CM | POA: Diagnosis not present

## 2016-10-11 DIAGNOSIS — G4700 Insomnia, unspecified: Secondary | ICD-10-CM | POA: Diagnosis not present

## 2016-10-12 DIAGNOSIS — G629 Polyneuropathy, unspecified: Secondary | ICD-10-CM | POA: Diagnosis not present

## 2016-10-12 DIAGNOSIS — D649 Anemia, unspecified: Secondary | ICD-10-CM | POA: Diagnosis not present

## 2016-10-12 DIAGNOSIS — K5900 Constipation, unspecified: Secondary | ICD-10-CM | POA: Diagnosis not present

## 2016-10-12 DIAGNOSIS — G4700 Insomnia, unspecified: Secondary | ICD-10-CM | POA: Diagnosis not present

## 2016-10-12 DIAGNOSIS — F419 Anxiety disorder, unspecified: Secondary | ICD-10-CM | POA: Diagnosis not present

## 2016-10-12 DIAGNOSIS — J441 Chronic obstructive pulmonary disease with (acute) exacerbation: Secondary | ICD-10-CM | POA: Diagnosis not present

## 2016-10-15 DIAGNOSIS — D649 Anemia, unspecified: Secondary | ICD-10-CM | POA: Diagnosis not present

## 2016-10-15 DIAGNOSIS — K5900 Constipation, unspecified: Secondary | ICD-10-CM | POA: Diagnosis not present

## 2016-10-15 DIAGNOSIS — G629 Polyneuropathy, unspecified: Secondary | ICD-10-CM | POA: Diagnosis not present

## 2016-10-15 DIAGNOSIS — J441 Chronic obstructive pulmonary disease with (acute) exacerbation: Secondary | ICD-10-CM | POA: Diagnosis not present

## 2016-10-15 DIAGNOSIS — G4700 Insomnia, unspecified: Secondary | ICD-10-CM | POA: Diagnosis not present

## 2016-10-15 DIAGNOSIS — F419 Anxiety disorder, unspecified: Secondary | ICD-10-CM | POA: Diagnosis not present

## 2016-10-16 DIAGNOSIS — J441 Chronic obstructive pulmonary disease with (acute) exacerbation: Secondary | ICD-10-CM | POA: Diagnosis not present

## 2016-10-16 DIAGNOSIS — G629 Polyneuropathy, unspecified: Secondary | ICD-10-CM | POA: Diagnosis not present

## 2016-10-16 DIAGNOSIS — K5900 Constipation, unspecified: Secondary | ICD-10-CM | POA: Diagnosis not present

## 2016-10-16 DIAGNOSIS — G4700 Insomnia, unspecified: Secondary | ICD-10-CM | POA: Diagnosis not present

## 2016-10-16 DIAGNOSIS — F419 Anxiety disorder, unspecified: Secondary | ICD-10-CM | POA: Diagnosis not present

## 2016-10-16 DIAGNOSIS — D649 Anemia, unspecified: Secondary | ICD-10-CM | POA: Diagnosis not present

## 2016-10-17 DIAGNOSIS — F419 Anxiety disorder, unspecified: Secondary | ICD-10-CM | POA: Diagnosis not present

## 2016-10-17 DIAGNOSIS — D649 Anemia, unspecified: Secondary | ICD-10-CM | POA: Diagnosis not present

## 2016-10-17 DIAGNOSIS — J441 Chronic obstructive pulmonary disease with (acute) exacerbation: Secondary | ICD-10-CM | POA: Diagnosis not present

## 2016-10-17 DIAGNOSIS — K5900 Constipation, unspecified: Secondary | ICD-10-CM | POA: Diagnosis not present

## 2016-10-17 DIAGNOSIS — G4700 Insomnia, unspecified: Secondary | ICD-10-CM | POA: Diagnosis not present

## 2016-10-17 DIAGNOSIS — G629 Polyneuropathy, unspecified: Secondary | ICD-10-CM | POA: Diagnosis not present

## 2016-10-18 DIAGNOSIS — D649 Anemia, unspecified: Secondary | ICD-10-CM | POA: Diagnosis not present

## 2016-10-18 DIAGNOSIS — F329 Major depressive disorder, single episode, unspecified: Secondary | ICD-10-CM | POA: Diagnosis not present

## 2016-10-18 DIAGNOSIS — G629 Polyneuropathy, unspecified: Secondary | ICD-10-CM | POA: Diagnosis not present

## 2016-10-18 DIAGNOSIS — J441 Chronic obstructive pulmonary disease with (acute) exacerbation: Secondary | ICD-10-CM | POA: Diagnosis not present

## 2016-10-18 DIAGNOSIS — K5900 Constipation, unspecified: Secondary | ICD-10-CM | POA: Diagnosis not present

## 2016-10-18 DIAGNOSIS — G4700 Insomnia, unspecified: Secondary | ICD-10-CM | POA: Diagnosis not present

## 2016-10-18 DIAGNOSIS — F419 Anxiety disorder, unspecified: Secondary | ICD-10-CM | POA: Diagnosis not present

## 2016-10-19 DIAGNOSIS — K5900 Constipation, unspecified: Secondary | ICD-10-CM | POA: Diagnosis not present

## 2016-10-19 DIAGNOSIS — F419 Anxiety disorder, unspecified: Secondary | ICD-10-CM | POA: Diagnosis not present

## 2016-10-19 DIAGNOSIS — D649 Anemia, unspecified: Secondary | ICD-10-CM | POA: Diagnosis not present

## 2016-10-19 DIAGNOSIS — G629 Polyneuropathy, unspecified: Secondary | ICD-10-CM | POA: Diagnosis not present

## 2016-10-19 DIAGNOSIS — G4700 Insomnia, unspecified: Secondary | ICD-10-CM | POA: Diagnosis not present

## 2016-10-19 DIAGNOSIS — J441 Chronic obstructive pulmonary disease with (acute) exacerbation: Secondary | ICD-10-CM | POA: Diagnosis not present

## 2016-10-22 DIAGNOSIS — K5900 Constipation, unspecified: Secondary | ICD-10-CM | POA: Diagnosis not present

## 2016-10-22 DIAGNOSIS — D649 Anemia, unspecified: Secondary | ICD-10-CM | POA: Diagnosis not present

## 2016-10-22 DIAGNOSIS — F419 Anxiety disorder, unspecified: Secondary | ICD-10-CM | POA: Diagnosis not present

## 2016-10-22 DIAGNOSIS — G4700 Insomnia, unspecified: Secondary | ICD-10-CM | POA: Diagnosis not present

## 2016-10-22 DIAGNOSIS — J441 Chronic obstructive pulmonary disease with (acute) exacerbation: Secondary | ICD-10-CM | POA: Diagnosis not present

## 2016-10-22 DIAGNOSIS — G629 Polyneuropathy, unspecified: Secondary | ICD-10-CM | POA: Diagnosis not present

## 2016-10-23 DIAGNOSIS — F419 Anxiety disorder, unspecified: Secondary | ICD-10-CM | POA: Diagnosis not present

## 2016-10-23 DIAGNOSIS — G629 Polyneuropathy, unspecified: Secondary | ICD-10-CM | POA: Diagnosis not present

## 2016-10-23 DIAGNOSIS — D649 Anemia, unspecified: Secondary | ICD-10-CM | POA: Diagnosis not present

## 2016-10-23 DIAGNOSIS — J441 Chronic obstructive pulmonary disease with (acute) exacerbation: Secondary | ICD-10-CM | POA: Diagnosis not present

## 2016-10-23 DIAGNOSIS — K5900 Constipation, unspecified: Secondary | ICD-10-CM | POA: Diagnosis not present

## 2016-10-23 DIAGNOSIS — G4700 Insomnia, unspecified: Secondary | ICD-10-CM | POA: Diagnosis not present

## 2016-10-24 DIAGNOSIS — K5900 Constipation, unspecified: Secondary | ICD-10-CM | POA: Diagnosis not present

## 2016-10-24 DIAGNOSIS — G629 Polyneuropathy, unspecified: Secondary | ICD-10-CM | POA: Diagnosis not present

## 2016-10-24 DIAGNOSIS — D649 Anemia, unspecified: Secondary | ICD-10-CM | POA: Diagnosis not present

## 2016-10-24 DIAGNOSIS — F419 Anxiety disorder, unspecified: Secondary | ICD-10-CM | POA: Diagnosis not present

## 2016-10-24 DIAGNOSIS — J441 Chronic obstructive pulmonary disease with (acute) exacerbation: Secondary | ICD-10-CM | POA: Diagnosis not present

## 2016-10-24 DIAGNOSIS — G4700 Insomnia, unspecified: Secondary | ICD-10-CM | POA: Diagnosis not present

## 2016-10-26 DIAGNOSIS — D649 Anemia, unspecified: Secondary | ICD-10-CM | POA: Diagnosis not present

## 2016-10-26 DIAGNOSIS — G4700 Insomnia, unspecified: Secondary | ICD-10-CM | POA: Diagnosis not present

## 2016-10-26 DIAGNOSIS — F419 Anxiety disorder, unspecified: Secondary | ICD-10-CM | POA: Diagnosis not present

## 2016-10-26 DIAGNOSIS — G629 Polyneuropathy, unspecified: Secondary | ICD-10-CM | POA: Diagnosis not present

## 2016-10-26 DIAGNOSIS — J441 Chronic obstructive pulmonary disease with (acute) exacerbation: Secondary | ICD-10-CM | POA: Diagnosis not present

## 2016-10-26 DIAGNOSIS — K5900 Constipation, unspecified: Secondary | ICD-10-CM | POA: Diagnosis not present

## 2016-10-27 DIAGNOSIS — D649 Anemia, unspecified: Secondary | ICD-10-CM | POA: Diagnosis not present

## 2016-10-27 DIAGNOSIS — F419 Anxiety disorder, unspecified: Secondary | ICD-10-CM | POA: Diagnosis not present

## 2016-10-27 DIAGNOSIS — K5900 Constipation, unspecified: Secondary | ICD-10-CM | POA: Diagnosis not present

## 2016-10-27 DIAGNOSIS — J441 Chronic obstructive pulmonary disease with (acute) exacerbation: Secondary | ICD-10-CM | POA: Diagnosis not present

## 2016-10-27 DIAGNOSIS — G629 Polyneuropathy, unspecified: Secondary | ICD-10-CM | POA: Diagnosis not present

## 2016-10-27 DIAGNOSIS — G4700 Insomnia, unspecified: Secondary | ICD-10-CM | POA: Diagnosis not present

## 2016-10-29 DIAGNOSIS — G629 Polyneuropathy, unspecified: Secondary | ICD-10-CM | POA: Diagnosis not present

## 2016-10-29 DIAGNOSIS — D649 Anemia, unspecified: Secondary | ICD-10-CM | POA: Diagnosis not present

## 2016-10-29 DIAGNOSIS — G4700 Insomnia, unspecified: Secondary | ICD-10-CM | POA: Diagnosis not present

## 2016-10-29 DIAGNOSIS — F419 Anxiety disorder, unspecified: Secondary | ICD-10-CM | POA: Diagnosis not present

## 2016-10-29 DIAGNOSIS — J441 Chronic obstructive pulmonary disease with (acute) exacerbation: Secondary | ICD-10-CM | POA: Diagnosis not present

## 2016-10-29 DIAGNOSIS — K5900 Constipation, unspecified: Secondary | ICD-10-CM | POA: Diagnosis not present

## 2016-10-30 DIAGNOSIS — D649 Anemia, unspecified: Secondary | ICD-10-CM | POA: Diagnosis not present

## 2016-10-30 DIAGNOSIS — G4700 Insomnia, unspecified: Secondary | ICD-10-CM | POA: Diagnosis not present

## 2016-10-30 DIAGNOSIS — J441 Chronic obstructive pulmonary disease with (acute) exacerbation: Secondary | ICD-10-CM | POA: Diagnosis not present

## 2016-10-30 DIAGNOSIS — G629 Polyneuropathy, unspecified: Secondary | ICD-10-CM | POA: Diagnosis not present

## 2016-10-30 DIAGNOSIS — F419 Anxiety disorder, unspecified: Secondary | ICD-10-CM | POA: Diagnosis not present

## 2016-10-30 DIAGNOSIS — K5900 Constipation, unspecified: Secondary | ICD-10-CM | POA: Diagnosis not present

## 2016-10-31 DIAGNOSIS — G4700 Insomnia, unspecified: Secondary | ICD-10-CM | POA: Diagnosis not present

## 2016-10-31 DIAGNOSIS — K5900 Constipation, unspecified: Secondary | ICD-10-CM | POA: Diagnosis not present

## 2016-10-31 DIAGNOSIS — D649 Anemia, unspecified: Secondary | ICD-10-CM | POA: Diagnosis not present

## 2016-10-31 DIAGNOSIS — J441 Chronic obstructive pulmonary disease with (acute) exacerbation: Secondary | ICD-10-CM | POA: Diagnosis not present

## 2016-10-31 DIAGNOSIS — F419 Anxiety disorder, unspecified: Secondary | ICD-10-CM | POA: Diagnosis not present

## 2016-10-31 DIAGNOSIS — G629 Polyneuropathy, unspecified: Secondary | ICD-10-CM | POA: Diagnosis not present

## 2016-11-01 DIAGNOSIS — I639 Cerebral infarction, unspecified: Secondary | ICD-10-CM | POA: Diagnosis not present

## 2016-11-01 DIAGNOSIS — S2231XA Fracture of one rib, right side, initial encounter for closed fracture: Secondary | ICD-10-CM | POA: Diagnosis not present

## 2016-11-01 DIAGNOSIS — J9622 Acute and chronic respiratory failure with hypercapnia: Secondary | ICD-10-CM | POA: Diagnosis not present

## 2016-11-01 DIAGNOSIS — R0600 Dyspnea, unspecified: Secondary | ICD-10-CM | POA: Diagnosis not present

## 2016-11-01 DIAGNOSIS — J441 Chronic obstructive pulmonary disease with (acute) exacerbation: Secondary | ICD-10-CM | POA: Diagnosis not present

## 2016-11-01 DIAGNOSIS — K219 Gastro-esophageal reflux disease without esophagitis: Secondary | ICD-10-CM | POA: Diagnosis not present

## 2016-11-01 DIAGNOSIS — S2249XA Multiple fractures of ribs, unspecified side, initial encounter for closed fracture: Secondary | ICD-10-CM | POA: Diagnosis not present

## 2016-11-01 DIAGNOSIS — F419 Anxiety disorder, unspecified: Secondary | ICD-10-CM | POA: Diagnosis not present

## 2016-11-01 DIAGNOSIS — R937 Abnormal findings on diagnostic imaging of other parts of musculoskeletal system: Secondary | ICD-10-CM | POA: Diagnosis not present

## 2016-11-01 DIAGNOSIS — R918 Other nonspecific abnormal finding of lung field: Secondary | ICD-10-CM | POA: Diagnosis not present

## 2016-11-01 DIAGNOSIS — J9811 Atelectasis: Secondary | ICD-10-CM | POA: Diagnosis not present

## 2016-11-01 DIAGNOSIS — R0602 Shortness of breath: Secondary | ICD-10-CM | POA: Diagnosis not present

## 2016-11-01 DIAGNOSIS — I10 Essential (primary) hypertension: Secondary | ICD-10-CM | POA: Diagnosis not present

## 2016-11-01 DIAGNOSIS — J449 Chronic obstructive pulmonary disease, unspecified: Secondary | ICD-10-CM | POA: Diagnosis not present

## 2016-11-01 DIAGNOSIS — R062 Wheezing: Secondary | ICD-10-CM | POA: Diagnosis not present

## 2016-11-02 DIAGNOSIS — K5900 Constipation, unspecified: Secondary | ICD-10-CM | POA: Diagnosis not present

## 2016-11-02 DIAGNOSIS — G629 Polyneuropathy, unspecified: Secondary | ICD-10-CM | POA: Diagnosis not present

## 2016-11-02 DIAGNOSIS — J439 Emphysema, unspecified: Secondary | ICD-10-CM | POA: Diagnosis not present

## 2016-11-02 DIAGNOSIS — F99 Mental disorder, not otherwise specified: Secondary | ICD-10-CM | POA: Diagnosis not present

## 2016-11-02 DIAGNOSIS — I10 Essential (primary) hypertension: Secondary | ICD-10-CM | POA: Diagnosis not present

## 2016-11-02 DIAGNOSIS — F419 Anxiety disorder, unspecified: Secondary | ICD-10-CM | POA: Diagnosis not present

## 2016-11-02 DIAGNOSIS — R5381 Other malaise: Secondary | ICD-10-CM | POA: Diagnosis not present

## 2016-11-02 DIAGNOSIS — D649 Anemia, unspecified: Secondary | ICD-10-CM | POA: Diagnosis not present

## 2016-11-02 DIAGNOSIS — R279 Unspecified lack of coordination: Secondary | ICD-10-CM | POA: Diagnosis not present

## 2016-11-02 DIAGNOSIS — G4700 Insomnia, unspecified: Secondary | ICD-10-CM | POA: Diagnosis not present

## 2016-11-02 DIAGNOSIS — J441 Chronic obstructive pulmonary disease with (acute) exacerbation: Secondary | ICD-10-CM | POA: Diagnosis not present

## 2016-11-02 DIAGNOSIS — J9620 Acute and chronic respiratory failure, unspecified whether with hypoxia or hypercapnia: Secondary | ICD-10-CM | POA: Diagnosis not present

## 2016-11-03 DIAGNOSIS — F419 Anxiety disorder, unspecified: Secondary | ICD-10-CM | POA: Diagnosis not present

## 2016-11-03 DIAGNOSIS — G4700 Insomnia, unspecified: Secondary | ICD-10-CM | POA: Diagnosis not present

## 2016-11-03 DIAGNOSIS — K5900 Constipation, unspecified: Secondary | ICD-10-CM | POA: Diagnosis not present

## 2016-11-03 DIAGNOSIS — G629 Polyneuropathy, unspecified: Secondary | ICD-10-CM | POA: Diagnosis not present

## 2016-11-03 DIAGNOSIS — D649 Anemia, unspecified: Secondary | ICD-10-CM | POA: Diagnosis not present

## 2016-11-03 DIAGNOSIS — J441 Chronic obstructive pulmonary disease with (acute) exacerbation: Secondary | ICD-10-CM | POA: Diagnosis not present

## 2016-11-05 DIAGNOSIS — K5900 Constipation, unspecified: Secondary | ICD-10-CM | POA: Diagnosis not present

## 2016-11-05 DIAGNOSIS — G629 Polyneuropathy, unspecified: Secondary | ICD-10-CM | POA: Diagnosis not present

## 2016-11-05 DIAGNOSIS — R4182 Altered mental status, unspecified: Secondary | ICD-10-CM | POA: Diagnosis not present

## 2016-11-05 DIAGNOSIS — F419 Anxiety disorder, unspecified: Secondary | ICD-10-CM | POA: Diagnosis not present

## 2016-11-05 DIAGNOSIS — D649 Anemia, unspecified: Secondary | ICD-10-CM | POA: Diagnosis not present

## 2016-11-05 DIAGNOSIS — G4700 Insomnia, unspecified: Secondary | ICD-10-CM | POA: Diagnosis not present

## 2016-11-05 DIAGNOSIS — Z4789 Encounter for other orthopedic aftercare: Secondary | ICD-10-CM | POA: Diagnosis not present

## 2016-11-05 DIAGNOSIS — J441 Chronic obstructive pulmonary disease with (acute) exacerbation: Secondary | ICD-10-CM | POA: Diagnosis not present

## 2016-11-06 DIAGNOSIS — K5900 Constipation, unspecified: Secondary | ICD-10-CM | POA: Diagnosis not present

## 2016-11-06 DIAGNOSIS — D649 Anemia, unspecified: Secondary | ICD-10-CM | POA: Diagnosis not present

## 2016-11-06 DIAGNOSIS — G629 Polyneuropathy, unspecified: Secondary | ICD-10-CM | POA: Diagnosis not present

## 2016-11-06 DIAGNOSIS — F419 Anxiety disorder, unspecified: Secondary | ICD-10-CM | POA: Diagnosis not present

## 2016-11-06 DIAGNOSIS — G4700 Insomnia, unspecified: Secondary | ICD-10-CM | POA: Diagnosis not present

## 2016-11-06 DIAGNOSIS — R531 Weakness: Secondary | ICD-10-CM | POA: Diagnosis not present

## 2016-11-06 DIAGNOSIS — J441 Chronic obstructive pulmonary disease with (acute) exacerbation: Secondary | ICD-10-CM | POA: Diagnosis not present

## 2016-11-07 DIAGNOSIS — G4700 Insomnia, unspecified: Secondary | ICD-10-CM | POA: Diagnosis not present

## 2016-11-07 DIAGNOSIS — F419 Anxiety disorder, unspecified: Secondary | ICD-10-CM | POA: Diagnosis not present

## 2016-11-07 DIAGNOSIS — J441 Chronic obstructive pulmonary disease with (acute) exacerbation: Secondary | ICD-10-CM | POA: Diagnosis not present

## 2016-11-07 DIAGNOSIS — K219 Gastro-esophageal reflux disease without esophagitis: Secondary | ICD-10-CM | POA: Diagnosis not present

## 2016-11-07 DIAGNOSIS — K5900 Constipation, unspecified: Secondary | ICD-10-CM | POA: Diagnosis not present

## 2016-11-07 DIAGNOSIS — Z515 Encounter for palliative care: Secondary | ICD-10-CM | POA: Diagnosis not present

## 2016-11-07 DIAGNOSIS — G629 Polyneuropathy, unspecified: Secondary | ICD-10-CM | POA: Diagnosis not present

## 2016-11-07 DIAGNOSIS — D649 Anemia, unspecified: Secondary | ICD-10-CM | POA: Diagnosis not present

## 2016-11-08 DIAGNOSIS — D649 Anemia, unspecified: Secondary | ICD-10-CM | POA: Diagnosis not present

## 2016-11-08 DIAGNOSIS — K5900 Constipation, unspecified: Secondary | ICD-10-CM | POA: Diagnosis not present

## 2016-11-08 DIAGNOSIS — F329 Major depressive disorder, single episode, unspecified: Secondary | ICD-10-CM | POA: Diagnosis not present

## 2016-11-08 DIAGNOSIS — G4700 Insomnia, unspecified: Secondary | ICD-10-CM | POA: Diagnosis not present

## 2016-11-08 DIAGNOSIS — G629 Polyneuropathy, unspecified: Secondary | ICD-10-CM | POA: Diagnosis not present

## 2016-11-08 DIAGNOSIS — F419 Anxiety disorder, unspecified: Secondary | ICD-10-CM | POA: Diagnosis not present

## 2016-11-08 DIAGNOSIS — J441 Chronic obstructive pulmonary disease with (acute) exacerbation: Secondary | ICD-10-CM | POA: Diagnosis not present

## 2016-11-08 DIAGNOSIS — F411 Generalized anxiety disorder: Secondary | ICD-10-CM | POA: Diagnosis not present

## 2016-11-09 DIAGNOSIS — J441 Chronic obstructive pulmonary disease with (acute) exacerbation: Secondary | ICD-10-CM | POA: Diagnosis not present

## 2016-11-09 DIAGNOSIS — G629 Polyneuropathy, unspecified: Secondary | ICD-10-CM | POA: Diagnosis not present

## 2016-11-09 DIAGNOSIS — D649 Anemia, unspecified: Secondary | ICD-10-CM | POA: Diagnosis not present

## 2016-11-09 DIAGNOSIS — K5900 Constipation, unspecified: Secondary | ICD-10-CM | POA: Diagnosis not present

## 2016-11-09 DIAGNOSIS — G4700 Insomnia, unspecified: Secondary | ICD-10-CM | POA: Diagnosis not present

## 2016-11-09 DIAGNOSIS — F419 Anxiety disorder, unspecified: Secondary | ICD-10-CM | POA: Diagnosis not present

## 2016-11-10 DIAGNOSIS — G4700 Insomnia, unspecified: Secondary | ICD-10-CM | POA: Diagnosis not present

## 2016-11-10 DIAGNOSIS — F419 Anxiety disorder, unspecified: Secondary | ICD-10-CM | POA: Diagnosis not present

## 2016-11-10 DIAGNOSIS — G629 Polyneuropathy, unspecified: Secondary | ICD-10-CM | POA: Diagnosis not present

## 2016-11-10 DIAGNOSIS — J441 Chronic obstructive pulmonary disease with (acute) exacerbation: Secondary | ICD-10-CM | POA: Diagnosis not present

## 2016-11-10 DIAGNOSIS — K5900 Constipation, unspecified: Secondary | ICD-10-CM | POA: Diagnosis not present

## 2016-11-10 DIAGNOSIS — D649 Anemia, unspecified: Secondary | ICD-10-CM | POA: Diagnosis not present

## 2016-11-12 DIAGNOSIS — G4700 Insomnia, unspecified: Secondary | ICD-10-CM | POA: Diagnosis not present

## 2016-11-12 DIAGNOSIS — F419 Anxiety disorder, unspecified: Secondary | ICD-10-CM | POA: Diagnosis not present

## 2016-11-12 DIAGNOSIS — G629 Polyneuropathy, unspecified: Secondary | ICD-10-CM | POA: Diagnosis not present

## 2016-11-12 DIAGNOSIS — D649 Anemia, unspecified: Secondary | ICD-10-CM | POA: Diagnosis not present

## 2016-11-12 DIAGNOSIS — K5900 Constipation, unspecified: Secondary | ICD-10-CM | POA: Diagnosis not present

## 2016-11-12 DIAGNOSIS — J441 Chronic obstructive pulmonary disease with (acute) exacerbation: Secondary | ICD-10-CM | POA: Diagnosis not present

## 2016-11-13 DIAGNOSIS — D649 Anemia, unspecified: Secondary | ICD-10-CM | POA: Diagnosis not present

## 2016-11-13 DIAGNOSIS — G629 Polyneuropathy, unspecified: Secondary | ICD-10-CM | POA: Diagnosis not present

## 2016-11-13 DIAGNOSIS — J441 Chronic obstructive pulmonary disease with (acute) exacerbation: Secondary | ICD-10-CM | POA: Diagnosis not present

## 2016-11-13 DIAGNOSIS — F419 Anxiety disorder, unspecified: Secondary | ICD-10-CM | POA: Diagnosis not present

## 2016-11-13 DIAGNOSIS — G4700 Insomnia, unspecified: Secondary | ICD-10-CM | POA: Diagnosis not present

## 2016-11-13 DIAGNOSIS — K5900 Constipation, unspecified: Secondary | ICD-10-CM | POA: Diagnosis not present

## 2016-11-14 DIAGNOSIS — D649 Anemia, unspecified: Secondary | ICD-10-CM | POA: Diagnosis not present

## 2016-11-14 DIAGNOSIS — G4700 Insomnia, unspecified: Secondary | ICD-10-CM | POA: Diagnosis not present

## 2016-11-14 DIAGNOSIS — K5900 Constipation, unspecified: Secondary | ICD-10-CM | POA: Diagnosis not present

## 2016-11-14 DIAGNOSIS — J441 Chronic obstructive pulmonary disease with (acute) exacerbation: Secondary | ICD-10-CM | POA: Diagnosis not present

## 2016-11-14 DIAGNOSIS — G629 Polyneuropathy, unspecified: Secondary | ICD-10-CM | POA: Diagnosis not present

## 2016-11-14 DIAGNOSIS — F419 Anxiety disorder, unspecified: Secondary | ICD-10-CM | POA: Diagnosis not present

## 2016-11-15 DIAGNOSIS — F419 Anxiety disorder, unspecified: Secondary | ICD-10-CM | POA: Diagnosis not present

## 2016-11-15 DIAGNOSIS — F411 Generalized anxiety disorder: Secondary | ICD-10-CM | POA: Diagnosis not present

## 2016-11-15 DIAGNOSIS — G4700 Insomnia, unspecified: Secondary | ICD-10-CM | POA: Diagnosis not present

## 2016-11-15 DIAGNOSIS — G629 Polyneuropathy, unspecified: Secondary | ICD-10-CM | POA: Diagnosis not present

## 2016-11-15 DIAGNOSIS — D649 Anemia, unspecified: Secondary | ICD-10-CM | POA: Diagnosis not present

## 2016-11-15 DIAGNOSIS — I119 Hypertensive heart disease without heart failure: Secondary | ICD-10-CM | POA: Diagnosis not present

## 2016-11-15 DIAGNOSIS — K5900 Constipation, unspecified: Secondary | ICD-10-CM | POA: Diagnosis not present

## 2016-11-15 DIAGNOSIS — J441 Chronic obstructive pulmonary disease with (acute) exacerbation: Secondary | ICD-10-CM | POA: Diagnosis not present

## 2016-11-15 DIAGNOSIS — F329 Major depressive disorder, single episode, unspecified: Secondary | ICD-10-CM | POA: Diagnosis not present

## 2016-11-16 DIAGNOSIS — G629 Polyneuropathy, unspecified: Secondary | ICD-10-CM | POA: Diagnosis not present

## 2016-11-16 DIAGNOSIS — J441 Chronic obstructive pulmonary disease with (acute) exacerbation: Secondary | ICD-10-CM | POA: Diagnosis not present

## 2016-11-16 DIAGNOSIS — G4700 Insomnia, unspecified: Secondary | ICD-10-CM | POA: Diagnosis not present

## 2016-11-16 DIAGNOSIS — K5900 Constipation, unspecified: Secondary | ICD-10-CM | POA: Diagnosis not present

## 2016-11-16 DIAGNOSIS — F419 Anxiety disorder, unspecified: Secondary | ICD-10-CM | POA: Diagnosis not present

## 2016-11-16 DIAGNOSIS — D649 Anemia, unspecified: Secondary | ICD-10-CM | POA: Diagnosis not present

## 2016-11-19 DIAGNOSIS — K5900 Constipation, unspecified: Secondary | ICD-10-CM | POA: Diagnosis not present

## 2016-11-19 DIAGNOSIS — G629 Polyneuropathy, unspecified: Secondary | ICD-10-CM | POA: Diagnosis not present

## 2016-11-19 DIAGNOSIS — J441 Chronic obstructive pulmonary disease with (acute) exacerbation: Secondary | ICD-10-CM | POA: Diagnosis not present

## 2016-11-19 DIAGNOSIS — F419 Anxiety disorder, unspecified: Secondary | ICD-10-CM | POA: Diagnosis not present

## 2016-11-19 DIAGNOSIS — D649 Anemia, unspecified: Secondary | ICD-10-CM | POA: Diagnosis not present

## 2016-11-19 DIAGNOSIS — G4700 Insomnia, unspecified: Secondary | ICD-10-CM | POA: Diagnosis not present

## 2016-11-20 DIAGNOSIS — D649 Anemia, unspecified: Secondary | ICD-10-CM | POA: Diagnosis not present

## 2016-11-20 DIAGNOSIS — G629 Polyneuropathy, unspecified: Secondary | ICD-10-CM | POA: Diagnosis not present

## 2016-11-20 DIAGNOSIS — J441 Chronic obstructive pulmonary disease with (acute) exacerbation: Secondary | ICD-10-CM | POA: Diagnosis not present

## 2016-11-20 DIAGNOSIS — F419 Anxiety disorder, unspecified: Secondary | ICD-10-CM | POA: Diagnosis not present

## 2016-11-20 DIAGNOSIS — G4700 Insomnia, unspecified: Secondary | ICD-10-CM | POA: Diagnosis not present

## 2016-11-20 DIAGNOSIS — K5900 Constipation, unspecified: Secondary | ICD-10-CM | POA: Diagnosis not present

## 2016-11-21 DIAGNOSIS — D649 Anemia, unspecified: Secondary | ICD-10-CM | POA: Diagnosis not present

## 2016-11-21 DIAGNOSIS — G4700 Insomnia, unspecified: Secondary | ICD-10-CM | POA: Diagnosis not present

## 2016-11-21 DIAGNOSIS — G629 Polyneuropathy, unspecified: Secondary | ICD-10-CM | POA: Diagnosis not present

## 2016-11-21 DIAGNOSIS — K5900 Constipation, unspecified: Secondary | ICD-10-CM | POA: Diagnosis not present

## 2016-11-21 DIAGNOSIS — J441 Chronic obstructive pulmonary disease with (acute) exacerbation: Secondary | ICD-10-CM | POA: Diagnosis not present

## 2016-11-21 DIAGNOSIS — F419 Anxiety disorder, unspecified: Secondary | ICD-10-CM | POA: Diagnosis not present

## 2016-11-22 DIAGNOSIS — G629 Polyneuropathy, unspecified: Secondary | ICD-10-CM | POA: Diagnosis not present

## 2016-11-22 DIAGNOSIS — D649 Anemia, unspecified: Secondary | ICD-10-CM | POA: Diagnosis not present

## 2016-11-22 DIAGNOSIS — F419 Anxiety disorder, unspecified: Secondary | ICD-10-CM | POA: Diagnosis not present

## 2016-11-22 DIAGNOSIS — K5900 Constipation, unspecified: Secondary | ICD-10-CM | POA: Diagnosis not present

## 2016-11-22 DIAGNOSIS — J441 Chronic obstructive pulmonary disease with (acute) exacerbation: Secondary | ICD-10-CM | POA: Diagnosis not present

## 2016-11-22 DIAGNOSIS — G4700 Insomnia, unspecified: Secondary | ICD-10-CM | POA: Diagnosis not present

## 2016-11-23 DIAGNOSIS — F419 Anxiety disorder, unspecified: Secondary | ICD-10-CM | POA: Diagnosis not present

## 2016-11-23 DIAGNOSIS — G4700 Insomnia, unspecified: Secondary | ICD-10-CM | POA: Diagnosis not present

## 2016-11-23 DIAGNOSIS — J441 Chronic obstructive pulmonary disease with (acute) exacerbation: Secondary | ICD-10-CM | POA: Diagnosis not present

## 2016-11-23 DIAGNOSIS — G629 Polyneuropathy, unspecified: Secondary | ICD-10-CM | POA: Diagnosis not present

## 2016-11-23 DIAGNOSIS — D649 Anemia, unspecified: Secondary | ICD-10-CM | POA: Diagnosis not present

## 2016-11-23 DIAGNOSIS — K5900 Constipation, unspecified: Secondary | ICD-10-CM | POA: Diagnosis not present

## 2016-11-26 DIAGNOSIS — K5900 Constipation, unspecified: Secondary | ICD-10-CM | POA: Diagnosis not present

## 2016-11-26 DIAGNOSIS — F419 Anxiety disorder, unspecified: Secondary | ICD-10-CM | POA: Diagnosis not present

## 2016-11-26 DIAGNOSIS — J441 Chronic obstructive pulmonary disease with (acute) exacerbation: Secondary | ICD-10-CM | POA: Diagnosis not present

## 2016-11-26 DIAGNOSIS — G4700 Insomnia, unspecified: Secondary | ICD-10-CM | POA: Diagnosis not present

## 2016-11-26 DIAGNOSIS — D649 Anemia, unspecified: Secondary | ICD-10-CM | POA: Diagnosis not present

## 2016-11-26 DIAGNOSIS — G629 Polyneuropathy, unspecified: Secondary | ICD-10-CM | POA: Diagnosis not present

## 2016-11-27 DIAGNOSIS — J441 Chronic obstructive pulmonary disease with (acute) exacerbation: Secondary | ICD-10-CM | POA: Diagnosis not present

## 2016-11-27 DIAGNOSIS — G629 Polyneuropathy, unspecified: Secondary | ICD-10-CM | POA: Diagnosis not present

## 2016-11-27 DIAGNOSIS — D649 Anemia, unspecified: Secondary | ICD-10-CM | POA: Diagnosis not present

## 2016-11-27 DIAGNOSIS — G4700 Insomnia, unspecified: Secondary | ICD-10-CM | POA: Diagnosis not present

## 2016-11-27 DIAGNOSIS — K5900 Constipation, unspecified: Secondary | ICD-10-CM | POA: Diagnosis not present

## 2016-11-27 DIAGNOSIS — F419 Anxiety disorder, unspecified: Secondary | ICD-10-CM | POA: Diagnosis not present

## 2016-11-28 DIAGNOSIS — G629 Polyneuropathy, unspecified: Secondary | ICD-10-CM | POA: Diagnosis not present

## 2016-11-28 DIAGNOSIS — K5900 Constipation, unspecified: Secondary | ICD-10-CM | POA: Diagnosis not present

## 2016-11-28 DIAGNOSIS — J441 Chronic obstructive pulmonary disease with (acute) exacerbation: Secondary | ICD-10-CM | POA: Diagnosis not present

## 2016-11-28 DIAGNOSIS — F419 Anxiety disorder, unspecified: Secondary | ICD-10-CM | POA: Diagnosis not present

## 2016-11-28 DIAGNOSIS — D649 Anemia, unspecified: Secondary | ICD-10-CM | POA: Diagnosis not present

## 2016-11-28 DIAGNOSIS — G4700 Insomnia, unspecified: Secondary | ICD-10-CM | POA: Diagnosis not present

## 2016-11-29 DIAGNOSIS — F419 Anxiety disorder, unspecified: Secondary | ICD-10-CM | POA: Diagnosis not present

## 2016-11-29 DIAGNOSIS — G629 Polyneuropathy, unspecified: Secondary | ICD-10-CM | POA: Diagnosis not present

## 2016-11-29 DIAGNOSIS — J441 Chronic obstructive pulmonary disease with (acute) exacerbation: Secondary | ICD-10-CM | POA: Diagnosis not present

## 2016-11-29 DIAGNOSIS — G4700 Insomnia, unspecified: Secondary | ICD-10-CM | POA: Diagnosis not present

## 2016-11-29 DIAGNOSIS — K5900 Constipation, unspecified: Secondary | ICD-10-CM | POA: Diagnosis not present

## 2016-11-29 DIAGNOSIS — D649 Anemia, unspecified: Secondary | ICD-10-CM | POA: Diagnosis not present

## 2016-11-30 DIAGNOSIS — J441 Chronic obstructive pulmonary disease with (acute) exacerbation: Secondary | ICD-10-CM | POA: Diagnosis not present

## 2016-11-30 DIAGNOSIS — D649 Anemia, unspecified: Secondary | ICD-10-CM | POA: Diagnosis not present

## 2016-11-30 DIAGNOSIS — G629 Polyneuropathy, unspecified: Secondary | ICD-10-CM | POA: Diagnosis not present

## 2016-11-30 DIAGNOSIS — F419 Anxiety disorder, unspecified: Secondary | ICD-10-CM | POA: Diagnosis not present

## 2016-11-30 DIAGNOSIS — G4700 Insomnia, unspecified: Secondary | ICD-10-CM | POA: Diagnosis not present

## 2016-11-30 DIAGNOSIS — K5900 Constipation, unspecified: Secondary | ICD-10-CM | POA: Diagnosis not present

## 2016-12-01 DIAGNOSIS — K5900 Constipation, unspecified: Secondary | ICD-10-CM | POA: Diagnosis not present

## 2016-12-01 DIAGNOSIS — G629 Polyneuropathy, unspecified: Secondary | ICD-10-CM | POA: Diagnosis not present

## 2016-12-01 DIAGNOSIS — F419 Anxiety disorder, unspecified: Secondary | ICD-10-CM | POA: Diagnosis not present

## 2016-12-01 DIAGNOSIS — J441 Chronic obstructive pulmonary disease with (acute) exacerbation: Secondary | ICD-10-CM | POA: Diagnosis not present

## 2016-12-01 DIAGNOSIS — G4700 Insomnia, unspecified: Secondary | ICD-10-CM | POA: Diagnosis not present

## 2016-12-01 DIAGNOSIS — D649 Anemia, unspecified: Secondary | ICD-10-CM | POA: Diagnosis not present

## 2016-12-04 DIAGNOSIS — G4700 Insomnia, unspecified: Secondary | ICD-10-CM | POA: Diagnosis not present

## 2016-12-04 DIAGNOSIS — G629 Polyneuropathy, unspecified: Secondary | ICD-10-CM | POA: Diagnosis not present

## 2016-12-04 DIAGNOSIS — D649 Anemia, unspecified: Secondary | ICD-10-CM | POA: Diagnosis not present

## 2016-12-04 DIAGNOSIS — K5900 Constipation, unspecified: Secondary | ICD-10-CM | POA: Diagnosis not present

## 2016-12-04 DIAGNOSIS — J441 Chronic obstructive pulmonary disease with (acute) exacerbation: Secondary | ICD-10-CM | POA: Diagnosis not present

## 2016-12-04 DIAGNOSIS — F419 Anxiety disorder, unspecified: Secondary | ICD-10-CM | POA: Diagnosis not present

## 2016-12-05 DIAGNOSIS — G4700 Insomnia, unspecified: Secondary | ICD-10-CM | POA: Diagnosis not present

## 2016-12-05 DIAGNOSIS — D649 Anemia, unspecified: Secondary | ICD-10-CM | POA: Diagnosis not present

## 2016-12-05 DIAGNOSIS — G629 Polyneuropathy, unspecified: Secondary | ICD-10-CM | POA: Diagnosis not present

## 2016-12-05 DIAGNOSIS — K5900 Constipation, unspecified: Secondary | ICD-10-CM | POA: Diagnosis not present

## 2016-12-05 DIAGNOSIS — J441 Chronic obstructive pulmonary disease with (acute) exacerbation: Secondary | ICD-10-CM | POA: Diagnosis not present

## 2016-12-05 DIAGNOSIS — F419 Anxiety disorder, unspecified: Secondary | ICD-10-CM | POA: Diagnosis not present

## 2016-12-06 DIAGNOSIS — F329 Major depressive disorder, single episode, unspecified: Secondary | ICD-10-CM | POA: Diagnosis not present

## 2016-12-06 DIAGNOSIS — F419 Anxiety disorder, unspecified: Secondary | ICD-10-CM | POA: Diagnosis not present

## 2016-12-06 DIAGNOSIS — J441 Chronic obstructive pulmonary disease with (acute) exacerbation: Secondary | ICD-10-CM | POA: Diagnosis not present

## 2016-12-06 DIAGNOSIS — G629 Polyneuropathy, unspecified: Secondary | ICD-10-CM | POA: Diagnosis not present

## 2016-12-06 DIAGNOSIS — K5900 Constipation, unspecified: Secondary | ICD-10-CM | POA: Diagnosis not present

## 2016-12-06 DIAGNOSIS — G4700 Insomnia, unspecified: Secondary | ICD-10-CM | POA: Diagnosis not present

## 2016-12-06 DIAGNOSIS — D649 Anemia, unspecified: Secondary | ICD-10-CM | POA: Diagnosis not present

## 2016-12-07 DIAGNOSIS — J441 Chronic obstructive pulmonary disease with (acute) exacerbation: Secondary | ICD-10-CM | POA: Diagnosis not present

## 2016-12-07 DIAGNOSIS — F419 Anxiety disorder, unspecified: Secondary | ICD-10-CM | POA: Diagnosis not present

## 2016-12-07 DIAGNOSIS — G4700 Insomnia, unspecified: Secondary | ICD-10-CM | POA: Diagnosis not present

## 2016-12-07 DIAGNOSIS — K5900 Constipation, unspecified: Secondary | ICD-10-CM | POA: Diagnosis not present

## 2016-12-07 DIAGNOSIS — D649 Anemia, unspecified: Secondary | ICD-10-CM | POA: Diagnosis not present

## 2016-12-07 DIAGNOSIS — G629 Polyneuropathy, unspecified: Secondary | ICD-10-CM | POA: Diagnosis not present

## 2016-12-08 DIAGNOSIS — G4700 Insomnia, unspecified: Secondary | ICD-10-CM | POA: Diagnosis not present

## 2016-12-08 DIAGNOSIS — J441 Chronic obstructive pulmonary disease with (acute) exacerbation: Secondary | ICD-10-CM | POA: Diagnosis not present

## 2016-12-08 DIAGNOSIS — G629 Polyneuropathy, unspecified: Secondary | ICD-10-CM | POA: Diagnosis not present

## 2016-12-08 DIAGNOSIS — Z515 Encounter for palliative care: Secondary | ICD-10-CM | POA: Diagnosis not present

## 2016-12-08 DIAGNOSIS — D649 Anemia, unspecified: Secondary | ICD-10-CM | POA: Diagnosis not present

## 2016-12-08 DIAGNOSIS — F419 Anxiety disorder, unspecified: Secondary | ICD-10-CM | POA: Diagnosis not present

## 2016-12-08 DIAGNOSIS — K5900 Constipation, unspecified: Secondary | ICD-10-CM | POA: Diagnosis not present

## 2016-12-08 DIAGNOSIS — K219 Gastro-esophageal reflux disease without esophagitis: Secondary | ICD-10-CM | POA: Diagnosis not present

## 2016-12-10 DIAGNOSIS — D649 Anemia, unspecified: Secondary | ICD-10-CM | POA: Diagnosis not present

## 2016-12-10 DIAGNOSIS — F419 Anxiety disorder, unspecified: Secondary | ICD-10-CM | POA: Diagnosis not present

## 2016-12-10 DIAGNOSIS — G4700 Insomnia, unspecified: Secondary | ICD-10-CM | POA: Diagnosis not present

## 2016-12-10 DIAGNOSIS — K5900 Constipation, unspecified: Secondary | ICD-10-CM | POA: Diagnosis not present

## 2016-12-10 DIAGNOSIS — G629 Polyneuropathy, unspecified: Secondary | ICD-10-CM | POA: Diagnosis not present

## 2016-12-10 DIAGNOSIS — J441 Chronic obstructive pulmonary disease with (acute) exacerbation: Secondary | ICD-10-CM | POA: Diagnosis not present

## 2016-12-11 DIAGNOSIS — G4700 Insomnia, unspecified: Secondary | ICD-10-CM | POA: Diagnosis not present

## 2016-12-11 DIAGNOSIS — D649 Anemia, unspecified: Secondary | ICD-10-CM | POA: Diagnosis not present

## 2016-12-11 DIAGNOSIS — J441 Chronic obstructive pulmonary disease with (acute) exacerbation: Secondary | ICD-10-CM | POA: Diagnosis not present

## 2016-12-11 DIAGNOSIS — K5900 Constipation, unspecified: Secondary | ICD-10-CM | POA: Diagnosis not present

## 2016-12-11 DIAGNOSIS — F419 Anxiety disorder, unspecified: Secondary | ICD-10-CM | POA: Diagnosis not present

## 2016-12-11 DIAGNOSIS — G629 Polyneuropathy, unspecified: Secondary | ICD-10-CM | POA: Diagnosis not present

## 2016-12-12 DIAGNOSIS — F419 Anxiety disorder, unspecified: Secondary | ICD-10-CM | POA: Diagnosis not present

## 2016-12-12 DIAGNOSIS — K5900 Constipation, unspecified: Secondary | ICD-10-CM | POA: Diagnosis not present

## 2016-12-12 DIAGNOSIS — G629 Polyneuropathy, unspecified: Secondary | ICD-10-CM | POA: Diagnosis not present

## 2016-12-12 DIAGNOSIS — G4700 Insomnia, unspecified: Secondary | ICD-10-CM | POA: Diagnosis not present

## 2016-12-12 DIAGNOSIS — J441 Chronic obstructive pulmonary disease with (acute) exacerbation: Secondary | ICD-10-CM | POA: Diagnosis not present

## 2016-12-12 DIAGNOSIS — D649 Anemia, unspecified: Secondary | ICD-10-CM | POA: Diagnosis not present

## 2016-12-13 DIAGNOSIS — G4700 Insomnia, unspecified: Secondary | ICD-10-CM | POA: Diagnosis not present

## 2016-12-13 DIAGNOSIS — D649 Anemia, unspecified: Secondary | ICD-10-CM | POA: Diagnosis not present

## 2016-12-13 DIAGNOSIS — G629 Polyneuropathy, unspecified: Secondary | ICD-10-CM | POA: Diagnosis not present

## 2016-12-13 DIAGNOSIS — K5900 Constipation, unspecified: Secondary | ICD-10-CM | POA: Diagnosis not present

## 2016-12-13 DIAGNOSIS — J441 Chronic obstructive pulmonary disease with (acute) exacerbation: Secondary | ICD-10-CM | POA: Diagnosis not present

## 2016-12-13 DIAGNOSIS — F419 Anxiety disorder, unspecified: Secondary | ICD-10-CM | POA: Diagnosis not present

## 2016-12-14 DIAGNOSIS — D649 Anemia, unspecified: Secondary | ICD-10-CM | POA: Diagnosis not present

## 2016-12-14 DIAGNOSIS — G4700 Insomnia, unspecified: Secondary | ICD-10-CM | POA: Diagnosis not present

## 2016-12-14 DIAGNOSIS — J441 Chronic obstructive pulmonary disease with (acute) exacerbation: Secondary | ICD-10-CM | POA: Diagnosis not present

## 2016-12-14 DIAGNOSIS — F419 Anxiety disorder, unspecified: Secondary | ICD-10-CM | POA: Diagnosis not present

## 2016-12-14 DIAGNOSIS — K5900 Constipation, unspecified: Secondary | ICD-10-CM | POA: Diagnosis not present

## 2016-12-14 DIAGNOSIS — G629 Polyneuropathy, unspecified: Secondary | ICD-10-CM | POA: Diagnosis not present

## 2016-12-17 DIAGNOSIS — G629 Polyneuropathy, unspecified: Secondary | ICD-10-CM | POA: Diagnosis not present

## 2016-12-17 DIAGNOSIS — F419 Anxiety disorder, unspecified: Secondary | ICD-10-CM | POA: Diagnosis not present

## 2016-12-17 DIAGNOSIS — D649 Anemia, unspecified: Secondary | ICD-10-CM | POA: Diagnosis not present

## 2016-12-17 DIAGNOSIS — G4700 Insomnia, unspecified: Secondary | ICD-10-CM | POA: Diagnosis not present

## 2016-12-17 DIAGNOSIS — K5900 Constipation, unspecified: Secondary | ICD-10-CM | POA: Diagnosis not present

## 2016-12-17 DIAGNOSIS — J441 Chronic obstructive pulmonary disease with (acute) exacerbation: Secondary | ICD-10-CM | POA: Diagnosis not present

## 2016-12-18 DIAGNOSIS — J441 Chronic obstructive pulmonary disease with (acute) exacerbation: Secondary | ICD-10-CM | POA: Diagnosis not present

## 2016-12-18 DIAGNOSIS — G4700 Insomnia, unspecified: Secondary | ICD-10-CM | POA: Diagnosis not present

## 2016-12-18 DIAGNOSIS — G629 Polyneuropathy, unspecified: Secondary | ICD-10-CM | POA: Diagnosis not present

## 2016-12-18 DIAGNOSIS — K5900 Constipation, unspecified: Secondary | ICD-10-CM | POA: Diagnosis not present

## 2016-12-18 DIAGNOSIS — F419 Anxiety disorder, unspecified: Secondary | ICD-10-CM | POA: Diagnosis not present

## 2016-12-18 DIAGNOSIS — D649 Anemia, unspecified: Secondary | ICD-10-CM | POA: Diagnosis not present

## 2016-12-19 DIAGNOSIS — F419 Anxiety disorder, unspecified: Secondary | ICD-10-CM | POA: Diagnosis not present

## 2016-12-19 DIAGNOSIS — K5900 Constipation, unspecified: Secondary | ICD-10-CM | POA: Diagnosis not present

## 2016-12-19 DIAGNOSIS — D649 Anemia, unspecified: Secondary | ICD-10-CM | POA: Diagnosis not present

## 2016-12-19 DIAGNOSIS — G4700 Insomnia, unspecified: Secondary | ICD-10-CM | POA: Diagnosis not present

## 2016-12-19 DIAGNOSIS — J441 Chronic obstructive pulmonary disease with (acute) exacerbation: Secondary | ICD-10-CM | POA: Diagnosis not present

## 2016-12-19 DIAGNOSIS — G629 Polyneuropathy, unspecified: Secondary | ICD-10-CM | POA: Diagnosis not present

## 2016-12-20 DIAGNOSIS — J441 Chronic obstructive pulmonary disease with (acute) exacerbation: Secondary | ICD-10-CM | POA: Diagnosis not present

## 2016-12-20 DIAGNOSIS — F419 Anxiety disorder, unspecified: Secondary | ICD-10-CM | POA: Diagnosis not present

## 2016-12-20 DIAGNOSIS — D649 Anemia, unspecified: Secondary | ICD-10-CM | POA: Diagnosis not present

## 2016-12-20 DIAGNOSIS — G4700 Insomnia, unspecified: Secondary | ICD-10-CM | POA: Diagnosis not present

## 2016-12-20 DIAGNOSIS — K5900 Constipation, unspecified: Secondary | ICD-10-CM | POA: Diagnosis not present

## 2016-12-20 DIAGNOSIS — G629 Polyneuropathy, unspecified: Secondary | ICD-10-CM | POA: Diagnosis not present

## 2016-12-24 DIAGNOSIS — J441 Chronic obstructive pulmonary disease with (acute) exacerbation: Secondary | ICD-10-CM | POA: Diagnosis not present

## 2016-12-24 DIAGNOSIS — K5900 Constipation, unspecified: Secondary | ICD-10-CM | POA: Diagnosis not present

## 2016-12-24 DIAGNOSIS — G4700 Insomnia, unspecified: Secondary | ICD-10-CM | POA: Diagnosis not present

## 2016-12-24 DIAGNOSIS — D649 Anemia, unspecified: Secondary | ICD-10-CM | POA: Diagnosis not present

## 2016-12-24 DIAGNOSIS — F419 Anxiety disorder, unspecified: Secondary | ICD-10-CM | POA: Diagnosis not present

## 2016-12-24 DIAGNOSIS — G629 Polyneuropathy, unspecified: Secondary | ICD-10-CM | POA: Diagnosis not present

## 2016-12-25 DIAGNOSIS — G629 Polyneuropathy, unspecified: Secondary | ICD-10-CM | POA: Diagnosis not present

## 2016-12-25 DIAGNOSIS — F419 Anxiety disorder, unspecified: Secondary | ICD-10-CM | POA: Diagnosis not present

## 2016-12-25 DIAGNOSIS — G4700 Insomnia, unspecified: Secondary | ICD-10-CM | POA: Diagnosis not present

## 2016-12-25 DIAGNOSIS — D649 Anemia, unspecified: Secondary | ICD-10-CM | POA: Diagnosis not present

## 2016-12-25 DIAGNOSIS — J441 Chronic obstructive pulmonary disease with (acute) exacerbation: Secondary | ICD-10-CM | POA: Diagnosis not present

## 2016-12-25 DIAGNOSIS — K5900 Constipation, unspecified: Secondary | ICD-10-CM | POA: Diagnosis not present

## 2016-12-26 DIAGNOSIS — K5900 Constipation, unspecified: Secondary | ICD-10-CM | POA: Diagnosis not present

## 2016-12-26 DIAGNOSIS — F419 Anxiety disorder, unspecified: Secondary | ICD-10-CM | POA: Diagnosis not present

## 2016-12-26 DIAGNOSIS — G629 Polyneuropathy, unspecified: Secondary | ICD-10-CM | POA: Diagnosis not present

## 2016-12-26 DIAGNOSIS — J441 Chronic obstructive pulmonary disease with (acute) exacerbation: Secondary | ICD-10-CM | POA: Diagnosis not present

## 2016-12-26 DIAGNOSIS — G4700 Insomnia, unspecified: Secondary | ICD-10-CM | POA: Diagnosis not present

## 2016-12-26 DIAGNOSIS — D649 Anemia, unspecified: Secondary | ICD-10-CM | POA: Diagnosis not present

## 2016-12-27 DIAGNOSIS — F419 Anxiety disorder, unspecified: Secondary | ICD-10-CM | POA: Diagnosis not present

## 2016-12-27 DIAGNOSIS — G4700 Insomnia, unspecified: Secondary | ICD-10-CM | POA: Diagnosis not present

## 2016-12-27 DIAGNOSIS — K5900 Constipation, unspecified: Secondary | ICD-10-CM | POA: Diagnosis not present

## 2016-12-27 DIAGNOSIS — D649 Anemia, unspecified: Secondary | ICD-10-CM | POA: Diagnosis not present

## 2016-12-27 DIAGNOSIS — J441 Chronic obstructive pulmonary disease with (acute) exacerbation: Secondary | ICD-10-CM | POA: Diagnosis not present

## 2016-12-27 DIAGNOSIS — G629 Polyneuropathy, unspecified: Secondary | ICD-10-CM | POA: Diagnosis not present

## 2016-12-28 DIAGNOSIS — G4700 Insomnia, unspecified: Secondary | ICD-10-CM | POA: Diagnosis not present

## 2016-12-28 DIAGNOSIS — D649 Anemia, unspecified: Secondary | ICD-10-CM | POA: Diagnosis not present

## 2016-12-28 DIAGNOSIS — J441 Chronic obstructive pulmonary disease with (acute) exacerbation: Secondary | ICD-10-CM | POA: Diagnosis not present

## 2016-12-28 DIAGNOSIS — K5900 Constipation, unspecified: Secondary | ICD-10-CM | POA: Diagnosis not present

## 2016-12-28 DIAGNOSIS — F419 Anxiety disorder, unspecified: Secondary | ICD-10-CM | POA: Diagnosis not present

## 2016-12-28 DIAGNOSIS — G629 Polyneuropathy, unspecified: Secondary | ICD-10-CM | POA: Diagnosis not present

## 2016-12-31 DIAGNOSIS — F419 Anxiety disorder, unspecified: Secondary | ICD-10-CM | POA: Diagnosis not present

## 2016-12-31 DIAGNOSIS — G629 Polyneuropathy, unspecified: Secondary | ICD-10-CM | POA: Diagnosis not present

## 2016-12-31 DIAGNOSIS — K5900 Constipation, unspecified: Secondary | ICD-10-CM | POA: Diagnosis not present

## 2016-12-31 DIAGNOSIS — G4700 Insomnia, unspecified: Secondary | ICD-10-CM | POA: Diagnosis not present

## 2016-12-31 DIAGNOSIS — J441 Chronic obstructive pulmonary disease with (acute) exacerbation: Secondary | ICD-10-CM | POA: Diagnosis not present

## 2016-12-31 DIAGNOSIS — D649 Anemia, unspecified: Secondary | ICD-10-CM | POA: Diagnosis not present

## 2017-01-01 DIAGNOSIS — G4700 Insomnia, unspecified: Secondary | ICD-10-CM | POA: Diagnosis not present

## 2017-01-01 DIAGNOSIS — G629 Polyneuropathy, unspecified: Secondary | ICD-10-CM | POA: Diagnosis not present

## 2017-01-01 DIAGNOSIS — J441 Chronic obstructive pulmonary disease with (acute) exacerbation: Secondary | ICD-10-CM | POA: Diagnosis not present

## 2017-01-01 DIAGNOSIS — D649 Anemia, unspecified: Secondary | ICD-10-CM | POA: Diagnosis not present

## 2017-01-01 DIAGNOSIS — K5900 Constipation, unspecified: Secondary | ICD-10-CM | POA: Diagnosis not present

## 2017-01-01 DIAGNOSIS — F419 Anxiety disorder, unspecified: Secondary | ICD-10-CM | POA: Diagnosis not present

## 2017-01-02 DIAGNOSIS — G4700 Insomnia, unspecified: Secondary | ICD-10-CM | POA: Diagnosis not present

## 2017-01-02 DIAGNOSIS — K5900 Constipation, unspecified: Secondary | ICD-10-CM | POA: Diagnosis not present

## 2017-01-02 DIAGNOSIS — F419 Anxiety disorder, unspecified: Secondary | ICD-10-CM | POA: Diagnosis not present

## 2017-01-02 DIAGNOSIS — J441 Chronic obstructive pulmonary disease with (acute) exacerbation: Secondary | ICD-10-CM | POA: Diagnosis not present

## 2017-01-02 DIAGNOSIS — D649 Anemia, unspecified: Secondary | ICD-10-CM | POA: Diagnosis not present

## 2017-01-02 DIAGNOSIS — G629 Polyneuropathy, unspecified: Secondary | ICD-10-CM | POA: Diagnosis not present

## 2017-01-04 DIAGNOSIS — G4700 Insomnia, unspecified: Secondary | ICD-10-CM | POA: Diagnosis not present

## 2017-01-04 DIAGNOSIS — F419 Anxiety disorder, unspecified: Secondary | ICD-10-CM | POA: Diagnosis not present

## 2017-01-04 DIAGNOSIS — K5900 Constipation, unspecified: Secondary | ICD-10-CM | POA: Diagnosis not present

## 2017-01-04 DIAGNOSIS — D649 Anemia, unspecified: Secondary | ICD-10-CM | POA: Diagnosis not present

## 2017-01-04 DIAGNOSIS — J441 Chronic obstructive pulmonary disease with (acute) exacerbation: Secondary | ICD-10-CM | POA: Diagnosis not present

## 2017-01-04 DIAGNOSIS — G629 Polyneuropathy, unspecified: Secondary | ICD-10-CM | POA: Diagnosis not present

## 2017-01-05 DIAGNOSIS — G629 Polyneuropathy, unspecified: Secondary | ICD-10-CM | POA: Diagnosis not present

## 2017-01-05 DIAGNOSIS — F419 Anxiety disorder, unspecified: Secondary | ICD-10-CM | POA: Diagnosis not present

## 2017-01-05 DIAGNOSIS — G4700 Insomnia, unspecified: Secondary | ICD-10-CM | POA: Diagnosis not present

## 2017-01-05 DIAGNOSIS — D649 Anemia, unspecified: Secondary | ICD-10-CM | POA: Diagnosis not present

## 2017-01-05 DIAGNOSIS — K5900 Constipation, unspecified: Secondary | ICD-10-CM | POA: Diagnosis not present

## 2017-01-05 DIAGNOSIS — J441 Chronic obstructive pulmonary disease with (acute) exacerbation: Secondary | ICD-10-CM | POA: Diagnosis not present

## 2017-01-06 DIAGNOSIS — J441 Chronic obstructive pulmonary disease with (acute) exacerbation: Secondary | ICD-10-CM | POA: Diagnosis not present

## 2017-01-06 DIAGNOSIS — D649 Anemia, unspecified: Secondary | ICD-10-CM | POA: Diagnosis not present

## 2017-01-06 DIAGNOSIS — K5900 Constipation, unspecified: Secondary | ICD-10-CM | POA: Diagnosis not present

## 2017-01-06 DIAGNOSIS — G629 Polyneuropathy, unspecified: Secondary | ICD-10-CM | POA: Diagnosis not present

## 2017-01-06 DIAGNOSIS — F419 Anxiety disorder, unspecified: Secondary | ICD-10-CM | POA: Diagnosis not present

## 2017-01-06 DIAGNOSIS — G4700 Insomnia, unspecified: Secondary | ICD-10-CM | POA: Diagnosis not present

## 2017-01-07 DIAGNOSIS — K219 Gastro-esophageal reflux disease without esophagitis: Secondary | ICD-10-CM | POA: Diagnosis not present

## 2017-01-07 DIAGNOSIS — J441 Chronic obstructive pulmonary disease with (acute) exacerbation: Secondary | ICD-10-CM | POA: Diagnosis not present

## 2017-01-07 DIAGNOSIS — Z515 Encounter for palliative care: Secondary | ICD-10-CM | POA: Diagnosis not present

## 2017-01-07 DIAGNOSIS — G629 Polyneuropathy, unspecified: Secondary | ICD-10-CM | POA: Diagnosis not present

## 2017-01-07 DIAGNOSIS — K5900 Constipation, unspecified: Secondary | ICD-10-CM | POA: Diagnosis not present

## 2017-01-07 DIAGNOSIS — D649 Anemia, unspecified: Secondary | ICD-10-CM | POA: Diagnosis not present

## 2017-01-07 DIAGNOSIS — G4700 Insomnia, unspecified: Secondary | ICD-10-CM | POA: Diagnosis not present

## 2017-01-07 DIAGNOSIS — F419 Anxiety disorder, unspecified: Secondary | ICD-10-CM | POA: Diagnosis not present

## 2017-01-08 DIAGNOSIS — Z515 Encounter for palliative care: Secondary | ICD-10-CM | POA: Diagnosis not present

## 2017-01-08 DIAGNOSIS — G9340 Encephalopathy, unspecified: Secondary | ICD-10-CM | POA: Diagnosis not present

## 2017-01-08 DIAGNOSIS — R4182 Altered mental status, unspecified: Secondary | ICD-10-CM | POA: Diagnosis not present

## 2017-01-08 DIAGNOSIS — R402343 Coma scale, best motor response, flexion withdrawal, at hospital admission: Secondary | ICD-10-CM | POA: Diagnosis not present

## 2017-01-08 DIAGNOSIS — I4510 Unspecified right bundle-branch block: Secondary | ICD-10-CM | POA: Diagnosis present

## 2017-01-08 DIAGNOSIS — J441 Chronic obstructive pulmonary disease with (acute) exacerbation: Secondary | ICD-10-CM | POA: Diagnosis present

## 2017-01-08 DIAGNOSIS — Z87891 Personal history of nicotine dependence: Secondary | ICD-10-CM | POA: Diagnosis not present

## 2017-01-08 DIAGNOSIS — Z66 Do not resuscitate: Secondary | ICD-10-CM | POA: Diagnosis present

## 2017-01-08 DIAGNOSIS — F410 Panic disorder [episodic paroxysmal anxiety] without agoraphobia: Secondary | ICD-10-CM | POA: Diagnosis present

## 2017-01-08 DIAGNOSIS — K219 Gastro-esophageal reflux disease without esophagitis: Secondary | ICD-10-CM | POA: Diagnosis present

## 2017-01-08 DIAGNOSIS — J9622 Acute and chronic respiratory failure with hypercapnia: Secondary | ICD-10-CM | POA: Diagnosis not present

## 2017-01-08 DIAGNOSIS — J309 Allergic rhinitis, unspecified: Secondary | ICD-10-CM | POA: Diagnosis present

## 2017-01-08 DIAGNOSIS — K5900 Constipation, unspecified: Secondary | ICD-10-CM | POA: Diagnosis present

## 2017-01-08 DIAGNOSIS — R402123 Coma scale, eyes open, to pain, at hospital admission: Secondary | ICD-10-CM | POA: Diagnosis present

## 2017-01-08 DIAGNOSIS — I10 Essential (primary) hypertension: Secondary | ICD-10-CM | POA: Diagnosis present

## 2017-01-08 DIAGNOSIS — Z8673 Personal history of transient ischemic attack (TIA), and cerebral infarction without residual deficits: Secondary | ICD-10-CM | POA: Diagnosis not present

## 2017-01-08 DIAGNOSIS — R402213 Coma scale, best verbal response, none, at hospital admission: Secondary | ICD-10-CM | POA: Diagnosis present

## 2017-02-07 DEATH — deceased
